# Patient Record
Sex: Male | Born: 1955 | Race: Black or African American | Hispanic: No | Marital: Single | Smoking: Former smoker
Health system: Southern US, Community
[De-identification: ages and names within clinical notes are randomized; demographics above are authoritative.]

## PROBLEM LIST (undated history)

## (undated) DIAGNOSIS — Z720 Tobacco use: Secondary | ICD-10-CM

## (undated) DIAGNOSIS — E785 Hyperlipidemia, unspecified: Secondary | ICD-10-CM

## (undated) DIAGNOSIS — M109 Gout, unspecified: Secondary | ICD-10-CM

## (undated) DIAGNOSIS — R040 Epistaxis: Secondary | ICD-10-CM

## (undated) DIAGNOSIS — I5042 Chronic combined systolic (congestive) and diastolic (congestive) heart failure: Secondary | ICD-10-CM

## (undated) DIAGNOSIS — F1010 Alcohol abuse, uncomplicated: Secondary | ICD-10-CM

## (undated) DIAGNOSIS — I4891 Unspecified atrial fibrillation: Secondary | ICD-10-CM

## (undated) DIAGNOSIS — I2510 Atherosclerotic heart disease of native coronary artery without angina pectoris: Secondary | ICD-10-CM

## (undated) DIAGNOSIS — I10 Essential (primary) hypertension: Secondary | ICD-10-CM

## (undated) DIAGNOSIS — I255 Ischemic cardiomyopathy: Secondary | ICD-10-CM

## (undated) DIAGNOSIS — I219 Acute myocardial infarction, unspecified: Secondary | ICD-10-CM

## (undated) HISTORY — DX: Atherosclerotic heart disease of native coronary artery without angina pectoris: I25.10

## (undated) HISTORY — DX: Alcohol abuse, uncomplicated: F10.10

## (undated) HISTORY — DX: Tobacco use: Z72.0

## (undated) HISTORY — DX: Gout, unspecified: M10.9

## (undated) HISTORY — PX: TONSILLECTOMY: SUR1361

## (undated) HISTORY — PX: ADENOIDECTOMY: SUR15

## (undated) HISTORY — PX: OTHER SURGICAL HISTORY: SHX169

## (undated) HISTORY — DX: Unspecified atrial fibrillation: I48.91

---

## 1998-05-12 ENCOUNTER — Emergency Department (HOSPITAL_COMMUNITY): Admission: EM | Admit: 1998-05-12 | Discharge: 1998-05-12 | Payer: Self-pay | Admitting: Emergency Medicine

## 1998-05-12 ENCOUNTER — Encounter: Payer: Self-pay | Admitting: Emergency Medicine

## 1998-06-02 ENCOUNTER — Emergency Department (HOSPITAL_COMMUNITY): Admission: EM | Admit: 1998-06-02 | Discharge: 1998-06-02 | Payer: Self-pay | Admitting: Emergency Medicine

## 1998-06-02 ENCOUNTER — Encounter: Payer: Self-pay | Admitting: Emergency Medicine

## 2006-02-20 ENCOUNTER — Emergency Department (HOSPITAL_COMMUNITY): Admission: EM | Admit: 2006-02-20 | Discharge: 2006-02-20 | Payer: Self-pay | Admitting: Emergency Medicine

## 2007-06-16 ENCOUNTER — Emergency Department (HOSPITAL_COMMUNITY): Admission: EM | Admit: 2007-06-16 | Discharge: 2007-06-16 | Payer: Self-pay | Admitting: Emergency Medicine

## 2008-11-15 ENCOUNTER — Emergency Department (HOSPITAL_COMMUNITY): Admission: EM | Admit: 2008-11-15 | Discharge: 2008-11-15 | Payer: Self-pay | Admitting: Emergency Medicine

## 2008-12-31 ENCOUNTER — Ambulatory Visit: Payer: Self-pay | Admitting: Family Medicine

## 2008-12-31 ENCOUNTER — Emergency Department (HOSPITAL_COMMUNITY): Admission: EM | Admit: 2008-12-31 | Discharge: 2008-12-31 | Payer: Self-pay | Admitting: Emergency Medicine

## 2009-05-03 ENCOUNTER — Emergency Department (HOSPITAL_COMMUNITY): Admission: EM | Admit: 2009-05-03 | Discharge: 2009-05-03 | Payer: Self-pay | Admitting: Emergency Medicine

## 2009-05-08 ENCOUNTER — Emergency Department (HOSPITAL_COMMUNITY): Admission: EM | Admit: 2009-05-08 | Discharge: 2009-05-08 | Payer: Self-pay | Admitting: Emergency Medicine

## 2009-05-09 ENCOUNTER — Emergency Department (HOSPITAL_COMMUNITY): Admission: EM | Admit: 2009-05-09 | Discharge: 2009-05-09 | Payer: Self-pay | Admitting: Emergency Medicine

## 2009-07-18 ENCOUNTER — Emergency Department (HOSPITAL_COMMUNITY): Admission: EM | Admit: 2009-07-18 | Discharge: 2009-07-19 | Payer: Self-pay | Admitting: Emergency Medicine

## 2009-10-28 ENCOUNTER — Emergency Department (HOSPITAL_COMMUNITY): Admission: EM | Admit: 2009-10-28 | Discharge: 2009-10-28 | Payer: Self-pay | Admitting: Emergency Medicine

## 2009-12-07 ENCOUNTER — Ambulatory Visit (HOSPITAL_COMMUNITY): Admission: RE | Admit: 2009-12-07 | Discharge: 2009-12-07 | Payer: Self-pay | Admitting: Family Medicine

## 2010-03-03 ENCOUNTER — Encounter: Payer: Self-pay | Admitting: Neurology

## 2010-04-29 LAB — DIFFERENTIAL
Basophils Absolute: 0.1 10*3/uL (ref 0.0–0.1)
Eosinophils Absolute: 0 10*3/uL (ref 0.0–0.7)
Eosinophils Relative: 0 % (ref 0–5)
Lymphs Abs: 1.8 10*3/uL (ref 0.7–4.0)
Neutrophils Relative %: 75 % (ref 43–77)

## 2010-04-29 LAB — CBC
MCV: 91.1 fL (ref 78.0–100.0)
Platelets: 219 10*3/uL (ref 150–400)
RDW: 16.4 % — ABNORMAL HIGH (ref 11.5–15.5)
WBC: 12.1 10*3/uL — ABNORMAL HIGH (ref 4.0–10.5)

## 2010-04-29 LAB — POCT I-STAT, CHEM 8
BUN: 12 mg/dL (ref 6–23)
Calcium, Ion: 1.11 mmol/L — ABNORMAL LOW (ref 1.12–1.32)
HCT: 46 % (ref 39.0–52.0)
Hemoglobin: 15.6 g/dL (ref 13.0–17.0)
Sodium: 134 mEq/L — ABNORMAL LOW (ref 135–145)
TCO2: 32 mmol/L (ref 0–100)

## 2010-04-29 LAB — URINE MICROSCOPIC-ADD ON

## 2010-04-29 LAB — URINALYSIS, ROUTINE W REFLEX MICROSCOPIC
Glucose, UA: 100 mg/dL — AB
Ketones, ur: 15 mg/dL — AB
Leukocytes, UA: NEGATIVE
Nitrite: NEGATIVE
Protein, ur: 100 mg/dL — AB
Urobilinogen, UA: 4 mg/dL — ABNORMAL HIGH (ref 0.0–1.0)

## 2010-04-29 LAB — URIC ACID: Uric Acid, Serum: 8.9 mg/dL — ABNORMAL HIGH (ref 4.0–7.8)

## 2010-05-06 LAB — BASIC METABOLIC PANEL
CO2: 29 mEq/L (ref 19–32)
Calcium: 9.7 mg/dL (ref 8.4–10.5)
Creatinine, Ser: 0.82 mg/dL (ref 0.4–1.5)
GFR calc Af Amer: >60 mL/min (ref >60)
GFR calc non Af Amer: >60 mL/min (ref >60)
Glucose, Bld: 144 mg/dL — ABNORMAL HIGH (ref 70–99)

## 2010-05-06 LAB — POCT I-STAT, CHEM 8
BUN: 26 mg/dL — ABNORMAL HIGH (ref 6–23)
Calcium, Ion: 1.09 mmol/L — ABNORMAL LOW (ref 1.12–1.32)
Creatinine, Ser: 0.8 mg/dL (ref 0.4–1.5)
Hemoglobin: 14.6 g/dL (ref 13.0–17.0)
Sodium: 136 mEq/L (ref 135–145)
TCO2: 30 mmol/L (ref 0–100)

## 2010-05-06 LAB — DIFFERENTIAL
Basophils Absolute: 0 10*3/uL (ref 0.0–0.1)
Basophils Absolute: 0.1 10*3/uL (ref 0.0–0.1)
Basophils Relative: 0 % (ref 0–1)
Eosinophils Absolute: 0.1 10*3/uL (ref 0.0–0.7)
Eosinophils Relative: 1 % (ref 0–5)
Lymphocytes Relative: 26 % (ref 12–46)
Lymphocytes Relative: 28 % (ref 12–46)
Monocytes Absolute: 1.4 10*3/uL — ABNORMAL HIGH (ref 0.1–1.0)
Neutro Abs: 5.3 10*3/uL (ref 1.7–7.7)
Neutrophils Relative %: 62 % (ref 43–77)

## 2010-05-06 LAB — CBC
HCT: 42.9 % (ref 39.0–52.0)
Hemoglobin: 14 g/dL (ref 13.0–17.0)
MCHC: 32.6 g/dL (ref 30.0–36.0)
MCHC: 33.3 g/dL (ref 30.0–36.0)
MCV: 91.4 fL (ref 78.0–100.0)
Platelets: 214 10*3/uL (ref 150–400)
RDW: 14.4 % (ref 11.5–15.5)
RDW: 14.5 % (ref 11.5–15.5)

## 2010-05-15 LAB — COMPREHENSIVE METABOLIC PANEL
ALT: 17 U/L (ref 0–53)
Alkaline Phosphatase: 72 U/L (ref 39–117)
CO2: 26 mEq/L (ref 19–32)
Chloride: 101 mEq/L (ref 96–112)
GFR calc non Af Amer: >60 mL/min (ref >60)
Glucose, Bld: 138 mg/dL — ABNORMAL HIGH (ref 70–99)
Potassium: 3.9 mEq/L (ref 3.5–5.1)
Sodium: 136 mEq/L (ref 135–145)
Total Bilirubin: 0.4 mg/dL (ref 0.3–1.2)
Total Protein: 7.5 g/dL (ref 6.0–8.3)

## 2010-05-15 LAB — DIFFERENTIAL
Basophils Absolute: 0 10*3/uL (ref 0.0–0.1)
Basophils Relative: 0 % (ref 0–1)
Eosinophils Absolute: 0.1 10*3/uL (ref 0.0–0.7)
Monocytes Relative: 9 % (ref 3–12)
Neutrophils Relative %: 73 % (ref 43–77)

## 2010-05-15 LAB — CBC
Hemoglobin: 13.4 g/dL (ref 13.0–17.0)
RBC: 4.49 MIL/uL (ref 4.22–5.81)
WBC: 8.3 10*3/uL (ref 4.0–10.5)

## 2010-09-11 ENCOUNTER — Emergency Department (HOSPITAL_COMMUNITY): Payer: Managed Care, Other (non HMO)

## 2010-09-11 ENCOUNTER — Observation Stay (HOSPITAL_COMMUNITY)
Admission: EM | Admit: 2010-09-11 | Discharge: 2010-09-12 | Disposition: A | Discharge status: Home / Self Care | Payer: Managed Care, Other (non HMO) | Attending: Internal Medicine | Admitting: Internal Medicine

## 2010-09-11 DIAGNOSIS — E119 Type 2 diabetes mellitus without complications: Secondary | ICD-10-CM | POA: Insufficient documentation

## 2010-09-11 DIAGNOSIS — R55 Syncope and collapse: Principal | ICD-10-CM | POA: Insufficient documentation

## 2010-09-11 DIAGNOSIS — I10 Essential (primary) hypertension: Secondary | ICD-10-CM | POA: Insufficient documentation

## 2010-09-11 DIAGNOSIS — M6282 Rhabdomyolysis: Secondary | ICD-10-CM | POA: Insufficient documentation

## 2010-09-11 DIAGNOSIS — Z9181 History of falling: Secondary | ICD-10-CM | POA: Insufficient documentation

## 2010-09-11 DIAGNOSIS — I517 Cardiomegaly: Secondary | ICD-10-CM | POA: Insufficient documentation

## 2010-09-11 DIAGNOSIS — I4949 Other premature depolarization: Secondary | ICD-10-CM | POA: Insufficient documentation

## 2010-09-11 DIAGNOSIS — F17200 Nicotine dependence, unspecified, uncomplicated: Secondary | ICD-10-CM | POA: Insufficient documentation

## 2010-09-11 DIAGNOSIS — F1020 Alcohol dependence, uncomplicated: Secondary | ICD-10-CM | POA: Insufficient documentation

## 2010-09-11 LAB — POCT I-STAT, CHEM 8
Glucose, Bld: 135 mg/dL — ABNORMAL HIGH (ref 70–99)
HCT: 48 % (ref 39.0–52.0)
Hemoglobin: 16.3 g/dL (ref 13.0–17.0)
Potassium: 4.1 mEq/L (ref 3.5–5.1)

## 2010-09-11 LAB — CBC
Platelets: 207 10*3/uL (ref 150–400)
RDW: 13.9 % (ref 11.5–15.5)
WBC: 7.2 10*3/uL (ref 4.0–10.5)

## 2010-09-11 LAB — GLUCOSE, CAPILLARY: Comment 1: 2525

## 2010-09-11 LAB — DIFFERENTIAL
Basophils Absolute: 0 10*3/uL (ref 0.0–0.1)
Eosinophils Absolute: 0.1 10*3/uL (ref 0.0–0.7)
Eosinophils Relative: 1 % (ref 0–5)
Lymphocytes Relative: 30 % (ref 12–46)

## 2010-09-11 LAB — ETHANOL: Alcohol, Ethyl (B): <11 mg/dL (ref 0–11)

## 2010-09-12 ENCOUNTER — Inpatient Hospital Stay (HOSPITAL_COMMUNITY): Payer: Managed Care, Other (non HMO)

## 2010-09-12 LAB — GLUCOSE, CAPILLARY
Glucose-Capillary: 90 mg/dL (ref 70–99)
Glucose-Capillary: 156 mg/dL — ABNORMAL HIGH (ref 70–99)
Glucose-Capillary: 89 mg/dL (ref 70–99)

## 2010-09-12 LAB — URINALYSIS, ROUTINE W REFLEX MICROSCOPIC
Leukocytes, UA: NEGATIVE
Nitrite: NEGATIVE
Specific Gravity, Urine: 1.009 (ref 1.005–1.030)
Urobilinogen, UA: 1 mg/dL (ref 0.0–1.0)

## 2010-09-12 LAB — BASIC METABOLIC PANEL
BUN: 14 mg/dL (ref 6–23)
Chloride: 104 mEq/L (ref 96–112)
Glucose, Bld: 121 mg/dL — ABNORMAL HIGH (ref 70–99)
Potassium: 3.7 mEq/L (ref 3.5–5.1)

## 2010-09-12 LAB — CBC
HCT: 39.8 % (ref 39.0–52.0)
Hemoglobin: 13.2 g/dL (ref 13.0–17.0)
MCH: 30.9 pg (ref 26.0–34.0)
MCHC: 33.4 g/dL (ref 30.0–36.0)

## 2010-09-12 LAB — TSH: TSH: 2.029 u[IU]/mL (ref 0.350–4.500)

## 2010-09-12 LAB — CARDIAC PANEL(CRET KIN+CKTOT+MB+TROPI): CK, MB: 6.5 ng/mL (ref 0.3–4.0)

## 2010-09-15 NOTE — Discharge Summary (Signed)
  Timothy Olsen, HICKLIN NO.:  [id number]  MEDICAL RECORD NO.:  [id number]  LOCATION:  4735                         FACILITY:  [facility]  PHYSICIAN:  Conley Canal, MD      DATE OF BIRTH:  10/01/1955  DATE OF ADMISSION:  09/11/2010 DATE OF DISCHARGE:  09/12/2010                        DISCHARGE SUMMARY - REFERRING   PRIMARY CARE PHYSICIAN:  Clyda Greener, MD  CONSULTING PHYSICIAN:  Dr. Kristeen Miss, Cardiology.  DISCHARGE DIAGNOSES: 1. Syncopal episode, likely vasovagal, possibility of orthostatic     hypotension and possible arrhythmia. 2. Rhabdomyolysis, related to fall, related to #1. 3. Premature ventricular complexes, chronic, in the setting of     alcoholism and hypomagnesemia. 4. Diabetes mellitus type 2. 5. Accelerated hypertension, poorly controlled.  DISCHARGE MEDICATIONS: 1. Aspirin 81 mg daily. 2. Lopressor 25 mg twice daily. 3. Clonidine 0.2 mg twice daily. 4. Glipizide 5 mg twice daily. 5. Lisinopril 10 mg daily. 6. Metformin 1000 mg twice daily.  PROCEDURES PERFORMED:  Chest x-ray showing cardiomegaly without congestive heart failure and some minimal residual right base subsegmental atelectasis.  HOSPITAL COURSE:  [name] was briefly admitted on August 1 with complaints of syncopal episode preceded by some dizziness.  The thought was that he probably had a presyncopal episode related to orthostatic hypotension as he was volume depleted with some rhabdomyolysis at admission.  The patient was seen by Cardiology who felt that he did not require further cardiac workup.  His CPK was 472, troponin I negative. He was then started on IV fluids.  He had also been on clonidine 0.2 mg twice daily, and this was restarted.  Magnesium level was also 1.4, contributing to PVCs.  He received magnesium sulfate.  His TSH was normal.  At this point, the thinking is that he probably had orthostatic hypotension or a combination of things including PVCs,  dehydration, possibly resulting in orthostatic hypotension.  He was not interested in further workup, but in this admission, his blood pressure was noted to be generally high, possibly a rebound effect of missing some doses of clonidine.  If his baseline blood pressure is this high, he will need to work with his primary care provider to optimize control.  Today, he is being discharged on clonidine 0.2 mg twice daily, Lopressor 25 mg twice daily as well as lisinopril 10 mg daily.  He should follow with Dr. Bruna Potter in the next week.  The time spent for this discharge preparation is less than 30 minutes.     Conley Canal, MD     SR/MEDQ  D:  09/12/2010  T:  09/12/2010  Job:  [id number]  cc:   Clyda Greener, MD  Electronically Signed by Conley Canal  on 09/15/2010 05:46:40 PM

## 2010-09-25 NOTE — Consult Note (Signed)
NAMEALTON, BOUKNIGHT NO.:  [id number]  MEDICAL RECORD NO.:  [id number]  LOCATION:  4735                         FACILITY:  [facility]  PHYSICIAN:  Vesta Mixer, M.D. DATE OF BIRTH:  [birth year]  DATE OF CONSULTATION:  09/11/2010 DATE OF DISCHARGE:                                CONSULTATION   PRIMARY CARE PHYSICIAN:  Dr. Clide Deutscher and the patient is also been seen at [facility].  PRIMARY CARDIOLOGIST:  None.  CHIEF COMPLAINT:  Syncope.  HISTORY OF PRESENT ILLNESS:  Timothy Olsen is a 55 year old male with no previous history of coronary artery disease.  He was at work today as usual but had indulged in tobacco and alcohol this weekend.  He was working and stated that that involved bending down repeatedly.  He developed dizziness with this and then when he went to do something else the dizziness continued.  He then had a brief syncopal episode.  There was no seizure activity witnessed.  No postictal state is described.  He had no palpitations and no chest pain.  His symptoms did not recur.  He was sent to Urgent Care and his EKG appeared abnormal, so they transferred him to [facility].  In the emergency room, Mr. Nix EKG has frequent PACs but no life-threatening arrhythmias.  He is aware of a long history of an irregular heart rate and says he has been evaluated for this before and was told there was nothing to it.  Currently, he is resting comfortably.  This has never happened before.  PAST MEDICAL HISTORY: 1. Diabetes. 2. Hypertension. 3. History of an irregular heart rate. 4. History of left foot cellulitis in November 2010. 5. Family history of coronary artery disease.  SURGICAL HISTORY:  Status post tonsillectomy and left thumb surgery.  ALLERGIES:  No known drug allergies.  CURRENT MEDICATIONS: 1. Clonidine 0.2 mg b.i.d. 2. Glucophage 1000 mg b.i.d. 3. Lisinopril 10 mg a day. 4. Glucotrol 5 mg b.i.d. 5. Lasix 20 mg p.r.n. 6.  Pravachol prescribed but the patient currently not taking.  SOCIAL HISTORY:  He lives in [place] alone.  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  He drinks alcohol and smokes occasionally on the weekends but not every day.  He denies drug abuse.  FAMILY HISTORY:  His mother died of 67 and the patient is not aware of any heart disease.  His father has a history of heart disease and is alive at 55.  He also has at least one sibling with cardiac issues.  REVIEW OF SYSTEMS:  Mr. Atchley has not had any recent illnesses, fevers, or chills.  He does not have any history of chest pain or dyspnea on exertion.  He has chronic left lower extremity edema ever since the cellulitis in 2010.  He does not have numbness or tingling. He has not been weak.  He has occasional arthralgias and joint pains. He rarely has indigestion or heartburn and has no history of melena. Full 14-point review of systems is otherwise negative except as stated in the HPI.  PHYSICAL EXAMINATION:  VITAL SIGNS:  Temperature is 97.9, blood pressure 166/88, heart rate 87, respiratory rate 20, O2 saturation  97% on room air. GENERAL:  He is a well-developed, well-nourished African American male in no acute distress. HEENT:  Normal with the exception of poor dentition. NECK:  There is no lymphadenopathy, thyromegaly, bruit or JVD noted. CV:  His heart is regular in rate and rhythm with an S1-S2 with no significant murmur, rub or gallop is noted.  Distal pulses are intact in all four extremities. LUNGS:  Essentially clear to auscultation bilaterally but he had some basilar rales that cleared with deep inspiration. SKIN:  He has some chronic stasis changes in his lower extremities but no rashes or lesions are noted. ABDOMEN:  Soft and nontender with active bowel sounds. EXTREMITIES:  There is no cyanosis or clubbing and only trace edema on the left. MUSCULOSKELETAL:  There is no joint deformity or effusions and no  spine or CVA tenderness. NEURO:  He is alert and oriented with cranial nerves II-XII grossly intact.  Chest x-ray shows decreased lung volumes with bibasilar airspace disease.  EKG is sinus rhythm rate 82 with multiple PACs.  LABORATORY VALUES:  Hemoglobin 15.2, hematocrit 44.6, WBC 7.2, platelets 207.  Sodium 141, potassium 4.1, chloride 104, BUN 10, creatinine 0.8, glucose 135, CK-MB 472/11.1 with an index of 2.4 and troponin I less than 0.3 with a blood alcohol less than 11.  IMPRESSION:  Mr. Mineer was seen today by Dr. Elease Hashimoto, the patient evaluated and the data reviewed.  He has a history of hypertension who had presyncope and then syncope today.  His symptoms are consistent with orthostatic hypotension.  His enzymes are negative because although the total CK is up, the index is normal, and consistent with volume dehydration.  The troponin is within normal limits.  Therefore, the elevated MB is not felt to be primarily cardiac in origin.  His EKG has left ventricular hypertrophy but no acute ischemic changes.  Mr. Maull may be discharged from a cardiac standpoint.  He is encouraged to follow up with primary MD, and the patient has decided to stop drinking and smoking altogether which he was encouraged to do.  No cardiac followup is indicated at this time but we concur that it would be appropriate for Mr. Grieshop to rest tomorrow and return to work as usual on Friday with no restrictions.     Theodore Demark, PA-C   ______________________________ Vesta Mixer, M.D.    RB/MEDQ  D:  09/11/2010  T:  09/12/2010  Job:  [id number]  Electronically Signed by Theodore Demark PA-C on 09/19/2010 02:11:33 PM Electronically Signed by Kristeen Miss M.D. on 09/25/2010 05:36:28 PM

## 2010-11-07 NOTE — H&P (Signed)
NAMEAMARI, ZAGAL NO.:  [id number]  MEDICAL RECORD NO.:  [id number]  LOCATION:  4735                         FACILITY:  [facility]  PHYSICIAN:  Lonia Blood, M.D.DATE OF BIRTH:  [birth year]  DATE OF ADMISSION:  09/11/2010 DATE OF DISCHARGE:                             HISTORY & PHYSICAL   PRIMARY CARE PHYSICIAN:  Clyda Greener, MD  CHIEF COMPLAINT:  Syncope with preceding dizziness.  HISTORY OF PRESENT ILLNESS:  Timothy Olsen is a 55 year old gentleman with a prior history of diabetes, hypertension, and known "irregular heartbeat."  He has been taking all of his usual prescribed medications and has had no major acute issues of late.  Today, he was working at [employer].  He was bending over and standing backup promptly multiple times in succession.  At one point, after having done so, he immediately developed severe dizziness after standing.  He became quite lightheaded.  Immediately thereafter, he slumped over a counter at work and became unconscious.  Other staff noted his predicament and lowered him to the floor.  He quickly awoke. There was some concern of some convulsions while the patient was unresponsive.  The patient did rapidly awake.  He was alert and oriented after doing so.  He denies any chest pains, fevers, chills, nausea, or vomiting.  The patient originally was taken to a local urgent care. There, they became quite concerned because of his "irregular rhythm," and they send him to the emergency room.  In the emergency room, the ER doctor was concerned about his irregular cardiac rhythm and his syncopal spells and consulted the cardiologist.  The cardiologist evaluated the patient, made note of his EKG, examined the patient, took a complete history, and then suggested that it was safe for the patient to be discharged home.  At this point, the ER then contacted the hospitalist on-call to request direct admission to  the floor.  Due to a significant high volume of admissions, the Hospitalist was not able to evaluate the patient in the emergency room in a timely manner, and he was therefore transferred out to the floor.  At the time of my evaluation, the patient is resting comfortably in his hospital bed on 4735.  He continues to report that he is having no chest pain, shortness of breath, fevers, chills, nausea, or vomiting.  He is not having any dizzy spells at the present time.  He admits to compliance with his medications and does in fact admit that he has been "drinking a little too much beer lately."  He has informed me of his plan to discontinue using alcohol.  REVIEW OF SYSTEMS:  Comprehensive review of systems is accomplished and is unrevealing with the exception to the positive elements noted in the history of present illness above.  PAST MEDICAL HISTORY: 1. Diabetes mellitus type 2. 2. Hypertension. 3. "Irregular heartbeat" - frequent PACs and occasional PVCs noted on     old EKGs and in the ER. 4. Status post tonsillectomy and adenoidectomy.  OUTPATIENT MEDICATIONS: 1. Clonidine 0.2 mg b.i.d. 2. Glucophage 1000 mg b.i.d. 3. Lisinopril 10 mg b.i.d. 4. Glucotrol 5 mg b.i.d.  ALLERGIES:  No known drug allergies.  FAMILY HISTORY:  The patient's mother has passed away at the age of 32, she had no history of coronary artery disease.  The patient's father is alive and well at age 9, but does have a personal history of coronary artery disease.  SOCIAL HISTORY:  The patient works at [employer].  He occasionally partakes of alcohol, but cannot specify to me exactly how much.  He lives alone in the [place] area.  He occasionally smokes but not on a daily basis per his report.  DATA REVIEW:  Sodium, potassium, chloride, bicarb, BUN, and creatinine are normal.  Serum glucose is elevated at 135.  White count, platelet count, and hemoglobin are normal with a normal MCV.  Alcohol  level was less 11.  CK is elevated at 472, CK-MB is elevated at 11.1, troponin is however totally normal.  Chest x-ray reveals bibasilar airspace disease which is most consistent with poor inspiratory effort and atelectasis. A 12-lead EKG reveals sinus rhythm with PVCs.  PHYSICAL EXAMINATION:  VITAL SIGNS:  Temperature 97.9, blood pressure 166/88, heart rate 87, respiratory rate 20, O2 sats 97% on room air. GENERAL:  Well-developed, well-nourished gentleman, in no acute respiratory distress. HEENT:  Normocephalic, atraumatic.  Pupils are equal, round, and reactive to light and accommodation.  Extraocular muscles intact. Bilaterally, OC/OP clear. NECK:  No JVD. LUNGS:  Clear to auscultation bilaterally without wheeze or rhonchi. CARDIOVASCULAR:  Regular rate and rhythm with frequent ectopic beats without gallop or rub with no appreciable murmur. ABDOMEN:  Nontender, nondistended, soft bowel sounds present.  No organomegaly.  No rebound.  No ascites. EXTREMITIES:  There is no significant cyanosis, clubbing, edema in the bilateral lower extremities. NEUROLOGIC:  Nonfocal neurologic exam.  IMPRESSION AND PLAN: 1. Syncopal spell - again, I agree wholeheartedly with Cardiology that     these symptoms are almost consistent with an orthostatic     hypotension, specifically in the setting of frequent postural     changes in a short period of time.  Additionally, the patient is on     clonidine at 0.2 mg b.i.d.  Clonidine is known to exacerbate or     even purely cause orthostatic hypotension.  Additionally, the     patient does appear to be somewhat clinically dehydrated.  We will     hydrate the patient.  We would discontinue his clonidine.  We will     follow him on telemetry overnight and fully anticipate that he will     be prepared for discharge in the morning. 2. Mild myositis - the patient's CK total is elevated at 472.  His MB     is not significantly elevated in the setting of his  total CK     elevation.  His troponin is unrevealing.  He has no symptoms to     suggest unstable angina pectoris.  This is likely simple myositis     related to the patient's syncopal spell.  We will recheck a CK in     the morning to assure that this is trending down and not indicative     of rhabdomyolysis. 3. Premature atrial contractions and premature ventricular     contractions - the patient has a longstanding history per his     report of "irregular heartbeat."  I suspect his chronic arrhythmia     is that of PACs and PVCs.  We will monitor him on telemetry     overnight since he is being observed in the hospital.  Perhaps, the     patient could benefit from an echocardiogram in the outpatient     setting as per his primary care physician. 4. Diabetes mellitus - we will continue the patient's home     medications.  We will administer a diabetic diet.  We will follow     CBCs closely and supplement with sliding scale insulin as     indicated. 5. Hypertension - as discussed above, we will discontinue clonidine     due to its propensity to exacerbate or even cause orthostatic     hypotension.  We will continue lisinopril in this diabetic.  We     will add Norvasc as I do wish to avoid diuretic.     Lonia Blood, M.D.     JTM/MEDQ  D:  09/11/2010  T:  09/11/2010  Job:  [id number]  cc:   Clyda Greener, MD  Electronically Signed by Jetty Duhamel M.D. on 11/07/2010 09:46:18 AM

## 2011-01-06 ENCOUNTER — Encounter: Payer: Self-pay | Admitting: *Deleted

## 2011-01-06 ENCOUNTER — Emergency Department (HOSPITAL_COMMUNITY)
Admission: EM | Admit: 2011-01-06 | Discharge: 2011-01-07 | Disposition: A | Discharge status: Home / Self Care | Payer: Managed Care, Other (non HMO) | Attending: Emergency Medicine | Admitting: Emergency Medicine

## 2011-01-06 DIAGNOSIS — M109 Gout, unspecified: Secondary | ICD-10-CM | POA: Insufficient documentation

## 2011-01-06 DIAGNOSIS — E119 Type 2 diabetes mellitus without complications: Secondary | ICD-10-CM | POA: Insufficient documentation

## 2011-01-06 DIAGNOSIS — E162 Hypoglycemia, unspecified: Secondary | ICD-10-CM

## 2011-01-06 DIAGNOSIS — I10 Essential (primary) hypertension: Secondary | ICD-10-CM | POA: Insufficient documentation

## 2011-01-06 HISTORY — DX: Essential (primary) hypertension: I10

## 2011-01-06 LAB — GLUCOSE, CAPILLARY: Glucose-Capillary: 159 mg/dL — ABNORMAL HIGH (ref 70–99)

## 2011-01-06 NOTE — ED Notes (Signed)
Received pt. From triage with c/o  Bilat. Knee pain and swelling, Bilat. Foot pain, low blood sugar in triage improved with diet

## 2011-01-06 NOTE — ED Notes (Signed)
He has had feet pain and heaviness and he has a pain in his lt knee and his rt elbow for 3-4 weeks.  No known injury and he has been given  Med for gout from his doctor.

## 2011-01-07 MED ORDER — INDOMETHACIN 25 MG PO CAPS: 25.0000 mg | ORAL_CAPSULE | Freq: Three times a day (TID) | ORAL | Status: DC

## 2011-01-09 NOTE — ED Provider Notes (Signed)
Medical screening examination/treatment/procedure(s) were performed by non-physician practitioner and as supervising physician I was immediately available for consultation/collaboration.  Raeford Razor, MD 01/09/11 [phone]

## 2011-01-21 ENCOUNTER — Emergency Department (HOSPITAL_COMMUNITY): Payer: Managed Care, Other (non HMO)

## 2011-01-21 ENCOUNTER — Observation Stay (HOSPITAL_COMMUNITY)
Admission: EM | Admit: 2011-01-21 | Discharge: 2011-01-24 | DRG: 313 | Disposition: A | Discharge status: Home / Self Care | Payer: Managed Care, Other (non HMO) | Attending: Internal Medicine | Admitting: Internal Medicine

## 2011-01-21 ENCOUNTER — Other Ambulatory Visit: Payer: Self-pay

## 2011-01-21 ENCOUNTER — Encounter (HOSPITAL_COMMUNITY): Payer: Self-pay | Admitting: *Deleted

## 2011-01-21 DIAGNOSIS — M2540 Effusion, unspecified joint: Secondary | ICD-10-CM | POA: Diagnosis not present

## 2011-01-21 DIAGNOSIS — I10 Essential (primary) hypertension: Secondary | ICD-10-CM

## 2011-01-21 DIAGNOSIS — E119 Type 2 diabetes mellitus without complications: Secondary | ICD-10-CM

## 2011-01-21 DIAGNOSIS — Z7982 Long term (current) use of aspirin: Secondary | ICD-10-CM

## 2011-01-21 DIAGNOSIS — Z79899 Other long term (current) drug therapy: Secondary | ICD-10-CM | POA: Insufficient documentation

## 2011-01-21 DIAGNOSIS — I2510 Atherosclerotic heart disease of native coronary artery without angina pectoris: Secondary | ICD-10-CM | POA: Diagnosis present

## 2011-01-21 DIAGNOSIS — R079 Chest pain, unspecified: Secondary | ICD-10-CM

## 2011-01-21 DIAGNOSIS — I517 Cardiomegaly: Secondary | ICD-10-CM | POA: Insufficient documentation

## 2011-01-21 DIAGNOSIS — Z87891 Personal history of nicotine dependence: Secondary | ICD-10-CM

## 2011-01-21 DIAGNOSIS — M25469 Effusion, unspecified knee: Secondary | ICD-10-CM | POA: Diagnosis present

## 2011-01-21 DIAGNOSIS — E785 Hyperlipidemia, unspecified: Secondary | ICD-10-CM

## 2011-01-21 DIAGNOSIS — M109 Gout, unspecified: Secondary | ICD-10-CM | POA: Diagnosis present

## 2011-01-21 LAB — POCT I-STAT, CHEM 8
BUN: 10 mg/dL (ref 6–23)
Calcium, Ion: 1.22 mmol/L (ref 1.12–1.32)
Chloride: 101 mEq/L (ref 96–112)
Creatinine, Ser: 0.9 mg/dL (ref 0.50–1.35)
Glucose, Bld: 138 mg/dL — ABNORMAL HIGH (ref 70–99)

## 2011-01-21 LAB — PROTIME-INR
INR: 1.03 (ref 0.00–1.49)
Prothrombin Time: 13.7 seconds (ref 11.6–15.2)

## 2011-01-21 LAB — DIFFERENTIAL
Basophils Absolute: 0 10*3/uL (ref 0.0–0.1)
Basophils Relative: 0 % (ref 0–1)
Lymphocytes Relative: 12 % (ref 12–46)
Neutro Abs: 7.9 10*3/uL — ABNORMAL HIGH (ref 1.7–7.7)
Neutrophils Relative %: 79 % — ABNORMAL HIGH (ref 43–77)

## 2011-01-21 LAB — CBC
Hemoglobin: 14.3 g/dL (ref 13.0–17.0)
MCHC: 33.2 g/dL (ref 30.0–36.0)
RDW: 14.4 % (ref 11.5–15.5)
WBC: 10.1 10*3/uL (ref 4.0–10.5)

## 2011-01-21 LAB — CARDIAC PANEL(CRET KIN+CKTOT+MB+TROPI)
CK, MB: 4.2 ng/mL — ABNORMAL HIGH (ref 0.3–4.0)
Relative Index: 3 — ABNORMAL HIGH (ref 0.0–2.5)
Relative Index: 3.1 — ABNORMAL HIGH (ref 0.0–2.5)
Total CK: 175 U/L (ref 7–232)
Total CK: 134 U/L (ref 7–232)
Troponin I: <0.3 ng/mL (ref <0.30)

## 2011-01-21 LAB — GLUCOSE, CAPILLARY: Glucose-Capillary: 88 mg/dL (ref 70–99)

## 2011-01-21 LAB — APTT: aPTT: 31 seconds (ref 24–37)

## 2011-01-21 LAB — RAPID URINE DRUG SCREEN, HOSP PERFORMED
Amphetamines: NOT DETECTED
Benzodiazepines: NOT DETECTED
Cocaine: NOT DETECTED

## 2011-01-21 LAB — PHOSPHORUS: Phosphorus: 3.1 mg/dL (ref 2.3–4.6)

## 2011-01-21 LAB — CREATININE, SERUM
GFR calc Af Amer: >90 mL/min (ref >90)
GFR calc non Af Amer: >90 mL/min (ref >90)

## 2011-01-21 LAB — MAGNESIUM: Magnesium: 1.7 mg/dL (ref 1.5–2.5)

## 2011-01-21 MED ORDER — ONDANSETRON HCL 4 MG PO TABS: 4.0000 mg | ORAL_TABLET | Freq: Four times a day (QID) | ORAL | Status: DC | PRN

## 2011-01-21 MED ORDER — ALPRAZOLAM 0.25 MG PO TABS: 0.2500 mg | ORAL_TABLET | Freq: Three times a day (TID) | ORAL | Status: DC | PRN

## 2011-01-21 MED ORDER — FENTANYL CITRATE 0.05 MG/ML IJ SOLN
100.0000 ug | Freq: Once | INTRAMUSCULAR | Status: AC
  Administered 2011-01-21: 100 ug via INTRAVENOUS
  Filled 2011-01-21: qty 2

## 2011-01-21 MED ORDER — ENOXAPARIN SODIUM 30 MG/0.3ML ~~LOC~~ SOLN: 30.0000 mg | SUBCUTANEOUS | Status: DC

## 2011-01-21 MED ORDER — METOPROLOL TARTRATE 50 MG PO TABS
50.0000 mg | ORAL_TABLET | Freq: Two times a day (BID) | ORAL | Status: DC
  Administered 2011-01-21 – 2011-01-23 (×3): 50 mg via ORAL
  Filled 2011-01-21 (×5): qty 1

## 2011-01-21 MED ORDER — SODIUM CHLORIDE 0.9 % IJ SOLN
3.0000 mL | Freq: Two times a day (BID) | INTRAMUSCULAR | Status: DC
  Administered 2011-01-21: 3 mL via INTRAVENOUS

## 2011-01-21 MED ORDER — MORPHINE SULFATE 4 MG/ML IJ SOLN: 4.0000 mg | Freq: Once | INTRAMUSCULAR | Status: DC

## 2011-01-21 MED ORDER — INSULIN ASPART 100 UNIT/ML ~~LOC~~ SOLN
0.0000 [IU] | Freq: Three times a day (TID) | SUBCUTANEOUS | Status: DC
  Administered 2011-01-23: 3 [IU] via SUBCUTANEOUS
  Filled 2011-01-21: qty 3

## 2011-01-21 MED ORDER — SIMVASTATIN 20 MG PO TABS
20.0000 mg | ORAL_TABLET | Freq: Every day | ORAL | Status: DC
  Administered 2011-01-23: 20 mg via ORAL
  Filled 2011-01-21 (×3): qty 1

## 2011-01-21 MED ORDER — SODIUM CHLORIDE 0.9 % IV SOLN: 250.0000 mL | INTRAVENOUS | Status: DC | PRN

## 2011-01-21 MED ORDER — ACETAMINOPHEN 325 MG PO TABS: 650.0000 mg | ORAL_TABLET | Freq: Four times a day (QID) | ORAL | Status: DC | PRN

## 2011-01-21 MED ORDER — ASPIRIN 81 MG PO CHEW
81.0000 mg | CHEWABLE_TABLET | Freq: Every day | ORAL | Status: DC
  Administered 2011-01-21: 81 mg via ORAL
  Filled 2011-01-21 (×2): qty 1

## 2011-01-21 MED ORDER — SODIUM CHLORIDE 0.9 % IJ SOLN: 3.0000 mL | INTRAMUSCULAR | Status: DC | PRN

## 2011-01-21 MED ORDER — ONDANSETRON HCL 4 MG/2ML IJ SOLN: 4.0000 mg | Freq: Four times a day (QID) | INTRAMUSCULAR | Status: DC | PRN

## 2011-01-21 MED ORDER — ACETAMINOPHEN 650 MG RE SUPP: 650.0000 mg | Freq: Four times a day (QID) | RECTAL | Status: DC | PRN

## 2011-01-21 MED ORDER — HEPARIN BOLUS VIA INFUSION
4000.0000 [IU] | Freq: Once | INTRAVENOUS | Status: AC
  Administered 2011-01-21: 4000 [IU] via INTRAVENOUS
  Filled 2011-01-21: qty 4000

## 2011-01-21 MED ORDER — GLIPIZIDE 5 MG PO TABS
5.0000 mg | ORAL_TABLET | Freq: Two times a day (BID) | ORAL | Status: DC
  Administered 2011-01-21 – 2011-01-23 (×3): 5 mg via ORAL
  Filled 2011-01-21 (×5): qty 1

## 2011-01-21 MED ORDER — ONDANSETRON HCL 4 MG/2ML IJ SOLN
4.0000 mg | Freq: Once | INTRAMUSCULAR | Status: AC
  Administered 2011-01-21: 4 mg via INTRAVENOUS
  Filled 2011-01-21: qty 2

## 2011-01-21 MED ORDER — HEPARIN SOD (PORCINE) IN D5W 100 UNIT/ML IV SOLN
1400.0000 [IU]/h | INTRAVENOUS | Status: DC
  Administered 2011-01-21: 1350 [IU]/h via INTRAVENOUS
  Administered 2011-01-22 – 2011-01-23 (×2): 1400 [IU]/h via INTRAVENOUS
  Filled 2011-01-21 (×4): qty 250

## 2011-01-21 MED ORDER — ENOXAPARIN SODIUM 60 MG/0.6ML ~~LOC~~ SOLN
60.0000 mg | Freq: Every day | SUBCUTANEOUS | Status: DC
  Filled 2011-01-21 (×2): qty 0.6

## 2011-01-21 MED ORDER — SODIUM CHLORIDE 0.9 % IJ SOLN: 3.0000 mL | Freq: Two times a day (BID) | INTRAMUSCULAR | Status: DC

## 2011-01-21 MED ORDER — DOCUSATE SODIUM 100 MG PO CAPS
100.0000 mg | ORAL_CAPSULE | Freq: Two times a day (BID) | ORAL | Status: DC
  Administered 2011-01-21 – 2011-01-23 (×3): 100 mg via ORAL
  Filled 2011-01-21 (×5): qty 1

## 2011-01-21 MED ORDER — LISINOPRIL 20 MG PO TABS
20.0000 mg | ORAL_TABLET | Freq: Every day | ORAL | Status: DC
  Administered 2011-01-21 – 2011-01-22 (×2): 20 mg via ORAL
  Administered 2011-01-23: 14:00:00
  Filled 2011-01-21 (×2): qty 1

## 2011-01-21 MED ORDER — NITROGLYCERIN 0.4 MG SL SUBL
0.4000 mg | SUBLINGUAL_TABLET | SUBLINGUAL | Status: DC | PRN
  Administered 2011-01-21 (×2): 0.4 mg via SUBLINGUAL
  Filled 2011-01-21: qty 25

## 2011-01-21 MED ORDER — ASPIRIN EC 325 MG PO TBEC
325.0000 mg | DELAYED_RELEASE_TABLET | Freq: Every day | ORAL | Status: DC
  Administered 2011-01-22 – 2011-01-23 (×2): 325 mg via ORAL
  Filled 2011-01-21 (×3): qty 1

## 2011-01-21 MED ORDER — MORPHINE SULFATE 10 MG/ML IJ SOLN
INTRAMUSCULAR | Status: AC
  Administered 2011-01-21: 4 mg
  Filled 2011-01-21: qty 1

## 2011-01-21 MED ORDER — SODIUM CHLORIDE 0.9 % IV SOLN
INTRAVENOUS | Status: DC
  Administered 2011-01-21 – 2011-01-22 (×3): via INTRAVENOUS

## 2011-01-21 MED ORDER — HYDROMORPHONE HCL PF 1 MG/ML IJ SOLN
1.0000 mg | INTRAMUSCULAR | Status: DC | PRN
  Administered 2011-01-21 – 2011-01-22 (×3): 1 mg via INTRAVENOUS
  Filled 2011-01-21 (×5): qty 1

## 2011-01-21 NOTE — ED Notes (Signed)
Pain worse with movement.

## 2011-01-21 NOTE — ED Provider Notes (Signed)
History     CSN: [id number] Arrival date & time: 01/21/2011 10:57 AM   First MD Initiated Contact with Patient 01/21/11 1134      Chief Complaint  Patient presents with  . Chest Pain    (Consider location/radiation/quality/duration/timing/severity/associated sxs/prior treatment) Patient is a 55 y.o. male presenting with chest pain. The history is provided by the patient.  Chest Pain The chest pain began 5 - 7 days ago. Pertinent negatives for primary symptoms include no shortness of breath, no abdominal pain, no nausea and no vomiting.  Pertinent negatives for associated symptoms include no numbness and no weakness.    patient states he developed sharp chest pain and it comes and goes from his left lower chest after getting flu vaccine on Friday. No nausea vomiting or shortness of breath. No diaphoresis. Is worse with movement. No dyspnea. He has not had pains like this before. No fevers. Nothing makes the pain better. Movement makes the pain worse. Abdominal pain.  Past Medical History  Diagnosis Date  . Hypertension   . Diabetes mellitus   . Coronary artery disease     History reviewed. No pertinent past surgical history.  No family history on file.  History  Substance Use Topics  . Smoking status: Former Games developer  . Smokeless tobacco: Not on file  . Alcohol Use: Yes     rarely      Review of Systems  Constitutional: Negative for activity change and appetite change.  HENT: Negative for neck stiffness.   Eyes: Negative for pain.  Respiratory: Negative for chest tightness and shortness of breath.   Cardiovascular: Positive for chest pain. Negative for leg swelling.  Gastrointestinal: Negative for nausea, vomiting, abdominal pain and diarrhea.  Genitourinary: Negative for flank pain.  Musculoskeletal: Negative for back pain.  Skin: Negative for rash.  Neurological: Negative for weakness, numbness and headaches.  Psychiatric/Behavioral: Negative for behavioral  problems.    Allergies  Review of patient's allergies indicates no known allergies.  Home Medications   Current Outpatient Rx  Name Route Sig Dispense Refill  . ASPIRIN EC 81 MG PO TBEC Oral Take 81 mg by mouth daily.      Marland Kitchen GLIPIZIDE 5 MG PO TABS Oral Take 5 mg by mouth 2 (two) times daily before a meal.      . INDOMETHACIN 25 MG PO CAPS Oral Take 25 mg by mouth 3 (three) times daily as needed. For pain     . LISINOPRIL 20 MG PO TABS Oral Take 20 mg by mouth daily.      Marland Kitchen METFORMIN HCL 500 MG PO TABS Oral Take 1,000 mg by mouth 2 (two) times daily with a meal.      . METOPROLOL TARTRATE 50 MG PO TABS Oral Take 50 mg by mouth 2 (two) times daily.      Marland Kitchen PRAVASTATIN SODIUM 40 MG PO TABS Oral Take 40 mg by mouth once.        BP 151/71  Pulse 87  Temp(Src) 98.5 F (36.9 C) (Oral)  Resp 18  SpO2 98%  Physical Exam  Nursing note and vitals reviewed. Constitutional: He is oriented to person, place, and time. He appears well-developed and well-nourished.  HENT:  Head: Normocephalic and atraumatic.  Eyes: EOM are normal. Pupils are equal, round, and reactive to light.  Neck: Normal range of motion. Neck supple.  Cardiovascular: Normal rate, regular rhythm and normal heart sounds.   No murmur heard. Pulmonary/Chest: Effort normal and breath sounds normal.  Abdominal: Soft. Bowel sounds are normal. He exhibits no distension and no mass. There is no tenderness. There is no rebound and no guarding.  Musculoskeletal: Normal range of motion. He exhibits no edema.       Pain with movement of left chest. No tenderness. No rash. No crepitance  Neurological: He is alert and oriented to person, place, and time. No cranial nerve deficit.  Skin: Skin is warm and dry.  Psychiatric: He has a normal mood and affect.    ED Course  Procedures (including critical care time)  Labs Reviewed  DIFFERENTIAL - Abnormal; Notable for the following:    Neutrophils Relative 79 (*)    Neutro Abs 7.9 (*)     All other components within normal limits  POCT I-STAT, CHEM 8 - Abnormal; Notable for the following:    Glucose, Bld 138 (*)    All other components within normal limits  CBC  URINE RAPID DRUG SCREEN (HOSP PERFORMED)  TROPONIN I  I-STAT, CHEM 8   Dg Ribs Unilateral W/chest Left  01/21/2011  *RADIOLOGY REPORT*  Clinical Data: Left-sided chest pain for 4 days.  No known injury.  LEFT RIBS AND CHEST - 3+ VIEW  Comparison: 09/12/2010 chest film.  Findings: Frontal view of the chest demonstrates a midline trachea. Mild cardiomegaly. Mediastinal contours otherwise within normal limits.  No pleural effusion or pneumothorax.  Clear lungs.  4 views of left sided ribs.  Lower thoracic spondylosis is advanced.  No displaced rib fracture.  IMPRESSION: No displaced rib fracture or pneumothorax.  Mild cardiomegaly, without congestive failure.  Original Report Authenticated By: Consuello Bossier, M.D.     1. Chest pain      Date: 01/21/2011  Rate:77  Rhythm: normal sinus rhythm, premature atrial contractions (PAC) and premature ventricular contractions (PVC)  QRS Axis: normal  Intervals: normal  ST/T Wave abnormalities: nonspecific ST/T changes  Conduction Disutrbances:none  Narrative Interpretation: new ST and T wave changes since last EKG  Old EKG Reviewed: changes noted    MDM  Patient presents with sharp left sided chest pain worse with movement. He states started after getting flu shot. He is diabetic. His EKG shows ST and T wave changes laterally and appear that were not there previously. Overall his story is not consistent with ischemic pain, with the EKG changes she'll is a likely admission. I discussed it with triad.        Juliet Rude. Rubin Payor, MD 01/21/11 1339

## 2011-01-21 NOTE — Consult Note (Signed)
Cardiology Consult Note   Patient ID: Timothy Olsen MRN: [id number], DOB/AGE: September 25, 1955   Admit date: 01/21/2011 Date of Consult: 01/21/2011  Primary Physician: Lillia Carmel, MD, MD Primary Cardiologist: Kristeen Miss, MD  Pt. Profile: Timothy Olsen is a 55 yo AA male without previous cardiac hx or workup with PMHx significant for type 2 DM, HTN and HL who presented to [facility] ED with chest pain.   Problem List: Past Medical History  Diagnosis Date  . Hypertension   . Diabetes mellitus   . Coronary artery disease   . Irregular heartbeat     Past Surgical History  Procedure Date  . Tonsillectomy   . Left thumb surgery   . Adenoidectomy      Allergies: No Known Allergies  HPI:   He reports a 5 day history of left-sided chest pain described as sharp, intermittent and radiating to his left side rated at a 10/10 at worst and 6/10 currently. Aggravated by positional changes, denies aggravation upon laying flat or inspiration. No alleviating factors. He endorses diaphoresis. Denies SOB, DOE, lightheadedness, orthopnea, edema, PND, fevers, chills, new rash, hx of reflux, sick contacts or extended travel.   In the ED, EKG reveals NSR with PVCs, 77bpm, TWI V4-V6, II, III and aVF. TnI neg x 2; CK-MB elevated at 5.3. CBC, BMET WNL. UDS neg. Left ribs and CXR reveal mild cardiomegaly, no evidence of edema, displaced rib fracture or pneumothorax. Echo is pending.   Of note, pt reports a "known irregular heartbeat" but does not recall a formal diagnosis. He was admitted and discharged in 08/12 for vasovagal syncope.    Inpatient Medications:     . fentaNYL  100 mcg Intravenous Once  . insulin aspart  0-9 Units Subcutaneous TID WC  . morphine      . ondansetron  4 mg Intravenous Once  . DISCONTD:  morphine injection  4 mg Intravenous Once   Outpatient Medications:   ASA 81mg  daily Glipizide 5mg  BID Indomethacin 25mg  daily PRN Lisinopril 20mg  daily Metformin 1000mg  BID  ac Lopressor 50 mg BID Pravastatin 20mg  daily  Family History  Problem Relation Age of Onset  . Diabetes type II Mother   . Hypertension Father      History   Social History  . Marital Status: Single    Spouse Name: N/A    Number of Children: N/A  . Years of Education: N/A   Occupational History  . Not on file.   Social History Main Topics  . Smoking status: Former Smoker -- 0.5 packs/day for 2 years    Types: Cigarettes    Quit date:   . Smokeless tobacco: Never Used  . Alcohol Use: 3.0 oz/week    6 drink(s) per week     rarely  . Drug Use: No  . Sexually Active: No   Other Topics Concern  . Not on file   Social History Narrative  . No narrative on file     Review of Systems: General: positive night sweats, negative for chills, fever Cardiovascular: positive for chest pain, negative for dyspnea on exertion, edema, orthopnea, paroxysmal nocturnal dyspnea or shortness of breath Dermatological: negative for rash Respiratory: negative for cough or wheezing Urologic: negative for hematuria Abdominal: negative for nausea, vomiting, diarrhea, bright red blood per rectum, melena, or hematemesis Neurologic: negative for visual changes, syncope, or dizziness All other systems reviewed and are otherwise negative except as noted above.  Physical Exam: Blood pressure 151/71, pulse 87, temperature 98.5 F (  36.9 C), temperature source Oral, resp. rate 18, SpO2 98.00%.   General: Well developed, well nourished, NAD Head: Normocephalic, atraumatic, sclera non-icteric Neck: Negative for carotid bruits. JVD not elevated. Supple. Lungs: Clear bilaterally to auscultation without wheezes, rales, or rhonchi. Breathing is unlabored. Heart: irregularly irregular, with clear S1 S2. No murmurs, rubs, or gallops appreciated. Abdomen: Soft, non-tender, non-distended with normoactive bowel sounds. No hepatomegaly. No rebound/guarding. No obvious abdominal masses. Msk:  Strength and tone  appears normal for age. No tenderness to palpation of anterior or lateral thorax. Extremities: No clubbing, cyanosis or edema.  Distal pedal pulses are 2+ and equal bilaterally. Neuro: Alert and oriented X 3. Moves all extremities spontaneously. Psych:  Responds to questions appropriately with a normal affect.  Labs: Recent Labs  Basename 01/21/11 1217 01/21/11 1200   WBC -- 10.1   HGB 15.6 14.3   HCT 46.0 43.1   MCV -- 84.5   PLT -- 288    Lab 01/21/11 1217  NA 141  K 3.9  CL 101  CO2 --  BUN 10  CREATININE 0.90  CALCIUM --  PROT --  BILITOT --  ALKPHOS --  ALT --  AST --  AMYLASE --  LIPASE --  GLUCOSE 138*   No results found for this basename: HGBA1C in the last 72 hours Recent Labs  Basename 01/21/11 1445 01/21/11 1200   CKTOTAL 175 --   CKMB 5.3* --   CKMBINDEX -- --   TROPONINI <0.30 <0.30    Radiology/Studies: Dg Ribs Unilateral W/chest Left  01/21/2011  *RADIOLOGY REPORT*  Clinical Data: Left-sided chest pain for 4 days.  No known injury.  LEFT RIBS AND CHEST - 3+ VIEW  Comparison: 09/12/2010 chest film.  Findings: Frontal view of the chest demonstrates a midline trachea. Mild cardiomegaly. Mediastinal contours otherwise within normal limits.  No pleural effusion or pneumothorax.  Clear lungs.  4 views of left sided ribs.  Lower thoracic spondylosis is advanced.  No displaced rib fracture.  IMPRESSION: No displaced rib fracture or pneumothorax.  Mild cardiomegaly, without congestive failure.  Original Report Authenticated By: Consuello Bossier, M.D.    EKG: NSR, 77 bpm, PVC noted, TWI V4-V6, II, III and aVF Telemetry: Atrial arrythmia vs frequent PACs and occasional PVCs, 70-80 bpm, TWIs noted in same leads   ASSESSMENT AND PLAN:   1. NSTEMI- pt with TWIs inferolaterally on EKG and elevated CK-MB; he has no prior cardiac work-up; however, does have significant risk factors. Echo is pending.   - Admit to step-down  - Cycle cardiac enzymes  - Heparin gtt  -  Continue BB, ASA, ACEI, statin  - NTG SL, Morphine IV for pain  - Plan for cath tomorrow  - Will review pending echo results  2. HTN  - Continuing BB, ACEI; will monitor   3. Type 2 DM  - Continue home Glipizide, SSI  - A1C pending  Signed, R. Hurman Horn, PA-C 01/21/2011, 4:10 PM  This gentleman has no known prior history of chest pain.  He got a flu shot in the past few days. He saw Dr. Rennis Golden in August but did not go back, and stopped his clonidine.  He has not gone back.  Now in the ER with very sharp, sudden pain which he say is a "nerve".  He is visibly upset at the ER.  He denies any central chest pain at all, and the symptoms are not characteristic as heaviness or with diaphoresis at present.  I observed  it, and he was uncomfortable, the pain was sharp, and located in the left rib cage.  It became some tender at that time.  He has not had this in the past.  It appears MS in origin.  However, his ECG is changed, with more inferolateral T wave inversion, and there is one pos CKMB.  He is not short of breath.    Exam  Normocephalic. No JVD. Lungs are clear. No obvious rub.   No evid of DVT  ECG changed from August, with T wave abnormalities.    I think he will need cardiac cath in light of these changes, and I would treat him as a possible ACS.  However, I do not think this addresses the true chief complaint, as his source of pain clear seems more MS in etiology, and will require separate treatment. I would make sure he is comfortable from that standpoint before we entertain cath in the absence of progressive ECG changes.  Shawnie Pons 01/21/2011 4:50 PM

## 2011-01-21 NOTE — ED Notes (Signed)
Pt refused to take 3rd nitroglycerin. States "That ain't no pain medicine. I need something for pain."

## 2011-01-21 NOTE — ED Notes (Signed)
Pt reports receiving flu vaccine Friday then began having L sided chest pain afterwards. Denies n/v, shob, flu-like symptoms.

## 2011-01-21 NOTE — Progress Notes (Signed)
ANTICOAGULATION CONSULT NOTE - Initial Consult  Pharmacy Consult for Heparin Indication: ACS/NSTEMI  No Known Allergies  Patient Measurements: Height: 6\' 4"  (193 cm) Weight: 230 lb (104.327 kg) IBW/kg (Calculated) : 86.8  Heparin dosing weight = 104.3 kg  Vital Signs: Temp: 98.5 F (36.9 C) (12/11 1132) Temp src: Oral (12/11 1132) BP: 151/71 mmHg (12/11 1132) Pulse Rate: 87  (12/11 1132)  Labs:  Basename 01/21/11 1445 01/21/11 1434 01/21/11 1217 01/21/11 1200  HGB -- -- 15.6 14.3  HCT -- -- 46.0 43.1  PLT -- -- -- 288  APTT -- -- -- --  LABPROT -- -- -- --  INR -- -- -- --  HEPARINUNFRC -- -- -- --  CREATININE -- 0.75 0.90 --  CKTOTAL 175 -- -- --  CKMB 5.3* -- -- --  TROPONINI <0.30 -- -- <0.30   Estimated Creatinine Clearance: 140 ml/min (by C-G formula based on Cr of 0.75).  Medical History: Past Medical History  Diagnosis Date  . Hypertension   . Diabetes mellitus   . Coronary artery disease   . Irregular heartbeat     Medications:  Scheduled:    . aspirin  81 mg Oral Daily  . fentaNYL  100 mcg Intravenous Once  . glipiZIDE  5 mg Oral BID AC  . insulin aspart  0-9 Units Subcutaneous TID WC  . lisinopril  20 mg Oral Daily  . metoprolol  50 mg Oral BID  . morphine      . ondansetron  4 mg Intravenous Once  . DISCONTD:  morphine injection  4 mg Intravenous Once   Infusions:    . sodium chloride 125 mL/hr at 01/21/11 1305    Assessment: 55 year old male with chief complaint of chest pain.  IV heparin to begin for ACS/NSTEMI.  Plan for cath lab in A.M.  Goal of Therapy:  Heparin level 0.3-0.7 units/ml   Plan:  1.  Check baseline PTT, PT/INR 2.  Heparin 4000 unit IV bolus x 1 then begin IV heparin drip @ 1350 unit/hr 3.  Check heparin level 6 hrs after heparin started.  Charlisha Market, Joselyn Glassman 01/21/2011,5:25 PM

## 2011-01-21 NOTE — H&P (Signed)
Hospital Admission Note Date: 01/21/2011  PCP: Lillia Carmel, MD, MD  Chief Complaint: Chest pain.  History of Present Illness: 55 year old with past medical history significant for, diabetes, hypertension, former smoker who presents to the emergency department complaining of chest pain. Patient relate that pain started 5 days prior to admission, sharp in quality, intermittent, worse with movement and exertion. No pleuritic in quality. Worse with certain movement. Denies shortness of breath on exertion.  Denies fever, chills, nausea, vomiting. Pain is very severe.    Allergies: Review of patient's allergies indicates no known allergies. Past Medical History  Diagnosis Date  . Hypertension   . Diabetes mellitus   . Coronary artery disease    Prior to Admission medications   Medication Sig Start Date End Date Taking? Authorizing Provider  aspirin EC 81 MG tablet Take 81 mg by mouth daily.     Yes Historical Provider, MD  glipiZIDE (GLUCOTROL) 5 MG tablet Take 5 mg by mouth 2 (two) times daily before a meal.     Yes Historical Provider, MD  indomethacin (INDOCIN) 25 MG capsule Take 25 mg by mouth 3 (three) times daily as needed. For pain  01/07/11 04/07/11 Yes Roma Kayser Schorr, NP  lisinopril (PRINIVIL,ZESTRIL) 20 MG tablet Take 20 mg by mouth daily.     Yes Historical Provider, MD  metFORMIN (GLUCOPHAGE) 500 MG tablet Take 1,000 mg by mouth 2 (two) times daily with a meal.     Yes Historical Provider, MD  metoprolol (LOPRESSOR) 50 MG tablet Take 50 mg by mouth 2 (two) times daily.     Yes Historical Provider, MD  pravastatin (PRAVACHOL) 40 MG tablet Take 40 mg by mouth once.     Yes Historical Provider, MD   Past Surgical History  Procedure Date  . Tonsillectomy   . Left thumb surgery    History reviewed. No pertinent family history. History   Social History  . Marital Status: Single    Spouse Name: N/A    Number of Children: N/A  . Years of Education: N/A   Occupational  History  . Not on file.   Social History Main Topics  . Smoking status: Former Smoker    Quit date: 01/20/2009  . Smokeless tobacco: Never Used  . Alcohol Use: Yes     rarely  . Drug Use: No  . Sexually Active: No    Review of Systems: Pertinent items are noted in HPI. Physical Exam: Filed Vitals:   01/21/11 1114 01/21/11 1132 01/21/11 1147  BP: 184/91 151/71   Pulse: 88 87   Temp: 97.1 F (36.2 C) 98.5 F (36.9 C)   TempSrc: Oral Oral   Resp: 16 18   SpO2: 100% 98% 98%   No intake or output data in the 24 hours ending 01/21/11 1448 BP 151/71  Pulse 87  Temp(Src) 98.5 F (36.9 C) (Oral)  Resp 18  SpO2 98%  General Appearance:    Alert, cooperative, no distress, appears stated age  Head:    Normocephalic, without obvious abnormality, atraumatic  Eyes:    PERRL, conjunctiva/corneas clear, EOM's intact.        Ears:    Normal TM's and external ear canals, both ears  Nose:   Nares normal, septum midline, mucosa normal, no drainage    or sinus tenderness  Throat:   Lips, mucosa, and tongue normal; teeth and gums normal  Neck:   Supple, symmetrical, trachea midline, no adenopathy;       thyroid:  No enlargement/tenderness/nodules; no carotid   bruit or JVD     Lungs:     Clear to auscultation bilaterally, respirations unlabored     Heart:    Regular rate and rhythm, S1 and S2 normal, no murmur, rub   or gallop  Abdomen:     Soft, non-tender, bowel sounds active all four quadrants,    no masses, no organomegaly        Extremities:   Extremities normal, atraumatic, no cyanosis or edema  Pulses:   2+ and symmetric all extremities  Skin:   Skin color, texture, turgor normal, no rashes or lesions     Neurologic:   CNII-XII intact. Normal strength, sensation and reflexes      throughout   Lab results:  [facility] 01/21/11 1217  NA 141  K 3.9  CL 101  CO2 --  GLUCOSE 138*  BUN 10  CREATININE 0.90  CALCIUM --  MG --  PHOS --    Basename 01/21/11 1217  01/21/11 1200  WBC -- 10.1  NEUTROABS -- 7.9*  HGB 15.6 14.3  HCT 46.0 43.1  MCV -- 84.5  PLT -- 288    Basename 01/21/11 1200  CKTOTAL --  CKMB --  CKMBINDEX --  TROPONINI <0.30   Imaging results:  Dg Ribs Unilateral W/chest Left  01/21/2011  *RADIOLOGY REPORT*  Clinical Data: Left-sided chest pain for 4 days.  No known injury.  LEFT RIBS AND CHEST - 3+ VIEW  Comparison: 09/12/2010 chest film.  Findings: Frontal view of the chest demonstrates a midline trachea. Mild cardiomegaly. Mediastinal contours otherwise within normal limits.  No pleural effusion or pneumothorax.  Clear lungs.  4 views of left sided ribs.  Lower thoracic spondylosis is advanced.  No displaced rib fracture.  IMPRESSION: No displaced rib fracture or pneumothorax.  Mild cardiomegaly, without congestive failure.  Original Report Authenticated By: Consuello Bossier, M.D.   Other results: EKG: Diffuse T wave inversion 2,3, AVF,. V 4, 5, 6.    Patient Active Hospital Problem List:  Chest pain (01/21/2011)  Atypical chest pain, but  in a 55 year old with multiple risk factor : DM, Hypertension, former smoker. I will consult Cardiology, I think patient might required inpatient evaluation. Cycle cardiac enzymes, EKG am. ECHO. I will check UDS, HBA1c. Nitroglycerin PRN, aspirin, continue with metoprolol. Check Mg, phosphate due to EKG changes. I will defer heparin to cardiologist.   Diabetes: Hold metformin while in the hospital. SSI. Continue with Glipizide.  Hypertension: Continue with metoprolol, lisinopril.   Ubaldo Daywalt M.D. [facility] [phone] 01/21/2011, 2:48 PM

## 2011-01-21 NOTE — ED Notes (Signed)
CBG 88 

## 2011-01-21 NOTE — Progress Notes (Signed)
  Echocardiogram 2D Echocardiogram has been performed.  Jorje Guild [facility] 01/21/2011, 3:15 PM

## 2011-01-21 NOTE — Progress Notes (Signed)
ED CM reviewed EPIC notes/H&P/labs/imaging.  ED CM spoke with Dr B Regalado [phone] about clarification of admission status

## 2011-01-21 NOTE — ED Notes (Signed)
Pt transported to floor via tech and RN with monitor, pt denies pain, heparin gtt 13.5, pt alert and oriented, no complaints

## 2011-01-22 ENCOUNTER — Encounter (HOSPITAL_COMMUNITY)
Admission: EM | Disposition: A | Discharge status: Home / Self Care | Payer: Self-pay | Source: Home / Self Care | Attending: Emergency Medicine

## 2011-01-22 ENCOUNTER — Inpatient Hospital Stay (HOSPITAL_COMMUNITY): Payer: Managed Care, Other (non HMO)

## 2011-01-22 ENCOUNTER — Other Ambulatory Visit: Payer: Self-pay

## 2011-01-22 DIAGNOSIS — R079 Chest pain, unspecified: Principal | ICD-10-CM

## 2011-01-22 LAB — CARDIAC PANEL(CRET KIN+CKTOT+MB+TROPI)
CK, MB: 4 ng/mL (ref 0.3–4.0)
CK, MB: 3.5 ng/mL (ref 0.3–4.0)
Relative Index: INVALID (ref 0.0–2.5)
Total CK: 111 U/L (ref 7–232)
Troponin I: <0.3 ng/mL (ref <0.30)

## 2011-01-22 LAB — LIPID PANEL
Cholesterol: 177 mg/dL (ref 0–200)
VLDL: 13 mg/dL (ref 0–40)

## 2011-01-22 LAB — BASIC METABOLIC PANEL
BUN: 8 mg/dL (ref 6–23)
Chloride: 104 mEq/L (ref 96–112)
Creatinine, Ser: 0.83 mg/dL (ref 0.50–1.35)
GFR calc non Af Amer: >90 mL/min (ref >90)
Glucose, Bld: 120 mg/dL — ABNORMAL HIGH (ref 70–99)
Potassium: 4.3 mEq/L (ref 3.5–5.1)

## 2011-01-22 LAB — CBC
HCT: 42.3 % (ref 39.0–52.0)
Hemoglobin: 13.4 g/dL (ref 13.0–17.0)
MCH: 27.3 pg (ref 26.0–34.0)
MCHC: 31.7 g/dL (ref 30.0–36.0)
RDW: 14.8 % (ref 11.5–15.5)

## 2011-01-22 LAB — GLUCOSE, CAPILLARY: Glucose-Capillary: 123 mg/dL — ABNORMAL HIGH (ref 70–99)

## 2011-01-22 SURGERY — LEFT HEART CATHETERIZATION WITH CORONARY ANGIOGRAM
Anesthesia: LOCAL

## 2011-01-22 SURGERY — LEFT HEART CATHETERIZATION WITH CORONARY ANGIOGRAM
Anesthesia: Moderate Sedation | Laterality: Bilateral

## 2011-01-22 MED ORDER — HYDROMORPHONE HCL PF 1 MG/ML IJ SOLN
2.0000 mg | INTRAMUSCULAR | Status: DC | PRN
  Administered 2011-01-22 – 2011-01-23 (×5): 2 mg via INTRAVENOUS
  Filled 2011-01-22 (×4): qty 2

## 2011-01-22 MED ORDER — INDOMETHACIN 25 MG PO CAPS
25.0000 mg | ORAL_CAPSULE | Freq: Two times a day (BID) | ORAL | Status: DC
  Administered 2011-01-22 – 2011-01-23 (×2): 25 mg via ORAL
  Filled 2011-01-22 (×4): qty 1

## 2011-01-22 MED ORDER — DIAZEPAM 5 MG PO TABS: 5.0000 mg | ORAL_TABLET | ORAL | Status: AC

## 2011-01-22 MED ORDER — PREDNISONE 20 MG PO TABS
40.0000 mg | ORAL_TABLET | Freq: Every day | ORAL | Status: DC
  Administered 2011-01-22 – 2011-01-24 (×3): 40 mg via ORAL
  Filled 2011-01-22 (×4): qty 2

## 2011-01-22 NOTE — Progress Notes (Signed)
ANTICOAGULATION CONSULT NOTE - Initial Consult  Pharmacy Consult for Heparin Indication: ASC/NSTEMI  No Known Allergies  Patient Measurements: Height: 6\' 4"  (193 cm) Weight: 230 lb (104.327 kg) IBW/kg (Calculated) : 86.8    Vital Signs: Temp: 97.9 F (36.6 C) (12/11 2200) Temp src: Oral (12/11 2200) BP: 179/60 mmHg (12/11 2200) Pulse Rate: 53  (12/11 2200)  Labs:  Basename 01/22/11 0056 01/21/11 2225 01/21/11 1701 01/21/11 1445 01/21/11 1434 01/21/11 1217 01/21/11 1200  HGB -- -- -- -- -- 15.6 14.3  HCT -- -- -- -- -- 46.0 43.1  PLT -- -- -- -- -- -- 288  APTT -- -- 31 -- -- -- --  LABPROT -- -- 13.7 -- -- -- --  INR -- -- 1.03 -- -- -- --  HEPARINUNFRC 0.39 -- -- -- -- -- --  CREATININE -- -- -- -- 0.75 0.90 --  CKTOTAL -- 134 -- 175 -- -- --  CKMB -- 4.2* -- 5.3* -- -- --  TROPONINI -- <0.30 -- <0.30 -- -- <0.30   Estimated Creatinine Clearance: 140 ml/min (by C-G formula based on Cr of 0.75).  Medical History: Past Medical History  Diagnosis Date  . Hypertension   . Diabetes mellitus   . Coronary artery disease   . Irregular heartbeat     Medications:  Scheduled:    . aspirin EC  325 mg Oral Daily  . diazepam  5 mg Oral On Call  . docusate sodium  100 mg Oral BID  . fentaNYL  100 mcg Intravenous Once  . glipiZIDE  5 mg Oral BID AC  . heparin  4,000 Units Intravenous Once  . insulin aspart  0-9 Units Subcutaneous TID WC  . lisinopril  20 mg Oral Daily  . metoprolol  50 mg Oral BID  . morphine      . ondansetron  4 mg Intravenous Once  . simvastatin  20 mg Oral q1800  . sodium chloride  3 mL Intravenous Q12H  . DISCONTD: aspirin  81 mg Oral Daily  . DISCONTD: enoxaparin  30 mg Subcutaneous Q24H  . DISCONTD: enoxaparin  30 mg Subcutaneous Q24H  . DISCONTD: enoxaparin (LOVENOX) injection  60 mg Subcutaneous QHS  . DISCONTD:  morphine injection  4 mg Intravenous Once  . DISCONTD: sodium chloride  3 mL Intravenous Q12H   Infusions:    . sodium  chloride 125 mL/hr at 01/21/11 1828  . heparin 1,350 Units/hr (01/21/11 1854)    Assessment: HL = 0.39 after 4000 bolus and drip @ 1350 units/hr x 6 hrs. No bleeding/IV problems per RN. Goal of Therapy:  HL= 0.3-0.7 units/ml  Plan:  Increase heparin drip to 1400  Units/hr (14 ml/hr) Recheck HL @ 0830 this am. Pt is going to cath lab sometime today.  Susanne Greenhouse R 01/22/2011,2:27 AM

## 2011-01-22 NOTE — Progress Notes (Signed)
ANTICOAGULATION CONSULT NOTE - Follow Up Consult  Pharmacy Consult for Heparin Indication: ACS/NSTEMI  No Known Allergies  Patient Measurements: Height: 6\' 4"  (193 cm) Weight: 230 lb (104.327 kg) IBW/kg (Calculated) : 86.8  Heparin dosing weight: 104.3kg  Vital Signs: Temp: 98.1 F (36.7 C) (12/12 0611) Temp src: Oral (12/12 0611) BP: 188/84 mmHg (12/12 0611) Pulse Rate: 57  (12/12 0611)  Labs:  Basename 01/22/11 0629 01/22/11 0056 01/21/11 2225 01/21/11 1701 01/21/11 1445 01/21/11 1434 01/21/11 1217 01/21/11 1200  HGB 13.4 -- -- -- -- -- 15.6 --  HCT 42.3 -- -- -- -- -- 46.0 43.1  PLT 241 -- -- -- -- -- -- 288  APTT -- -- -- 31 -- -- -- --  LABPROT -- -- -- 13.7 -- -- -- --  INR -- -- -- 1.03 -- -- -- --  HEPARINUNFRC 0.41 0.39 -- -- -- -- -- --  CREATININE 0.83 -- -- -- -- 0.75 0.90 --  CKTOTAL 111 -- 134 -- 175 -- -- --  CKMB 3.8 -- 4.2* -- 5.3* -- -- --  TROPONINI <0.30 -- <0.30 -- <0.30 -- -- --   Estimated Creatinine Clearance: 135 ml/min (by C-G formula based on Cr of 0.83).   Medications:  Scheduled:    . aspirin EC  325 mg Oral Daily  . diazepam  5 mg Oral On Call  . docusate sodium  100 mg Oral BID  . fentaNYL  100 mcg Intravenous Once  . glipiZIDE  5 mg Oral BID AC  . heparin  4,000 Units Intravenous Once  . insulin aspart  0-9 Units Subcutaneous TID WC  . lisinopril  20 mg Oral Daily  . metoprolol  50 mg Oral BID  . morphine      . ondansetron  4 mg Intravenous Once  . simvastatin  20 mg Oral q1800  . sodium chloride  3 mL Intravenous Q12H  . DISCONTD: aspirin  81 mg Oral Daily  . DISCONTD: enoxaparin  30 mg Subcutaneous Q24H  . DISCONTD: enoxaparin  30 mg Subcutaneous Q24H  . DISCONTD: enoxaparin (LOVENOX) injection  60 mg Subcutaneous QHS  . DISCONTD:  morphine injection  4 mg Intravenous Once  . DISCONTD: sodium chloride  3 mL Intravenous Q12H    Assessment: 55 y.o. Male on IV heparin for r/o MI.  Heparin level is therapeutic on current  heparin rate.  Patient is now refusing cath.  Goal of Therapy:  Heparin level 0.3-0.7 units/ml   Plan:   Continue heparin 1400 units/hr  Follow up heparin level in am if pt still here  Rollene Fare 01/22/2011,8:49 AM Pager: [phone]

## 2011-01-22 NOTE — Progress Notes (Signed)
Patient ID: Timothy Olsen, male   DOB: February 06, 1956, 54 y.o.   MRN: [id number] @ Subjective:  Muscular pain in side Patient refuses cardiac cath  Cannot afford ambulance ride to [facility] I tried to explain to him that there is no way to do cath here and his hospital Bill would be more than an ambulance ride but he became beligerant  Objective:  Filed Vitals:   01/21/11 1859 01/21/11 2029 01/21/11 2200 01/22/11 0611  BP: 144/59 164/91 179/60 188/84  Pulse:  107 53 57  Temp:  98.4 F (36.9 C) 97.9 F (36.6 C) 98.1 F (36.7 C)  TempSrc:  Oral Oral Oral  Resp: 20 20 18 18   Height:      Weight:      SpO2: 95% 97% 100% 100%    Intake/Output from previous day:  Intake/Output Summary (Last 24 hours) at 01/22/11 0748 Last data filed at 01/22/11 0600  Gross per 24 hour  Intake 2237.58 ml  Output      0 ml  Net 2237.58 ml    Physical Exam: General appearance: alert and uncooperative S/S2 no murmur  Lungs clear BS positive no HSM or HJR Plus 3 pulses no edema Neuro nonfocal  Lab Results: Basic Metabolic Panel:  Basename 01/22/11 0629 01/21/11 1434 01/21/11 1217  NA 139 -- 141  K 4.3 -- 3.9  CL 104 -- 101  CO2 29 -- --  GLUCOSE 120* -- 138*  BUN 8 -- 10  CREATININE 0.83 0.75 --  CALCIUM 9.7 -- --  MG -- 1.7 --  PHOS -- 3.1 --   Liver Function Tests: No results found for this basename: AST:2,ALT:2,ALKPHOS:2,BILITOT:2,PROT:2,ALBUMIN:2 in the last 72 hours No results found for this basename: LIPASE:2,AMYLASE:2 in the last 72 hours CBC:  Basename 01/22/11 0629 01/21/11 1217 01/21/11 1200  WBC 7.7 -- 10.1  NEUTROABS -- -- 7.9*  HGB 13.4 15.6 --  HCT 42.3 46.0 --  MCV 86.3 -- 84.5  PLT 241 -- 288   Cardiac Enzymes:  Basename 01/22/11 0629 01/21/11 2225 01/21/11 1445  CKTOTAL 111 134 175  CKMB 3.8 4.2* 5.3*  CKMBINDEX -- -- --  TROPONINI <0.30 <0.30 <0.30   BNP: No components found with this basename: POCBNP:3 D-Dimer:  Basename 01/21/11 1701  DDIMER 0.77*     Hemoglobin A1C:  Basename 01/21/11 1200  HGBA1C 5.8*   Fasting Lipid Panel: No results found for this basename: CHOL,HDL,LDLCALC,TRIG,CHOLHDL,LDLDIRECT in the last 72 hours Thyroid Function Tests:  Basename 01/21/11 1445  TSH 0.489  T4TOTAL --  T3FREE --  THYROIDAB --   Anemia Panel: No results found for this basename: VITAMINB12,FOLATE,FERRITIN,TIBC,IRON,RETICCTPCT in the last 72 hours  Imaging: Dg Ribs Unilateral W/chest Left  01/21/2011  *RADIOLOGY REPORT*  Clinical Data: Left-sided chest pain for 4 days.  No known injury.  LEFT RIBS AND CHEST - 3+ VIEW  Comparison: 09/12/2010 chest film.  Findings: Frontal view of the chest demonstrates a midline trachea. Mild cardiomegaly. Mediastinal contours otherwise within normal limits.  No pleural effusion or pneumothorax.  Clear lungs.  4 views of left sided ribs.  Lower thoracic spondylosis is advanced.  No displaced rib fracture.  IMPRESSION: No displaced rib fracture or pneumothorax.  Mild cardiomegaly, without congestive failure.  Original Report Authenticated By: Consuello Bossier, M.D.    Cardiac Studies:    Telemetry:  nsr with no arrythmia   Medications:     . aspirin EC  325 mg Oral Daily  . diazepam  5 mg Oral On  Call  . docusate sodium  100 mg Oral BID  . fentaNYL  100 mcg Intravenous Once  . glipiZIDE  5 mg Oral BID AC  . heparin  4,000 Units Intravenous Once  . insulin aspart  0-9 Units Subcutaneous TID WC  . lisinopril  20 mg Oral Daily  . metoprolol  50 mg Oral BID  . morphine      . ondansetron  4 mg Intravenous Once  . simvastatin  20 mg Oral q1800  . sodium chloride  3 mL Intravenous Q12H  . DISCONTD: aspirin  81 mg Oral Daily  . DISCONTD: enoxaparin  30 mg Subcutaneous Q24H  . DISCONTD: enoxaparin  30 mg Subcutaneous Q24H  . DISCONTD: enoxaparin (LOVENOX) injection  60 mg Subcutaneous QHS  . DISCONTD:  morphine injection  4 mg Intravenous Once  . DISCONTD: sodium chloride  3 mL Intravenous Q12H        . sodium chloride 125 mL/hr at 01/22/11 0600  . heparin 1,400 Units/hr (01/22/11 0424)    Assessment/Plan:  Chest Pain:  R/O nonspecfic ST/T wave changes.  Clinical presentation is non cardiac but has abnormal echo EF 45% and  Previous admission in August for "presyncope"/  Since patient is noncooperative would Rx pain with analgesic and D/C home On ASA and ACE inhibitor  F/U primary  Will not F/U with [facility] cardiology  Will sign off   Charlton Haws 01/22/2011, 7:48 AM

## 2011-01-22 NOTE — Progress Notes (Signed)
Patient ID: Timothy Olsen, male   DOB: 08/13/1955, 55 y.o.   MRN: [id number]  Subjective: No events overnight. Patient reports intermitent chest pain, denies shortness of breath or abdominal pain. Had bowel movement and reports ambulating with difficulty due to right knee pain and swelling.   Objective:  Vital signs in last 24 hours:  Filed Vitals:   01/21/11 1859 01/21/11 2029 01/21/11 2200 01/22/11 0611  BP: 144/59 164/91 179/60 188/84  Pulse:  107 53 57  Temp:  98.4 F (36.9 C) 97.9 F (36.6 C) 98.1 F (36.7 C)  TempSrc:  Oral Oral Oral  Resp: 20 20 18 18   Height:      Weight:      SpO2: 95% 97% 100% 100%    Intake/Output from previous day:   Intake/Output Summary (Last 24 hours) at 01/22/11 1214 Last data filed at 01/22/11 1100  Gross per 24 hour  Intake 2237.58 ml  Output    450 ml  Net 1787.58 ml    Physical Exam: General: Alert, awake, oriented x3, in no acute distress. HEENT: No bruits, no goiter. Moist mucous membranes, no scleral icterus, no conjunctival pallor. Heart: Regular rate and rhythm at the time, without murmurs, rubs, gallops. Lungs: Clear to auscultation bilaterally. No wheezing, no rhonchi, no rales.  Abdomen: Soft, nontender, nondistended, positive bowel sounds. Extremities: No clubbing cyanosis,  positive pedal pulses. Right knee swelling with no erythema and warmth to touch, no tenderness to palpation. Neuro: Grossly intact, nonfocal.  Lab Results:  Basic Metabolic Panel:    Component Value Date/Time   NA 139 01/22/2011 0629   K 4.3 01/22/2011 0629   CL 104 01/22/2011 0629   CO2 29 01/22/2011 0629   BUN 8 01/22/2011 0629   CREATININE 0.83 01/22/2011 0629   GLUCOSE 120* 01/22/2011 0629   CALCIUM 9.7 01/22/2011 0629   CBC:    Component Value Date/Time   WBC 7.7 01/22/2011 0629   HGB 13.4 01/22/2011 0629   HCT 42.3 01/22/2011 0629   PLT 241 01/22/2011 0629   MCV 86.3 01/22/2011 0629   NEUTROABS 7.9* 01/21/2011 1200   LYMPHSABS 1.2 01/21/2011 1200   MONOABS 0.8 01/21/2011 1200   EOSABS 0.1 01/21/2011 1200   BASOSABS 0.0 01/21/2011 1200      Lab 01/22/11 0629 01/21/11 1217 01/21/11 1200  WBC 7.7 -- 10.1  HGB 13.4 15.6 14.3  HCT 42.3 46.0 43.1  PLT 241 -- 288  MCV 86.3 -- 84.5  MCH 27.3 -- 28.0  MCHC 31.7 -- 33.2  RDW 14.8 -- 14.4  LYMPHSABS -- -- 1.2  MONOABS -- -- 0.8  EOSABS -- -- 0.1  BASOSABS -- -- 0.0  BANDABS -- -- --    Lab 01/22/11 0629 01/21/11 1434 01/21/11 1217  NA 139 -- 141  K 4.3 -- 3.9  CL 104 -- 101  CO2 29 -- --  GLUCOSE 120* -- 138*  BUN 8 -- 10  CREATININE 0.83 0.75 0.90  CALCIUM 9.7 -- --  MG -- 1.7 --    Lab 01/21/11 1701  INR 1.03  PROTIME --   Cardiac markers:  Lab 01/22/11 0629 01/21/11 2225 01/21/11 1445  CKMB 3.8 4.2* 5.3*  TROPONINI <0.30 <0.30 <0.30  MYOGLOBIN -- -- --   No components found with this basename: POCBNP:3 No results found for this or any previous visit (from the past 240 hour(s)).  Studies/Results: Dg Ribs Unilateral W/chest Left 01/21/2011   IMPRESSION: No displaced rib fracture or pneumothorax.  Mild cardiomegaly,  without congestive failure.     Medications: Scheduled Meds:   . aspirin EC  325 mg Oral Daily  . diazepam  5 mg Oral On Call  . docusate sodium  100 mg Oral BID  . fentaNYL  100 mcg Intravenous Once  . glipiZIDE  5 mg Oral BID AC  . heparin  4,000 Units Intravenous Once  . insulin aspart  0-9 Units Subcutaneous TID WC  . lisinopril  20 mg Oral Daily  . metoprolol  50 mg Oral BID  . morphine      . ondansetron  4 mg Intravenous Once  . simvastatin  20 mg Oral q1800  . sodium chloride  3 mL Intravenous Q12H  . DISCONTD: aspirin  81 mg Oral Daily  . DISCONTD: enoxaparin  30 mg Subcutaneous Q24H  . DISCONTD: enoxaparin  30 mg Subcutaneous Q24H  . DISCONTD: enoxaparin (LOVENOX) injection  60 mg Subcutaneous QHS  . DISCONTD:  morphine injection  4 mg Intravenous Once  . DISCONTD: sodium chloride  3 mL  Intravenous Q12H   Continuous Infusions:   . sodium chloride 125 mL/hr at 01/22/11 0600  . heparin 1,400 Units/hr (01/22/11 0424)   PRN Meds:.acetaminophen, acetaminophen, ALPRAZolam, HYDROmorphone, nitroGLYCERIN, ondansetron (ZOFRAN) IV, ondansetron, DISCONTD: sodium chloride, DISCONTD: sodium chloride  Assessment/Plan:  Principal Problem:  *Chest pain - still intermittent episodes and certainly worrisome for ACS with multiple risk factors that pt has - cards recommends CATH which is to be done at [facility] but pt can not afford transfer by ambulance, he says his copay is too high and otherwise he is in agreement to have CATH done there - for now continue Heparin and supportive care with dilaudid and nitro - d/c IV fluids - cycle CE's and obtain EKG in AM - plan on d/c in AM and readmit to [facility] for Cath  Active Problems:  Diabetes mellitus - follow up on A1C - continue Insulin   Hypertension - well controlled at this time - continue to monitor on telemetry   Hyperlipidemia - continue statin   Disposition - plan of care and diagnosis, diagnostic studies and test results were discussed with pt and family at bedside - pt and  family verbalized understanding - will discuss with SW how to arrange transport or will have to d/c first and than have pt drive to [facility] to be readmitted    LOS: 1 day   MAGICK-Timothy Olsen 01/22/2011, 12:14 PM

## 2011-01-23 DIAGNOSIS — M2540 Effusion, unspecified joint: Secondary | ICD-10-CM | POA: Diagnosis not present

## 2011-01-23 LAB — BASIC METABOLIC PANEL
BUN: 15 mg/dL (ref 6–23)
Calcium: 10.1 mg/dL (ref 8.4–10.5)
GFR calc non Af Amer: >90 mL/min (ref >90)
Glucose, Bld: 125 mg/dL — ABNORMAL HIGH (ref 70–99)

## 2011-01-23 LAB — GLUCOSE, CAPILLARY
Glucose-Capillary: 98 mg/dL (ref 70–99)
Glucose-Capillary: 189 mg/dL — ABNORMAL HIGH (ref 70–99)
Glucose-Capillary: 245 mg/dL — ABNORMAL HIGH (ref 70–99)

## 2011-01-23 LAB — CBC
HCT: 43.9 % (ref 39.0–52.0)
Hemoglobin: 14.9 g/dL (ref 13.0–17.0)
MCH: 28.5 pg (ref 26.0–34.0)
MCHC: 33.9 g/dL (ref 30.0–36.0)
MCV: 83.9 fL (ref 78.0–100.0)

## 2011-01-23 LAB — CARDIAC PANEL(CRET KIN+CKTOT+MB+TROPI)
CK, MB: 3.4 ng/mL (ref 0.3–4.0)
Total CK: 82 U/L (ref 7–232)

## 2011-01-23 LAB — HEPARIN LEVEL (UNFRACTIONATED): Heparin Unfractionated: 0.21 IU/mL — ABNORMAL LOW (ref 0.30–0.70)

## 2011-01-23 MED ORDER — HEPARIN SOD (PORCINE) IN D5W 100 UNIT/ML IV SOLN
1800.0000 [IU]/h | INTRAVENOUS | Status: DC
  Administered 2011-01-23: 1650 [IU]/h via INTRAVENOUS
  Administered 2011-01-23: 1800 [IU]/h via INTRAVENOUS
  Filled 2011-01-23 (×5): qty 250

## 2011-01-23 MED ORDER — INDOMETHACIN 25 MG PO CAPS
25.0000 mg | ORAL_CAPSULE | Freq: Three times a day (TID) | ORAL | Status: DC
  Administered 2011-01-23 – 2011-01-24 (×2): 25 mg via ORAL
  Filled 2011-01-23 (×4): qty 1

## 2011-01-23 MED ORDER — LISINOPRIL 40 MG PO TABS
40.0000 mg | ORAL_TABLET | Freq: Every day | ORAL | Status: DC
  Filled 2011-01-23: qty 1

## 2011-01-23 MED ORDER — LISINOPRIL 20 MG PO TABS
20.0000 mg | ORAL_TABLET | Freq: Once | ORAL | Status: DC
  Filled 2011-01-23: qty 1

## 2011-01-23 NOTE — Progress Notes (Signed)
PT Note  Order received and chart reviewed.  Spoke with pt at bedside and he reports he has no PT needs at this time.  Inquired about mobility with R knee pain and pt reports he has been moving around fine in room with RW.  Pt reports knee pain has been better since meds given this am.  Pt declines RW for home use.  Pt reports he does not require PT and has no DME needs upon D/C.  PT to sign off.  Zenovia Jarred, PT, DPT Pager: [phone]

## 2011-01-23 NOTE — Progress Notes (Signed)
Patient ID: Timothy Olsen, male   DOB: June 09, 1955, 55 y.o.   MRN: [id number] Patient ID: Timothy Olsen, male   DOB: 05-22-1955, 55 y.o.   MRN: [id number] @ Subjective:  Patient denies chest pain or shortness of breath today. Objective:  Filed Vitals:   01/22/11 1441 01/22/11 1900 01/22/11 2115 01/23/11 0540  BP: 200/50 153/85 164/82 158/58  Pulse: 86  53 76  Temp: 97.8 F (36.6 C)  97.7 F (36.5 C) 98.6 F (37 C)  TempSrc: Oral  Oral Oral  Resp: 20  19 19   Height:      Weight:      SpO2: 96%  99% 100%    Intake/Output from previous day:  Intake/Output Summary (Last 24 hours) at 01/23/11 1402 Last data filed at 01/23/11 0600  Gross per 24 hour  Intake    417 ml  Output      0 ml  Net    417 ml    Physical Exam: General appearance: alert and uncooperative S/S2 no murmur  Lungs clear Abdomen BS positive no HSM  Ext no edema Neuro nonfocal  Lab Results: Basic Metabolic Panel:  Basename 01/23/11 0518 01/22/11 0629 01/21/11 1434  NA 137 139 --  K 4.1 4.3 --  CL 101 104 --  CO2 29 29 --  GLUCOSE 125* 120* --  BUN 15 8 --  CREATININE 0.81 0.83 --  CALCIUM 10.1 9.7 --  MG -- -- 1.7  PHOS -- -- 3.1  CBC:  Basename 01/23/11 0518 01/22/11 0629 01/21/11 1200  WBC 13.6* 7.7 --  NEUTROABS -- -- 7.9*  HGB 14.9 13.4 --  HCT 43.9 42.3 --  MCV 83.9 86.3 --  PLT 305 241 --   Cardiac Enzymes:  Basename 01/23/11 0518 01/22/11 2046 01/22/11 1451  CKTOTAL 82 95 118  CKMB 3.4 3.5 4.0  CKMBINDEX -- -- --  TROPONINI <0.30 <0.30 <0.30    Basename 01/21/11 1701  DDIMER 0.77*   Hemoglobin A1C:  Basename 01/21/11 1200  HGBA1C 5.8*   Fasting Lipid Panel:  Basename 01/22/11 0629  CHOL 177  HDL 62  LDLCALC 102*  TRIG 65  CHOLHDL 2.9  LDLDIRECT --   Thyroid Function Tests:  Basename 01/21/11 1445  TSH 0.489  T4TOTAL --  T3FREE --  THYROIDAB --   Imaging: Dg Knee Complete 4 Views Right  01/22/2011  *RADIOLOGY REPORT*  Clinical Data: Diffuse right  knee pain and swelling.  History of gout.  No trauma.  RIGHT KNEE - COMPLETE 4+ VIEW  Comparison: None.  Findings: There is tricompartmental marginal spur formation without joint space narrowing.  There is prominent soft tissue swelling superior to the patella which may represent a prominent joint effusion.  Alternatively, this could represent an abnormality in the distal quadriceps muscles.  IMPRESSION:  1.  Marked soft tissue swelling superior to the patella which probably represents a prominent joint effusion or less likely swelling in the distal quadriceps muscles. 2.  Mild tricompartmental arthritis with marginal spurs.  Original Report Authenticated By: Gwynn Burly, M.D.     Medications:      . aspirin EC  325 mg Oral Daily  . diazepam  5 mg Oral On Call  . docusate sodium  100 mg Oral BID  . glipiZIDE  5 mg Oral BID AC  . indomethacin  25 mg Oral TID WC  . insulin aspart  0-9 Units Subcutaneous TID WC  . lisinopril  40 mg Oral Daily  . metoprolol  50 mg Oral BID  . predniSONE  40 mg Oral Q breakfast  . simvastatin  20 mg Oral q1800  . sodium chloride  3 mL Intravenous Q12H  . DISCONTD: indomethacin  25 mg Oral BID WC  . DISCONTD: lisinopril  20 mg Oral Daily        . heparin 1,550 Units/hr (01/23/11 0800)  . DISCONTD: heparin 1,400 Units/hr (01/23/11 0026)    Assessment/Plan:  Chest Pain:  Patient's chest pain is felt to be musculoskeletal. Enzymes are negative. However he did have electrocardiographic changes and previous echo showed reduced LV function. Cardiac catheterization was recommended by Dr. Riley Kill and Dr. Eden Emms. I reviewed this in detail with the patient today. I explained the risks of cardiac catheterization including death, myocardial infarction and stroke. The patient stated that if he had a complication "somebody would be sued". He then stated he did not need a cardiac catheterization. I offered a stress test followed by cardiac catheterization if stress  test abnormal and he declined. I explained the risk of undiagnosed coronary artery disease including myocardial infarction and death. He again refuses cardiac catheterization. Would treat medically and have patient followup with his primary care physician. Hypertension-would continue ACE inhibitor and beta blocker. He will need close followup with his primary care physician for medication titration and followup of renal function. Diabetes mellitus-he needs aggressive therapy including statin. He will need followup lipids and liver with his primary care physician. He is not interested in pursuing further cardiac evaluation. He is not interested in cardiology followup and his primary issues can be managed by his medical physician.  Will sign off   Olga Millers 01/23/2011, 2:02 PM

## 2011-01-23 NOTE — Progress Notes (Signed)
Addenum to previous Pharmacy Note  Heparin level returns as 0.21 (subtherapeutic) Heparin running at 1550 units/hr No complications/bleeding  Plan:  Increase heparin to 1650 units/hr and recheck heparin level at 2200 tonight.

## 2011-01-23 NOTE — Progress Notes (Signed)
Hep level .23 at 0518.  MD/pharm aware

## 2011-01-23 NOTE — Progress Notes (Signed)
Patient ID: Timothy Olsen, male   DOB: 05/02/1955, 55 y.o.   MRN: [id number]  Subjective: No events overnight. Patient denies shortness of breath, abdominal pain. He still reports intermittent episodes of chest pain but now improved.  Objective:  Vital signs in last 24 hours:  Filed Vitals:   01/22/11 1441 01/22/11 1900 01/22/11 2115 01/23/11 0540  BP: 200/50 153/85 164/82 158/58  Pulse: 86  53 76  Temp: 97.8 F (36.6 C)  97.7 F (36.5 C) 98.6 F (37 C)  TempSrc: Oral  Oral Oral  Resp: 20  19 19   Height:      Weight:      SpO2: 96%  99% 100%    Intake/Output from previous day:   Intake/Output Summary (Last 24 hours) at 01/23/11 1158 Last data filed at 01/23/11 0600  Gross per 24 hour  Intake    417 ml  Output      0 ml  Net    417 ml    Physical Exam: General: Alert, awake, oriented x3, in no acute distress. HEENT: No bruits, no goiter. Moist mucous membranes, no scleral icterus, no conjunctival pallor. Heart: Regular rate and rhythm, without murmurs, rubs, gallops. Lungs: Clear to auscultation bilaterally. No wheezing, no rhonchi, no rales.  Abdomen: Soft, nontender, nondistended, positive bowel sounds. Extremities: No clubbing cyanosis,  positive pedal pulses. Right knee joint swelling improved since yesterday, not warm to touch and not tender to palpation Neuro: Grossly intact, nonfocal.  Lab Results:  Basic Metabolic Panel:    Component Value Date/Time   NA 137 01/23/2011 0518   K 4.1 01/23/2011 0518   CL 101 01/23/2011 0518   CO2 29 01/23/2011 0518   BUN 15 01/23/2011 0518   CREATININE 0.81 01/23/2011 0518   GLUCOSE 125* 01/23/2011 0518   CALCIUM 10.1 01/23/2011 0518   CBC:    Component Value Date/Time   WBC 13.6* 01/23/2011 0518   HGB 14.9 01/23/2011 0518   HCT 43.9 01/23/2011 0518   PLT 305 01/23/2011 0518   MCV 83.9 01/23/2011 0518   NEUTROABS 7.9* 01/21/2011 1200   LYMPHSABS 1.2 01/21/2011 1200   MONOABS 0.8 01/21/2011 1200   EOSABS 0.1  01/21/2011 1200   BASOSABS 0.0 01/21/2011 1200      Lab 01/23/11 0518 01/22/11 0629 01/21/11 1217 01/21/11 1200  WBC 13.6* 7.7 -- 10.1  HGB 14.9 13.4 15.6 14.3  HCT 43.9 42.3 46.0 43.1  PLT 305 241 -- 288  MCV 83.9 86.3 -- 84.5  MCH 28.5 27.3 -- 28.0  MCHC 33.9 31.7 -- 33.2  RDW 14.1 14.8 -- 14.4  LYMPHSABS -- -- -- 1.2  MONOABS -- -- -- 0.8  EOSABS -- -- -- 0.1  BASOSABS -- -- -- 0.0  BANDABS -- -- -- --    Lab 01/23/11 0518 01/22/11 0629 01/21/11 1434 01/21/11 1217  NA 137 139 -- 141  K 4.1 4.3 -- 3.9  CL 101 104 -- 101  CO2 29 29 -- --  GLUCOSE 125* 120* -- 138*  BUN 15 8 -- 10  CREATININE 0.81 0.83 0.75 0.90  CALCIUM 10.1 9.7 -- --  MG -- -- 1.7 --    Lab 01/21/11 1701  INR 1.03  PROTIME --   Cardiac markers:  Lab 01/23/11 0518 01/22/11 2046 01/22/11 1451  CKMB 3.4 3.5 4.0  TROPONINI <0.30 <0.30 <0.30  MYOGLOBIN -- -- --    Studies/Results:  Dg Ribs Unilateral W/chest Left 01/21/2011  IMPRESSION: No displaced rib fracture or pneumothorax.  Mild cardiomegaly, without congestive failure.    Dg Knee Complete 4 Views Right 01/22/2011  IMPRESSION:  1.  Marked soft tissue swelling superior to the patella which probably represents a prominent joint effusion or less likely swelling in the distal quadriceps muscles. 2.  Mild tricompartmental arthritis with marginal spurs.   Medications: Scheduled Meds:   . aspirin EC  325 mg Oral Daily  . diazepam  5 mg Oral On Call  . docusate sodium  100 mg Oral BID  . glipiZIDE  5 mg Oral BID AC  . indomethacin  25 mg Oral BID WC  . insulin aspart  0-9 Units Subcutaneous TID WC  . lisinopril  20 mg Oral Daily  . metoprolol  50 mg Oral BID  . predniSONE  40 mg Oral Q breakfast  . simvastatin  20 mg Oral q1800  . sodium chloride  3 mL Intravenous Q12H   Continuous Infusions:   . heparin 1,550 Units/hr (01/23/11 0800)  . DISCONTD: sodium chloride 125 mL/hr at 01/22/11 0600  . DISCONTD: heparin 1,400 Units/hr  (01/23/11 0026)   PRN Meds:.acetaminophen, acetaminophen, ALPRAZolam, HYDROmorphone, nitroGLYCERIN, ondansetron (ZOFRAN) IV, ondansetron, DISCONTD: HYDROmorphone  Assessment/Plan:  Principal Problem:  *Chest pain - pt's pain is improved but he still experience intermittent episodes, please note he also has risk factors: HTN, HLD, DM - pt is currently on heparin, nitro prn, and analgesia for pain control - 3 lasts sets of CE's are now within normal limits and review of tele is unremarkable with no St/T segment changes - no significant change on 12 lead EKG this AM - electrolytes are stable and within normal limits - pt now in agreement to have Cath done if cardiology still agrees, please note pt is still on heparin - if Cath planned will arrange transport to [facility]  Active Problems:  Diabetes mellitus - A1C 5.8 - well controlled   Hypertension - will increase Lisinopril to 40 mg tab QD   Hyperlipidemia - continue statin   Right knee joint swelling - continue prednisone and indomethacin since improvement noted - if effussion worsening may need to have it tapped but pt refusing at this time   Disposition - plan of care and diagnosis, diagnostic studies and test results were discussed with pt  - pt  verbalized understanding   LOS: 2 days   MAGICK-Ammy Lienhard 01/23/2011, 11:58 AM

## 2011-01-23 NOTE — Progress Notes (Signed)
Heparin level 0.28 while running at 1650 units/hr.  No issues per RN.  Patient with low level again. Will increase to 1800 units/hr and recheck level at 0800 12/14 [facility] Darlina Guys PharmD, BCPS  01/23/2011, 11:06 PM

## 2011-01-23 NOTE — Progress Notes (Signed)
ANTICOAGULATION CONSULT NOTE - Follow Up Consult  Pharmacy Consult for Heparin Indication: ACS/NSTEMI  No Known Allergies  Patient Measurements: Height: 6\' 4"  (193 cm) Weight: 230 lb (104.327 kg) IBW/kg (Calculated) : 86.8  Heparin dosing weight: 104.3kg  Vital Signs: Temp: 98.6 F (37 C) (12/13 0540) Temp src: Oral (12/13 0540) BP: 158/58 mmHg (12/13 0540) Pulse Rate: 76  (12/13 0540)  Labs:  Basename 01/23/11 0518 01/22/11 2046 01/22/11 1451 01/22/11 0629 01/22/11 0056 01/21/11 1701 01/21/11 1434 01/21/11 1217 01/21/11 1200  HGB 14.9 -- -- 13.4 -- -- -- -- --  HCT 43.9 -- -- 42.3 -- -- -- 46.0 --  PLT 305 -- -- 241 -- -- -- -- 288  APTT -- -- -- -- -- 31 -- -- --  LABPROT -- -- -- -- -- 13.7 -- -- --  INR -- -- -- -- -- 1.03 -- -- --  HEPARINUNFRC 0.23* -- -- 0.41 0.39 -- -- -- --  CREATININE 0.81 -- -- 0.83 -- -- 0.75 -- --  CKTOTAL 82 95 118 -- -- -- -- -- --  CKMB 3.4 3.5 4.0 -- -- -- -- -- --  TROPONINI <0.30 <0.30 <0.30 -- -- -- -- -- --   Estimated Creatinine Clearance: 138.3 ml/min (by C-G formula based on Cr of 0.81).   Medications:  Scheduled:     . aspirin EC  325 mg Oral Daily  . diazepam  5 mg Oral On Call  . docusate sodium  100 mg Oral BID  . glipiZIDE  5 mg Oral BID AC  . indomethacin  25 mg Oral BID WC  . insulin aspart  0-9 Units Subcutaneous TID WC  . lisinopril  20 mg Oral Daily  . metoprolol  50 mg Oral BID  . predniSONE  40 mg Oral Q breakfast  . simvastatin  20 mg Oral q1800  . sodium chloride  3 mL Intravenous Q12H    Assessment:  55 YOM started heparin 12/11 for r/o ACS/NSTEMI  Patient refused cardiac cath 12/12 and heparin restarted at at 1400 units/hr.  HL was 0.23 this AM, subtherapeutic.   Plan D/C today and readmit to [facility] for cath or arrange transport with SW  No bleeding reported, CBC wnl  Patient previously therapeutic on lower doses of heparin  Goal of Therapy:  Heparin level 0.3-0.7 units/ml   Plan:   No  bolus needed  Increase heparin gtt to 1550 units/hr  F/u 6 hours heparin level.    Geoffry Paradise Thi 01/23/2011,7:38 AM

## 2011-01-24 MED ORDER — NITROGLYCERIN 0.4 MG SL SUBL: 0.4000 mg | SUBLINGUAL_TABLET | SUBLINGUAL | Status: DC | PRN

## 2011-01-24 MED ORDER — ALPRAZOLAM 0.25 MG PO TABS: 0.2500 mg | ORAL_TABLET | Freq: Three times a day (TID) | ORAL | Status: AC | PRN

## 2011-01-24 MED ORDER — PREDNISONE 20 MG PO TABS: 40.0000 mg | ORAL_TABLET | Freq: Every day | ORAL | Status: AC

## 2011-01-24 MED ORDER — INDOMETHACIN 25 MG PO CAPS: 25.0000 mg | ORAL_CAPSULE | Freq: Three times a day (TID) | ORAL | Status: AC

## 2011-01-24 MED ORDER — BISACODYL 10 MG RE SUPP
10.0000 mg | RECTAL | Status: DC
Start: 2011-01-24 — End: 2011-01-24

## 2011-01-24 MED ORDER — HYDROMORPHONE HCL 2 MG PO TABS: 2.0000 mg | ORAL_TABLET | ORAL | Status: AC | PRN

## 2011-01-24 MED ORDER — ASPIRIN 325 MG PO TBEC: 325.0000 mg | DELAYED_RELEASE_TABLET | Freq: Every day | ORAL | Status: AC

## 2011-01-24 MED ORDER — HYDRALAZINE HCL 20 MG/ML IJ SOLN
10.0000 mg | Freq: Once | INTRAMUSCULAR | Status: AC
  Administered 2011-01-24: 10 mg via INTRAVENOUS
  Filled 2011-01-24: qty 1

## 2011-01-24 NOTE — Progress Notes (Signed)
Discharge instructions given to pt, verbalized understanding. Left the unit in stable condition. 

## 2011-01-24 NOTE — Discharge Summary (Signed)
Patient ID: Timothy Olsen MRN: [id number] DOB/AGE: [birth year] 55 y.o.  Admit date: 01/21/2011 Discharge date: 01/24/2011  Primary Care Physician:  Lillia Carmel, MD, MD  Discharge Diagnoses:    Present on Admission:  **None**  Principal Problem:  *Chest pain Active Problems:  Joint swelling  Diabetes mellitus  Hypertension  Hyperlipidemia   Current Discharge Medication List    START taking these medications   Details  ALPRAZolam (XANAX) 0.25 MG tablet Take 1 tablet (0.25 mg total) by mouth 3 (three) times daily as needed for anxiety. Qty: 30 tablet, Refills: 0    HYDROmorphone (DILAUDID) 2 MG tablet Take 1 tablet (2 mg total) by mouth every 4 (four) hours as needed for pain. Qty: 60 tablet, Refills: 0    nitroGLYCERIN (NITROSTAT) 0.4 MG SL tablet Place 1 tablet (0.4 mg total) under the tongue every 5 (five) minutes as needed for chest pain. Qty: 31 tablet, Refills: 1    predniSONE (DELTASONE) 20 MG tablet Take 2 tablets (40 mg total) by mouth daily with breakfast. Taper down by 10 mg daily until completed Qty: 20 tablet, Refills: 0      CONTINUE these medications which have CHANGED   Details  aspirin EC 325 MG EC tablet Take 1 tablet (325 mg total) by mouth daily. Qty: 30 tablet, Refills: 3    !! indomethacin (INDOCIN) 25 MG capsule Take 1 capsule (25 mg total) by mouth 3 (three) times daily with meals. Qty: 90 capsule, Refills: 1     !! - Potential duplicate medications found. Please discuss with provider.    CONTINUE these medications which have NOT CHANGED   Details  glipiZIDE (GLUCOTROL) 5 MG tablet Take 5 mg by mouth 2 (two) times daily before a meal.      !! indomethacin (INDOCIN) 25 MG capsule Take 25 mg by mouth 3 (three) times daily as needed. For pain     lisinopril (PRINIVIL,ZESTRIL) 20 MG tablet Take 20 mg by mouth daily.      metFORMIN (GLUCOPHAGE) 500 MG tablet Take 1,000 mg by mouth 2 (two) times daily with a meal.      metoprolol  (LOPRESSOR) 50 MG tablet Take 50 mg by mouth 2 (two) times daily.      pravastatin (PRAVACHOL) 40 MG tablet Take 40 mg by mouth once.       !! - Potential duplicate medications found. Please discuss with provider.      Disposition and Follow-up: Pt was discharged from the hospital in stable condition and was advised to follow up with PCP in 203 weeks or sooner if he experiences chest pain. Please note that during the hospitalization he was seen by [facility] and was recommended to have cardiac catheterization done for further evaluation of his chest pain given multiple risk factors that pt has. However, he has refused the procedure on 2 occasions and for that reason he was advised to follow up with cardiology in an outpatient setting. Please also note that during the hospitalization he was treated with heparin drip, nitroglycerin and has reported that his pain was resolved by discharge date.  Consults:  cardiology  Significant Diagnostic Studies:  Dg Ribs Unilateral W/chest Left  01/21/2011.  IMPRESSION: No displaced rib fracture or pneumothorax.  Mild cardiomegaly, without congestive failure.   Brief H and P: 55 year old with past medical history significant for, diabetes, hypertension, former smoker who presents to the emergency department complaining of chest pain. Patient relate that pain started 5 days prior to  admission, sharp in quality, intermittent, worse with movement and exertion. Not pleuritic in quality. Worse with certain movement. Denies shortness of breath on exertion. Denies fever, chills, nausea, vomiting. Pain is very severe.   Physical Exam on Discharge:  Filed Vitals:   01/23/11 1438 01/23/11 2135 01/23/11 2233 01/24/11 0537  BP: 179/70 183/89 156/79 190/85  Pulse: 76 74  100  Temp: 98.1 F (36.7 C) 97.9 F (36.6 C)  97.8 F (36.6 C)  TempSrc: Oral Oral  Oral  Resp: 18 16  18   Height:      Weight:      SpO2: 98% 99%  98%     Intake/Output Summary  (Last 24 hours) at 01/24/11 0834 Last data filed at 01/24/11 0600  Gross per 24 hour  Intake 391.49 ml  Output      0 ml  Net 391.49 ml    General: Alert, awake, oriented x3, in no acute distress. HEENT: No bruits, no goiter. Heart: Regular rate and rhythm, without murmurs, rubs, gallops. Lungs: Clear to auscultation bilaterally. Abdomen: Soft, nontender, nondistended, positive bowel sounds. Extremities: No clubbing cyanosis or edema with positive pedal pulses. Swollen right knee but improved with no erythema or tenderness to palpation. Neuro: Grossly intact, nonfocal.  CBC:    Component Value Date/Time   WBC 13.6* 01/23/2011 0518   HGB 14.9 01/23/2011 0518   HCT 43.9 01/23/2011 0518   PLT 305 01/23/2011 0518   MCV 83.9 01/23/2011 0518   NEUTROABS 7.9* 01/21/2011 1200   LYMPHSABS 1.2 01/21/2011 1200   MONOABS 0.8 01/21/2011 1200   EOSABS 0.1 01/21/2011 1200   BASOSABS 0.0 01/21/2011 1200    Basic Metabolic Panel:    Component Value Date/Time   NA 137 01/23/2011 0518   K 4.1 01/23/2011 0518   CL 101 01/23/2011 0518   CO2 29 01/23/2011 0518   BUN 15 01/23/2011 0518   CREATININE 0.81 01/23/2011 0518   GLUCOSE 125* 01/23/2011 0518   CALCIUM 10.1 01/23/2011 0518    Hospital Course:   Principal Problem:  *Chest pain - pt's pain has resolved by the date of the discharge and heparin drip was d/c - cardiology has advised pt to undergo cardiac cath for further evaluation of his chest pain given his multiple risk factors but he has refused - cardiac enzymes x 3 were negative but initial EKG has shown some acute St/T wave changes that per cardiology was enough to initiate the heparin drip as well as full cardiac work up - pt has refused the cardiac cath and was discharged home with medications as recommended per cardiology - pt was advised to follow up with cardiology in several weeks or sooner if his chest pain recurs  Active Problems:  Joint swelling - this was most  likely secondary to acute gout flare - tapping the joint was advised for further evaluation but pt has refused - pt was advised to follow up with PCP to ensure that swelling is improving   Diabetes mellitus - well controlled during the hospitalization   Hypertension  - well controlled during the hospitalization   Hyperlipidemia - stable   Disposition - plan of care and diagnosis, diagnostic studies and test results were discussed with pt  - pt verbalized understanding  Time spent on Discharge: Over 30 minutes  Signed: MAGICK-Shaheen Star 01/24/2011, 8:34 AM

## 2011-08-03 ENCOUNTER — Ambulatory Visit (INDEPENDENT_AMBULATORY_CARE_PROVIDER_SITE_OTHER): Payer: Managed Care, Other (non HMO) | Admitting: Emergency Medicine

## 2011-08-03 VITALS — BP 157/83 | HR 48 | Temp 98.2°F | Resp 16 | Ht 72.5 in | Wt 223.0 lb

## 2011-08-03 DIAGNOSIS — M109 Gout, unspecified: Secondary | ICD-10-CM

## 2011-08-03 MED ORDER — METHYLPREDNISOLONE ACETATE 80 MG/ML IJ SUSP: 80.0000 mg | Freq: Once | INTRAMUSCULAR | Status: DC

## 2011-08-03 MED ORDER — COLCHICINE 0.6 MG PO TABS: 0.6000 mg | ORAL_TABLET | Freq: Every day | ORAL | Status: DC

## 2011-08-03 NOTE — Progress Notes (Deleted)
  Subjective:    Patient ID: Timothy Olsen, male    DOB: Nov 29, 1955, 56 y.o.   MRN: [id number]  HPI    Review of Systems     Objective:   Physical Exam        Assessment & Plan:

## 2011-08-03 NOTE — Progress Notes (Signed)
    Patient Name: Timothy Olsen Date of Birth: Dec 15, 1955 Medical Record Number: [id number] Gender: male Date of Encounter: 08/03/2011  History of Present Illness:  Timothy Olsen is a 56 y.o. very pleasant male patient who presents with the following:  Hand swelled over past four days.  Nohistory oof injury or overuse.  Has been treated here repeatedly for gout.  Thinks he was bitten by spider and needs antibiotics.   Very delusional   Patient Active Problem List  Diagnosis  . Chest pain  . Diabetes mellitus  . Hypertension  . Hyperlipidemia  . Joint swelling   Past Medical History  Diagnosis Date  . Hypertension   . Diabetes mellitus   . Coronary artery disease   . Irregular heartbeat    Past Surgical History  Procedure Date  . Tonsillectomy   . Left thumb surgery   . Adenoidectomy    History  Substance Use Topics  . Smoking status: Former Smoker -- 0.5 packs/day for 2 years    Types: Cigarettes  . Smokeless tobacco: Never Used  . Alcohol Use: 3.0 oz/week    6 drink(s) per week     rarely   Family History  Problem Relation Age of Onset  . Diabetes type II Mother   . Hypertension Father    No Known Allergies  Medication list has been reviewed and updated.  Prior to Admission medications   Medication Sig Start Date End Date Taking? Authorizing Provider  glipiZIDE (GLUCOTROL) 5 MG tablet Take 5 mg by mouth 2 (two) times daily before a meal.     Yes Historical Provider, MD  lisinopril (PRINIVIL,ZESTRIL) 20 MG tablet Take 20 mg by mouth daily.     Yes Historical Provider, MD  metFORMIN (GLUCOPHAGE) 500 MG tablet Take 1,000 mg by mouth 2 (two) times daily with a meal.     Yes Historical Provider, MD  metoprolol (LOPRESSOR) 50 MG tablet Take 50 mg by mouth 2 (two) times daily.     Yes Historical Provider, MD  nitroGLYCERIN (NITROSTAT) 0.4 MG SL tablet Place 1 tablet (0.4 mg total) under the tongue every 5 (five) minutes as needed for chest pain. 01/24/11  01/24/12 Yes Iskra Aurther Loft, MD  pravastatin (PRAVACHOL) 40 MG tablet Take 40 mg by mouth once.      Historical Provider, MD    Review of Systems:  As per HPI, otherwise negative.    Physical Examination: Filed Vitals:   08/03/11 1335  BP: 157/83  Pulse: 48  Temp: 98.2 F (36.8 C)  Resp: 16   Filed Vitals:   08/03/11 1335  Height: 6' 0.5" (1.842 m)  Weight: 223 lb (101.152 kg)   Body mass index is 29.83 kg/(m^2). Ideal Body Weight: Weight in (lb) to have BMI = 25: 186.5    GEN: WDWN, NAD, Non-toxic, Alert & Oriented x 3 HEENT: Atraumatic, Normocephalic.  Ears and Nose: No external deformity. EXTR: No clubbing/cyanosis.  Swelling in right hand and base of thumb.  No apparent injury capillary refill normal NEURO: Normal gait.  PSYCH: Normally interactive. Conversant. Not depressed or anxious appearing.  Calm demeanor.    Assessment and Plan: Gout Follow up as needed  Refused colcrys and injection insisted on prednisone.  Prescribed taper of 20 mg over 1 week  Carmelina Dane, MD

## 2011-08-11 DIAGNOSIS — R040 Epistaxis: Secondary | ICD-10-CM

## 2011-08-11 HISTORY — DX: Epistaxis: R04.0

## 2011-09-08 ENCOUNTER — Encounter (HOSPITAL_COMMUNITY): Payer: Self-pay | Admitting: Emergency Medicine

## 2011-09-08 ENCOUNTER — Inpatient Hospital Stay (HOSPITAL_COMMUNITY)
Admission: EM | Admit: 2011-09-08 | Discharge: 2011-09-16 | DRG: 280 | Disposition: A | Discharge status: Home / Self Care | Payer: Managed Care, Other (non HMO) | Attending: Internal Medicine | Admitting: Internal Medicine

## 2011-09-08 ENCOUNTER — Emergency Department (HOSPITAL_COMMUNITY): Payer: Managed Care, Other (non HMO)

## 2011-09-08 DIAGNOSIS — I2589 Other forms of chronic ischemic heart disease: Secondary | ICD-10-CM | POA: Diagnosis present

## 2011-09-08 DIAGNOSIS — I214 Non-ST elevation (NSTEMI) myocardial infarction: Secondary | ICD-10-CM | POA: Diagnosis present

## 2011-09-08 DIAGNOSIS — Z79899 Other long term (current) drug therapy: Secondary | ICD-10-CM

## 2011-09-08 DIAGNOSIS — I2510 Atherosclerotic heart disease of native coronary artery without angina pectoris: Secondary | ICD-10-CM | POA: Diagnosis present

## 2011-09-08 DIAGNOSIS — I4891 Unspecified atrial fibrillation: Secondary | ICD-10-CM | POA: Diagnosis present

## 2011-09-08 DIAGNOSIS — R040 Epistaxis: Secondary | ICD-10-CM

## 2011-09-08 DIAGNOSIS — I509 Heart failure, unspecified: Secondary | ICD-10-CM | POA: Diagnosis present

## 2011-09-08 DIAGNOSIS — I10 Essential (primary) hypertension: Secondary | ICD-10-CM | POA: Diagnosis present

## 2011-09-08 DIAGNOSIS — E785 Hyperlipidemia, unspecified: Secondary | ICD-10-CM | POA: Diagnosis present

## 2011-09-08 DIAGNOSIS — Z91199 Patient's noncompliance with other medical treatment and regimen due to unspecified reason: Secondary | ICD-10-CM

## 2011-09-08 DIAGNOSIS — I5043 Acute on chronic combined systolic (congestive) and diastolic (congestive) heart failure: Secondary | ICD-10-CM

## 2011-09-08 DIAGNOSIS — E119 Type 2 diabetes mellitus without complications: Secondary | ICD-10-CM | POA: Diagnosis present

## 2011-09-08 DIAGNOSIS — F1010 Alcohol abuse, uncomplicated: Secondary | ICD-10-CM | POA: Diagnosis present

## 2011-09-08 DIAGNOSIS — R7989 Other specified abnormal findings of blood chemistry: Secondary | ICD-10-CM | POA: Diagnosis present

## 2011-09-08 DIAGNOSIS — Z9119 Patient's noncompliance with other medical treatment and regimen: Secondary | ICD-10-CM

## 2011-09-08 DIAGNOSIS — M109 Gout, unspecified: Secondary | ICD-10-CM

## 2011-09-08 DIAGNOSIS — I517 Cardiomegaly: Secondary | ICD-10-CM | POA: Diagnosis present

## 2011-09-08 DIAGNOSIS — I255 Ischemic cardiomyopathy: Secondary | ICD-10-CM

## 2011-09-08 HISTORY — DX: Hyperlipidemia, unspecified: E78.5

## 2011-09-08 HISTORY — DX: Ischemic cardiomyopathy: I25.5

## 2011-09-08 HISTORY — DX: Chronic combined systolic (congestive) and diastolic (congestive) heart failure: I50.42

## 2011-09-08 HISTORY — DX: Epistaxis: R04.0

## 2011-09-08 LAB — COMPREHENSIVE METABOLIC PANEL
BUN: 14 mg/dL (ref 6–23)
CO2: 29 mEq/L (ref 19–32)
Calcium: 9 mg/dL (ref 8.4–10.5)
Chloride: 102 mEq/L (ref 96–112)
Creatinine, Ser: 0.96 mg/dL (ref 0.50–1.35)
GFR calc Af Amer: >90 mL/min (ref >90)
GFR calc non Af Amer: >90 mL/min (ref >90)
Glucose, Bld: 158 mg/dL — ABNORMAL HIGH (ref 70–99)
Total Bilirubin: 0.9 mg/dL (ref 0.3–1.2)

## 2011-09-08 LAB — PROTIME-INR
INR: 1.44 (ref 0.00–1.49)
Prothrombin Time: 17.8 seconds — ABNORMAL HIGH (ref 11.6–15.2)

## 2011-09-08 LAB — DIFFERENTIAL
Eosinophils Absolute: 0.2 10*3/uL (ref 0.0–0.7)
Lymphs Abs: 2.9 10*3/uL (ref 0.7–4.0)
Monocytes Relative: 15 % — ABNORMAL HIGH (ref 3–12)
Neutro Abs: 5.1 10*3/uL (ref 1.7–7.7)
Neutrophils Relative %: 53 % (ref 43–77)

## 2011-09-08 LAB — APTT: aPTT: 79 seconds — ABNORMAL HIGH (ref 24–37)

## 2011-09-08 LAB — CBC
Hemoglobin: 14 g/dL (ref 13.0–17.0)
MCH: 30.3 pg (ref 26.0–34.0)
Platelets: 186 10*3/uL (ref 150–400)
RBC: 4.62 MIL/uL (ref 4.22–5.81)
WBC: 9.7 10*3/uL (ref 4.0–10.5)

## 2011-09-08 LAB — TROPONIN I: Troponin I: 1.81 ng/mL (ref <0.30)

## 2011-09-08 MED ORDER — ASPIRIN EC 325 MG PO TBEC
325.0000 mg | DELAYED_RELEASE_TABLET | Freq: Once | ORAL | Status: AC
  Administered 2011-09-08: 325 mg via ORAL
  Filled 2011-09-08: qty 1

## 2011-09-08 MED ORDER — DILTIAZEM LOAD VIA INFUSION
20.0000 mg | Freq: Once | INTRAVENOUS | Status: AC
  Administered 2011-09-08: 20 mg via INTRAVENOUS
  Filled 2011-09-08: qty 20

## 2011-09-08 MED ORDER — DILTIAZEM HCL 100 MG IV SOLR
5.0000 mg/h | INTRAVENOUS | Status: DC
  Administered 2011-09-08: 5 mg/h via INTRAVENOUS
  Administered 2011-09-09 – 2011-09-11 (×8): 15 mg/h via INTRAVENOUS

## 2011-09-08 MED ORDER — HEPARIN (PORCINE) IN NACL 100-0.45 UNIT/ML-% IJ SOLN
1750.0000 [IU]/h | INTRAMUSCULAR | Status: DC
  Administered 2011-09-08: 1400 [IU]/h via INTRAVENOUS
  Administered 2011-09-09 (×2): 1750 [IU]/h via INTRAVENOUS
  Filled 2011-09-08 (×2): qty 250

## 2011-09-08 MED ORDER — HEPARIN BOLUS VIA INFUSION
4000.0000 [IU] | Freq: Once | INTRAVENOUS | Status: AC
  Administered 2011-09-08: 4000 [IU] via INTRAVENOUS

## 2011-09-08 NOTE — ED Notes (Signed)
Hurts to lay on that side took some old pain  med that he had  And it made him feel better

## 2011-09-08 NOTE — Progress Notes (Signed)
ANTICOAGULATION CONSULT NOTE - Initial Consult  Pharmacy Consult for heparin Indication: chest pain/ACS  No Known Allergies  Patient Measurements:   Heparin Dosing Weight: 99.1kg  Vital Signs: Temp: 98.4 F (36.9 C) (07/29 1935) Temp src: Oral (07/29 1935) BP: 145/105 mmHg (07/29 1935) Pulse Rate: 56  (07/29 1811)  Labs:  Alvira Philips 09/08/11 1839  HGB --  HCT --  PLT --  APTT --  LABPROT --  INR --  HEPARINUNFRC --  CREATININE 0.96  CKTOTAL --  CKMB --  TROPONINI 1.81*    The CrCl is unknown because both a height and weight (above a minimum accepted value) are required for this calculation.   Medical History: Past Medical History  Diagnosis Date  . Hypertension   . Diabetes mellitus   . Coronary artery disease   . Irregular heartbeat     Medications:   (Not in a hospital admission)  Assessment: 57 yom presented to the ED with CP to start heparin. Pt has been on heparin in the past. Baseline CBC is pending but previous labs while on heparin had been ok. Troponin is elevated at 1.81. Also noted that LFTs are elevated.   Goal of Therapy:  Heparin level 0.3-0.7 units/ml Monitor platelets by anticoagulation protocol: Yes   Plan:  1. Heparin bolus 4000 units IV x 1  2. Heparin gtt 1400 units/hr 3. Check a 6 hour heparin level 4. Daily heparin level and CBC   Tosh Glaze, Drake Leach 09/08/2011,8:19 PM

## 2011-09-08 NOTE — ED Notes (Signed)
Patient with chest pain on and off since Friday.  Patient states he has been taking his sl nitro for the pain.  Patient states he does have some shortness of breath with the chest pain.  Patient states he has a "nagging" pain, a little bit around his chest.

## 2011-09-08 NOTE — ED Notes (Signed)
Back pain and chest pain  Since Friday  , took some nitro and it helped has a vein in his leg that is bothering him and feet are swollen

## 2011-09-08 NOTE — ED Provider Notes (Addendum)
History     CSN: [id number]  Arrival date & time 09/08/11  1752   First MD Initiated Contact with Patient 09/08/11 1928      Chief Complaint  Patient presents with  . Chest Pain    (Consider location/radiation/quality/duration/timing/severity/associated sxs/prior treatment) HPI Comments: Patient presents with complaints of chest pain that began on Friday.  He denies any chest pain currently.  He states he has some mild left upper back pain. He does state that he took some nitroglycerin with some relief of his pain.  He also endorses some nausea and mild epigastric pain.  Patient is not currently nauseous at this time.  He states that he has had an MI before but does not follow with a cardiologist.  He denies any cardiac stents.  There's no specific inciting or relieving factors for the patient's pain.  No other radiation of the pain.  Patient is a 56 y.o. male presenting with chest pain. The history is provided by the patient. No language interpreter was used.  Chest Pain Primary symptoms include abdominal pain and nausea. Pertinent negatives for primary symptoms include no fever, no shortness of breath, no cough and no vomiting.     Past Medical History  Diagnosis Date  . Hypertension   . Diabetes mellitus   . Coronary artery disease   . Irregular heartbeat     Past Surgical History  Procedure Date  . Tonsillectomy   . Left thumb surgery   . Adenoidectomy     Family History  Problem Relation Age of Onset  . Diabetes type II Mother   . Hypertension Father     History  Substance Use Topics  . Smoking status: Former Smoker -- 0.5 packs/day for 2 years    Types: Cigarettes  . Smokeless tobacco: Never Used  . Alcohol Use: 3.0 oz/week    6 drink(s) per week     rarely      Review of Systems  Constitutional: Negative.  Negative for fever and chills.  HENT: Negative.   Eyes: Negative.  Negative for discharge.  Respiratory: Negative.  Negative for cough and  shortness of breath.   Cardiovascular: Positive for chest pain.  Gastrointestinal: Positive for nausea and abdominal pain. Negative for vomiting.  Genitourinary: Negative.  Negative for hematuria.  Musculoskeletal: Positive for back pain.  Skin: Negative.  Negative for color change and rash.  Neurological: Negative for syncope and headaches.  Hematological: Negative.  Negative for adenopathy.  Psychiatric/Behavioral: Negative.  Negative for confusion.  All other systems reviewed and are negative.    Allergies  Review of patient's allergies indicates no known allergies.  Home Medications   Current Outpatient Rx  Name Route Sig Dispense Refill  . COLCHICINE 0.6 MG PO TABS Oral Take 1 tablet (0.6 mg total) by mouth daily. Take 2 now and one in one hour.  Take one twice daily following 20 tablet 0  . GLIPIZIDE 5 MG PO TABS Oral Take 5 mg by mouth 2 (two) times daily before a meal.      . LISINOPRIL 20 MG PO TABS Oral Take 20 mg by mouth daily.      Marland Kitchen METFORMIN HCL 500 MG PO TABS Oral Take 1,000 mg by mouth 2 (two) times daily with a meal.      . METOPROLOL TARTRATE 50 MG PO TABS Oral Take 100 mg by mouth 2 (two) times daily.     Marland Kitchen NITROGLYCERIN 0.4 MG SL SUBL Sublingual Place 1 tablet (0.4 mg  total) under the tongue every 5 (five) minutes as needed for chest pain. 31 tablet 1  . PRAVASTATIN SODIUM 40 MG PO TABS Oral Take 40 mg by mouth once.        BP 145/105  Pulse 56  Temp 98.4 F (36.9 C) (Oral)  Resp 21  SpO2 98%  Physical Exam  Nursing note and vitals reviewed. Constitutional: He is oriented to person, place, and time. He appears well-developed and well-nourished.  Non-toxic appearance. He does not have a sickly appearance.  HENT:  Head: Normocephalic and atraumatic.  Eyes: Conjunctivae, EOM and lids are normal. Pupils are equal, round, and reactive to light.  Neck: Trachea normal, normal range of motion and full passive range of motion without pain. Neck supple.    Cardiovascular: Normal heart sounds.   Pulmonary/Chest: Effort normal and breath sounds normal. No respiratory distress. He has no wheezes. He has no rales.  Abdominal: Soft. Normal appearance. He exhibits no distension. There is tenderness. There is no rebound and no CVA tenderness.       Mild epigastric tenderness  Musculoskeletal: Normal range of motion.       Mild ankle edema bilaterally  Neurological: He is alert and oriented to person, place, and time. He has normal strength.  Skin: Skin is warm, dry and intact. No rash noted.  Psychiatric: He has a normal mood and affect. His behavior is normal. Judgment and thought content normal.    ED Course  Procedures (including critical care time)  Results for orders placed during the hospital encounter of 09/08/11  TROPONIN I      Component Value Range   Troponin I 1.81 (*) <0.30 ng/mL  COMPREHENSIVE METABOLIC PANEL      Component Value Range   Sodium 141  135 - 145 mEq/L   Potassium 3.9  3.5 - 5.1 mEq/L   Chloride 102  96 - 112 mEq/L   CO2 29  19 - 32 mEq/L   Glucose, Bld 158 (*) 70 - 99 mg/dL   BUN 14  6 - 23 mg/dL   Creatinine, Ser 1.19  0.50 - 1.35 mg/dL   Calcium 9.0  8.4 - 14.7 mg/dL   Total Protein 6.5  6.0 - 8.3 g/dL   Albumin 3.3 (*) 3.5 - 5.2 g/dL   AST 829 (*) 0 - 37 U/L   ALT 528 (*) 0 - 53 U/L   Alkaline Phosphatase 176 (*) 39 - 117 U/L   Total Bilirubin 0.9  0.3 - 1.2 mg/dL   GFR calc non Af Amer >90  >90 mL/min   GFR calc Af Amer >90  >90 mL/min   Dg Chest 2 View  09/08/2011  *RADIOLOGY REPORT*  Clinical Data: Chest pain on the left side.  History of hypertension, CAD, diabetes.  CHEST - 2 VIEW  Comparison: 01/21/2011  Findings: The heart is enlarged.  The lungs are free of focal consolidations and pleural effusions.  Mild perihilar bronchitic changes are present.  No edema.  Degenerative changes are seen in the spine.  IMPRESSION:  1.  Cardiomegaly without edema. 2.  Bronchitic changes.  Original Report  Authenticated By: Patterson Hammersmith, M.D.      Date: 09/08/2011  Rate: 144  Rhythm: atrial fibrillation  QRS Axis: right  Intervals: normal  ST/T Wave abnormalities: nonspecific T wave changes  Conduction Disutrbances:left posterior fascicular block  Narrative Interpretation:   Old EKG Reviewed: changes noted from 01-22-11 when pt had NSR with block premature atrial complexes  MDM  Patient with symptoms of chest pain since Friday as well as some nausea.  Patient does state that he's had a heart attack before but does not follow with a cardiologist.  Patient has an elevated troponin here indicative of an NSTEMI and is also in atrial fib with RVR.  Patient will be placed on a heparin drip as well as diltiazem to help control his heart rate.  I will contact the cardiologist on-call Dr. Ladona Ridgel for further evaluation of this patient.  Patient has no chest pain at this time.  He'll be given an aspirin as well.  Patient also has elevated LFTs without significant right upper quadrant pain on my exam.  He denies any prior liver problems.  I have added on a hepatitis panel and an abdominal ultrasound to further evaluate these processes as well.   CRITICAL CARE Performed by: Emeline General A   Total critical care time: 36 minutes  Critical care time was exclusive of separately billable procedures and treating other patients.  Critical care was necessary to treat or prevent imminent or life-threatening deterioration.  Critical care was time spent personally by me on the following activities: development of treatment plan with patient and/or surrogate as well as nursing, discussions with consultants, evaluation of patient's response to treatment, examination of patient, obtaining history from patient or surrogate, ordering and performing treatments and interventions, ordering and review of laboratory studies, ordering and review of radiographic studies, pulse oximetry and re-evaluation of  patient's condition.      Nat Christen, MD 09/08/11 [id number]  Nat Christen, MD 09/08/11 2110  9:20 PM Pt discussed with Dr. Mayford Knife for admission and he will see and evaluate the patient.    Nat Christen, MD 09/08/11 2120  Dr. Mayford Knife will admit the pt to his service.  Patient's ultrasound of the abdomen does not demonstrate acute abnormalities to it to suggest cholecystitis or hepatic lesions.  Hepatitis panel is still pending.  Patient has no focal right upper quadrant abdominal pain at this time.  He can obtain further workup for this as an inpatient.  His heart rate is showing improvement with the diltiazem drip as well.  Nat Christen, MD 09/09/11 [id number]

## 2011-09-09 ENCOUNTER — Encounter (HOSPITAL_COMMUNITY): Payer: Self-pay | Admitting: *Deleted

## 2011-09-09 ENCOUNTER — Encounter (HOSPITAL_COMMUNITY)
Admission: EM | Disposition: A | Discharge status: Home / Self Care | Payer: Self-pay | Source: Home / Self Care | Attending: Internal Medicine

## 2011-09-09 DIAGNOSIS — I214 Non-ST elevation (NSTEMI) myocardial infarction: Secondary | ICD-10-CM

## 2011-09-09 DIAGNOSIS — I059 Rheumatic mitral valve disease, unspecified: Secondary | ICD-10-CM

## 2011-09-09 DIAGNOSIS — I4891 Unspecified atrial fibrillation: Secondary | ICD-10-CM

## 2011-09-09 DIAGNOSIS — I2510 Atherosclerotic heart disease of native coronary artery without angina pectoris: Secondary | ICD-10-CM

## 2011-09-09 HISTORY — PX: CARDIAC CATHETERIZATION: SHX172

## 2011-09-09 HISTORY — PX: LEFT HEART CATHETERIZATION WITH CORONARY ANGIOGRAM: SHX5451

## 2011-09-09 LAB — LIPID PANEL: Total CHOL/HDL Ratio: 3.8 RATIO

## 2011-09-09 LAB — GLUCOSE, CAPILLARY: Glucose-Capillary: 165 mg/dL — ABNORMAL HIGH (ref 70–99)

## 2011-09-09 LAB — HEPATITIS PANEL, ACUTE
HCV Ab: NEGATIVE
Hep A IgM: NEGATIVE

## 2011-09-09 LAB — PRO B NATRIURETIC PEPTIDE: Pro B Natriuretic peptide (BNP): 1005 pg/mL — ABNORMAL HIGH (ref 0–125)

## 2011-09-09 LAB — COMPREHENSIVE METABOLIC PANEL
ALT: 424 U/L — ABNORMAL HIGH (ref 0–53)
Albumin: 3.2 g/dL — ABNORMAL LOW (ref 3.5–5.2)
Alkaline Phosphatase: 153 U/L — ABNORMAL HIGH (ref 39–117)
Chloride: 103 mEq/L (ref 96–112)
GFR calc Af Amer: >90 mL/min (ref >90)
Glucose, Bld: 171 mg/dL — ABNORMAL HIGH (ref 70–99)
Potassium: 3.7 mEq/L (ref 3.5–5.1)
Sodium: 139 mEq/L (ref 135–145)
Total Bilirubin: 1.4 mg/dL — ABNORMAL HIGH (ref 0.3–1.2)
Total Protein: 6.1 g/dL (ref 6.0–8.3)

## 2011-09-09 LAB — CARDIAC PANEL(CRET KIN+CKTOT+MB+TROPI)
CK, MB: 5.9 ng/mL — ABNORMAL HIGH (ref 0.3–4.0)
CK, MB: 4.9 ng/mL — ABNORMAL HIGH (ref 0.3–4.0)
CK, MB: 4.4 ng/mL — ABNORMAL HIGH (ref 0.3–4.0)
Total CK: 211 U/L (ref 7–232)
Total CK: 170 U/L (ref 7–232)
Troponin I: 1.47 ng/mL (ref <0.30)
Troponin I: 0.98 ng/mL (ref <0.30)

## 2011-09-09 LAB — CBC
HCT: 40.6 % (ref 39.0–52.0)
Hemoglobin: 13.4 g/dL (ref 13.0–17.0)
MCHC: 33 g/dL (ref 30.0–36.0)
WBC: 9.9 10*3/uL (ref 4.0–10.5)

## 2011-09-09 LAB — POCT ACTIVATED CLOTTING TIME: Activated Clotting Time: 140 seconds

## 2011-09-09 LAB — HEPARIN LEVEL (UNFRACTIONATED)
Heparin Unfractionated: <0.1 IU/mL — ABNORMAL LOW (ref 0.30–0.70)
Heparin Unfractionated: 0.12 IU/mL — ABNORMAL LOW (ref 0.30–0.70)

## 2011-09-09 LAB — MRSA PCR SCREENING: MRSA by PCR: NEGATIVE

## 2011-09-09 SURGERY — LEFT HEART CATHETERIZATION WITH CORONARY ANGIOGRAM
Anesthesia: LOCAL

## 2011-09-09 MED ORDER — CLOPIDOGREL BISULFATE 300 MG PO TABS
600.0000 mg | ORAL_TABLET | Freq: Once | ORAL | Status: AC
  Administered 2011-09-09: 600 mg via ORAL
  Filled 2011-09-09: qty 2

## 2011-09-09 MED ORDER — ACETAMINOPHEN 325 MG PO TABS: 650.0000 mg | ORAL_TABLET | ORAL | Status: DC | PRN

## 2011-09-09 MED ORDER — HEPARIN (PORCINE) IN NACL 2-0.9 UNIT/ML-% IJ SOLN
INTRAMUSCULAR | Status: AC
  Filled 2011-09-09: qty 2000

## 2011-09-09 MED ORDER — FENTANYL CITRATE 0.05 MG/ML IJ SOLN
INTRAMUSCULAR | Status: AC
  Filled 2011-09-09: qty 2

## 2011-09-09 MED ORDER — SODIUM CHLORIDE 0.9 % IV SOLN
INTRAVENOUS | Status: DC
  Administered 2011-09-09: 20 mL/h via INTRAVENOUS

## 2011-09-09 MED ORDER — ZOLPIDEM TARTRATE 5 MG PO TABS
5.0000 mg | ORAL_TABLET | Freq: Every evening | ORAL | Status: DC | PRN
  Administered 2011-09-09 – 2011-09-10 (×2): 5 mg via ORAL
  Filled 2011-09-09 (×2): qty 1

## 2011-09-09 MED ORDER — ASPIRIN 81 MG PO CHEW: 324.0000 mg | CHEWABLE_TABLET | ORAL | Status: DC

## 2011-09-09 MED ORDER — LISINOPRIL 20 MG PO TABS
20.0000 mg | ORAL_TABLET | Freq: Every day | ORAL | Status: DC
  Administered 2011-09-09 – 2011-09-16 (×8): 20 mg via ORAL
  Filled 2011-09-09 (×8): qty 1

## 2011-09-09 MED ORDER — SODIUM CHLORIDE 0.9 % IJ SOLN
3.0000 mL | INTRAMUSCULAR | Status: DC | PRN
  Administered 2011-09-09: 3 mL via INTRAVENOUS

## 2011-09-09 MED ORDER — LIDOCAINE HCL (PF) 1 % IJ SOLN
INTRAMUSCULAR | Status: AC
  Filled 2011-09-09: qty 30

## 2011-09-09 MED ORDER — NITROGLYCERIN 0.4 MG SL SUBL: 0.4000 mg | SUBLINGUAL_TABLET | SUBLINGUAL | Status: DC | PRN

## 2011-09-09 MED ORDER — ACETAMINOPHEN 325 MG PO TABS
650.0000 mg | ORAL_TABLET | ORAL | Status: DC | PRN
  Administered 2011-09-14 – 2011-09-15 (×3): 650 mg via ORAL
  Filled 2011-09-09 (×3): qty 2

## 2011-09-09 MED ORDER — SODIUM CHLORIDE 0.9 % IV SOLN: INTRAVENOUS | Status: DC

## 2011-09-09 MED ORDER — CLOPIDOGREL BISULFATE 300 MG PO TABS
600.0000 mg | ORAL_TABLET | Freq: Once | ORAL | Status: DC
  Filled 2011-09-09: qty 2

## 2011-09-09 MED ORDER — HEPARIN (PORCINE) IN NACL 100-0.45 UNIT/ML-% IJ SOLN
2050.0000 [IU]/h | INTRAMUSCULAR | Status: DC
  Administered 2011-09-09 – 2011-09-10 (×3): 2050 [IU]/h via INTRAVENOUS
  Filled 2011-09-09 (×3): qty 250

## 2011-09-09 MED ORDER — FUROSEMIDE 10 MG/ML IJ SOLN
40.0000 mg | Freq: Two times a day (BID) | INTRAMUSCULAR | Status: DC
  Filled 2011-09-09 (×2): qty 4

## 2011-09-09 MED ORDER — SODIUM CHLORIDE 0.9 % IV SOLN
INTRAVENOUS | Status: AC
  Administered 2011-09-09: 18:00:00 via INTRAVENOUS

## 2011-09-09 MED ORDER — ALPRAZOLAM 0.25 MG PO TABS
0.2500 mg | ORAL_TABLET | Freq: Three times a day (TID) | ORAL | Status: DC | PRN
  Administered 2011-09-10: 0.25 mg via ORAL
  Filled 2011-09-09: qty 1

## 2011-09-09 MED ORDER — MIDAZOLAM HCL 2 MG/2ML IJ SOLN
INTRAMUSCULAR | Status: AC
  Filled 2011-09-09: qty 2

## 2011-09-09 MED ORDER — SODIUM CHLORIDE 0.9 % IV SOLN: 250.0000 mL | INTRAVENOUS | Status: DC | PRN

## 2011-09-09 MED ORDER — SODIUM CHLORIDE 0.9 % IJ SOLN: 3.0000 mL | Freq: Two times a day (BID) | INTRAMUSCULAR | Status: DC

## 2011-09-09 MED ORDER — COLCHICINE 0.6 MG PO TABS
0.6000 mg | ORAL_TABLET | Freq: Every day | ORAL | Status: DC
  Administered 2011-09-09 – 2011-09-16 (×8): 0.6 mg via ORAL
  Filled 2011-09-09 (×8): qty 1

## 2011-09-09 MED ORDER — HEPARIN (PORCINE) IN NACL 100-0.45 UNIT/ML-% IJ SOLN
2050.0000 [IU]/h | INTRAMUSCULAR | Status: DC
  Administered 2011-09-09: 2050 [IU]/h via INTRAVENOUS
  Filled 2011-09-09 (×2): qty 250

## 2011-09-09 MED ORDER — ASPIRIN 300 MG RE SUPP: 300.0000 mg | RECTAL | Status: DC

## 2011-09-09 MED ORDER — ONDANSETRON HCL 4 MG/2ML IJ SOLN: 4.0000 mg | Freq: Four times a day (QID) | INTRAMUSCULAR | Status: DC | PRN

## 2011-09-09 MED ORDER — INSULIN ASPART 100 UNIT/ML ~~LOC~~ SOLN
0.0000 [IU] | Freq: Three times a day (TID) | SUBCUTANEOUS | Status: DC
  Administered 2011-09-09: 3 [IU] via SUBCUTANEOUS
  Administered 2011-09-10 (×2): 2 [IU] via SUBCUTANEOUS
  Administered 2011-09-10: 3 [IU] via SUBCUTANEOUS
  Administered 2011-09-11: 5 [IU] via SUBCUTANEOUS
  Administered 2011-09-11: 2 [IU] via SUBCUTANEOUS
  Administered 2011-09-12 – 2011-09-13 (×3): 3 [IU] via SUBCUTANEOUS
  Administered 2011-09-13: 8 [IU] via SUBCUTANEOUS
  Administered 2011-09-13: 3 [IU] via SUBCUTANEOUS
  Administered 2011-09-14: 5 [IU] via SUBCUTANEOUS
  Administered 2011-09-14: 3 [IU] via SUBCUTANEOUS
  Administered 2011-09-15: 2 [IU] via SUBCUTANEOUS
  Administered 2011-09-15: 3 [IU] via SUBCUTANEOUS
  Administered 2011-09-15: 5 [IU] via SUBCUTANEOUS
  Administered 2011-09-16 (×3): 3 [IU] via SUBCUTANEOUS

## 2011-09-09 MED ORDER — CLOPIDOGREL BISULFATE 75 MG PO TABS
75.0000 mg | ORAL_TABLET | Freq: Every day | ORAL | Status: DC
  Administered 2011-09-10: 75 mg via ORAL
  Filled 2011-09-09 (×2): qty 1

## 2011-09-09 MED ORDER — ASPIRIN EC 325 MG PO TBEC
325.0000 mg | DELAYED_RELEASE_TABLET | Freq: Every day | ORAL | Status: DC
  Filled 2011-09-09: qty 1

## 2011-09-09 MED ORDER — HEPARIN BOLUS VIA INFUSION
3000.0000 [IU] | Freq: Once | INTRAVENOUS | Status: AC
  Administered 2011-09-09: 3000 [IU] via INTRAVENOUS
  Filled 2011-09-09: qty 3000

## 2011-09-09 MED ORDER — INSULIN ASPART 100 UNIT/ML ~~LOC~~ SOLN
0.0000 [IU] | Freq: Every day | SUBCUTANEOUS | Status: DC
  Administered 2011-09-12: 2 [IU] via SUBCUTANEOUS

## 2011-09-09 MED ORDER — ASPIRIN 81 MG PO CHEW
324.0000 mg | CHEWABLE_TABLET | Freq: Once | ORAL | Status: AC
  Administered 2011-09-09: 324 mg via ORAL
  Filled 2011-09-09: qty 4

## 2011-09-09 MED ORDER — METOPROLOL TARTRATE 100 MG PO TABS
100.0000 mg | ORAL_TABLET | Freq: Two times a day (BID) | ORAL | Status: DC
  Administered 2011-09-09 – 2011-09-11 (×6): 100 mg via ORAL
  Filled 2011-09-09 (×8): qty 1

## 2011-09-09 MED ORDER — NITROGLYCERIN 0.2 MG/ML ON CALL CATH LAB
INTRAVENOUS | Status: AC
  Filled 2011-09-09: qty 1

## 2011-09-09 NOTE — Interval H&P Note (Signed)
History and Physical Interval Note:  09/09/2011 4:31 PM  Timothy Olsen  has presented today for surgery, with the diagnosis of chest pain  The various methods of treatment have been discussed with the patient and family. After consideration of risks, benefits and other options for treatment, the patient has consented to  Procedure(s) (LRB): LEFT HEART CATHETERIZATION WITH CORONARY ANGIOGRAM (N/A) as a surgical intervention .  The patient's history has been reviewed, patient examined, no change in status, stable for surgery.  I have reviewed the patient's chart and labs.  Questions were answered to the patient's satisfaction.     Tonny Bollman  09/09/2011 4:31 PM

## 2011-09-09 NOTE — Progress Notes (Signed)
Nursing: Patient found at foot of bed and all electrode removed from his chest. Pt reported that he was trying to get items off the floor. Pt assisted back into bed and placed on monitor. Discussed with the patient the need to use the call button if he needed any thing or assistance. Also discussed with him the importance of calling while on heparin. Teach back methods applied. Patient was able to verbalize in his own words not to get out of bed without assistance and the reason for such. Asked patient to use the hospital provided socks with slip resistant pads- pt refused.

## 2011-09-09 NOTE — Progress Notes (Signed)
Cardiology Progress Note Patient Name: Timothy Olsen Date of Encounter: 09/09/2011, 6:55 AM     Subjective  No overnight events. Patient denies chest pain, sob, palpitations. Reports feeling fatigued.   Objective   Telemetry: Atrial Fibrillation 90-120s  Medications: . aspirin EC  325 mg Oral Once  . aspirin EC  325 mg Oral Daily  . clopidogrel  600 mg Oral Once  . clopidogrel  75 mg Oral Q breakfast  . colchicine  0.6 mg Oral Daily  . diltiazem  20 mg Intravenous Once  . furosemide  40 mg Intravenous BID  . heparin  3,000 Units Intravenous Once  . heparin  4,000 Units Intravenous Once  . insulin aspart  0-15 Units Subcutaneous TID WC  . insulin aspart  0-5 Units Subcutaneous QHS  . lisinopril  20 mg Oral Daily  . metoprolol  100 mg Oral BID   . sodium chloride 20 mL/hr (09/09/11 0400)  . diltiazem (CARDIZEM) infusion 15 mg/hr (09/09/11 0357)  . heparin 1,750 Units/hr (09/09/11 0343)    Physical Exam: Temp:  [97.8 F (36.6 C)-99.9 F (37.7 C)] 97.8 F (36.6 C) (07/30 0344) Pulse Rate:  [32-110] 79  (07/30 0600) Resp:  [13-29] 16  (07/30 0600) BP: (113-163)/(39-113) 129/94 mmHg (07/30 0600) SpO2:  [94 %-100 %] 100 % (07/30 0600) FiO2 (%):  [2 %] 2 % (07/30 0415) Weight:  [222 lb 3.6 oz (100.8 kg)] 222 lb 3.6 oz (100.8 kg) (07/30 0344)  General: Middle aged black male, in no acute distress. Head: Normocephalic, atraumatic, sclera non-icteric, nares are without discharge.  Neck: Supple. Negative for carotid bruits or JVD Lungs: Diminished throughout without wheezes, rales, or rhonchi. Breathing is unlabored. Heart: Irregular S1 S2 without murmurs, rubs, or gallops.  Abdomen: Soft, non-tender, non-distended with normoactive bowel sounds. No rebound/guarding. No obvious abdominal masses. Msk:  Strength and tone appear normal for age. Extremities: 1+ BLE edema. No clubbing or cyanosis. Distal pedal pulses are intact and equal bilaterally. Neuro: Alert and  oriented X 3. Moves all extremities spontaneously. Psych:  Responds to questions appropriately with a flat affect.   Intake/Output Summary (Last 24 hours) at 09/09/11 0655 Last data filed at 09/09/11 0600  Gross per 24 hour  Intake    156 ml  Output      0 ml  Net    156 ml    Labs:  [facility]  09/09/11 0508 09/08/11 1839  NA 139 141  K 3.7 3.9  CL 103 102  CO2 26 29  GLUCOSE 171* 158*  BUN 14 14  CREATININE 0.86 0.96  CALCIUM 8.8 9.0   Basename 09/09/11 0508 09/08/11 1839  AST 134* 225*  ALT 424* 528*  ALKPHOS 153* 176*  BILITOT 1.4* 0.9  PROT 6.1 6.5  ALBUMIN 3.2* 3.3*   Basename 09/09/11 0330 09/08/11 2117  WBC 9.9 9.7  NEUTROABS -- 5.1  HGB 13.4 14.0  HCT 40.6 42.9  MCV 91.9 92.9  PLT 175 186   Basename 09/09/11 0513 09/08/11 1839  CKTOTAL 211 --  CKMB 5.9* --  TROPONINI 1.47* 1.81*     09/09/2011 05:13  Pro B Natriuretic peptide (BNP) 1005.0 (H)   Basename 09/09/11 0508  CHOL 155  HDL 41  LDLCALC 99  TRIG 76  CHOLHDL 3.8   Radiology/Studies:   7/29/201 - Chest 2 View Findings: The heart is enlarged.  The lungs are free of focal consolidations and pleural effusions.  Mild perihilar bronchitic changes are present.  No edema.  Degenerative changes are seen in the spine.  IMPRESSION:  1.  Cardiomegaly without edema. 2.  Bronchitic changes.    09/08/2011 - US Abdomen Complete Findings:  Gallbladder:  Mild gallbladder wall thickening at 4 mm.  No gallstones identified.  Negative sonographic Murphy's sign.  Common bile duct:  Measures within normal limits at 45 mm.  Liver:  No focal lesion identified.  Within normal limits in parenchymal echogenicity.  IVC:  Appears normal.  Pancreas:  No focal abnormality identified within the head/neck. The body and tail are obscured by overlying bowel gas artifact.  Spleen:  Measures 10.1 cm.  No focal abnormality.  Right Kidney:  Measures 11.9 cm.  No hydronephrosis or focal abnormality.  Left Kidney:  Measures 12.0 cm.  No  hydronephrosis or focal abnormality.  Abdominal aorta:  Measures 2.5 cm.  No aneurysmal dilatation identified.  Small right pleural effusion noted.  Small amount of intraperitoneal fluid noted inferior to the liver.  IMPRESSION:  Mild gallbladder wall thickening is nonspecific,can be seen in the setting of inflammation/infection, hypervolemia, or hepatitis.  No cholelithiasis or other sonographic evidence for cholecystitis.  Small amount of free intraperitoneal fluid.  Small right pleural effusion.     Assessment and Plan   1. NSTEMI: NSTEMI in 12/12, no cath at that time due to patient refusal. Has had a couple episodes of chest pain since that time, but denies chest pain now.  Troponin 1.81 --> 1.47 in the setting of a.fib w/ rvr. Agreeable to cardiac catheterization for ischemic evaluation. Cont to cycle enzymes and monitor on telemetry.  Cont IV heparin, ASA, Plavix, BB. No statin due to elevated LFTs.  2. Atrial Fibrillation w/ RVR: Reports history of "irregular heart beat", but no documented A.fib until this admission. Currently with rates 90-120s on IV cardizem 15mg /hr. CHADS2 score 3 (CHF, HTN, DM). Will need chronic anticoagulation, but question compliance. May benefit from newer agent if affordable. Need to consider liver function. Currently on IV heparin. MD please advise on initiation of coumadin/new agent given need for cardiac cath. Cont BB.  3. Acute on chronic heart failure: Likely diastolic and systolic. EF 45% by echo 12/12. BNP 1000. CXR without edema. Mild LE edema, but no overt volume excess on exam. Has not received IV Lasix this admission. Will hold now due to possible cardiac cath today. Cont ACEI, BB. Echo today.   4. Elevated LFTs: Likely related to ETOH abuse. Abd US showed mild nonspecific gallbladder wall thickening without evidence of cholecystitis. Hepatitis panel pending. LFTs improved from yesterday, but still abnormal. Hold statin and cont to monitor.  5. Diabetes  Mellitus: Hold oral hypoglycemics. Cont SSI.  6. Hypertension: Stable on current meds  7. Hyperlipidemia: LDL 99. Holding statin due to elevated LFTs.  8. Alcohol Abuse: Monitor for withdrawal. No signs currently.    Signed, HOPE, JESSICA PA-C  Patient seen and examined.  Long discussion regarding DM and ETOH.  No chest pain presently.  HR is only modestly controlled.  I think he would benefit from diagnostic cath to determine downstream management since he is not on anticoagulation.  I also have great concerns about his anticoagulation management in light of ETOH use, abnormal liver enzymes, and uncertain compliance.  I can speak with his primary, but anatomy prior to the at will be helpful in providing his care.  He is agreeable.

## 2011-09-09 NOTE — Progress Notes (Signed)
ANTICOAGULATION CONSULT NOTE - Follow Up  Pharmacy Consult for heparin Indication: chest pain/ACS  No Known Allergies  Patient Measurements:   Heparin Dosing Weight: 99.1kg  Vital Signs: Temp: 98.4 F (36.9 C) (07/29 1935) Temp src: Oral (07/29 1935) BP: 130/98 mmHg (07/30 0315) Pulse Rate: 85  (07/30 0315)  Labs:  Basename 09/09/11 0214 09/08/11 2117 09/08/11 2116 09/08/11 1839  HGB -- 14.0 -- --  HCT -- 42.9 -- --  PLT -- 186 -- --  APTT -- -- 79* --  LABPROT -- -- 17.8* --  INR -- -- 1.44 --  HEPARINUNFRC <0.10* -- -- --  CREATININE -- -- -- 0.96  CKTOTAL -- -- -- --  CKMB -- -- -- --  TROPONINI -- -- -- 1.81*    The CrCl is unknown because both a height and weight (above a minimum accepted value) are required for this calculation.   Medical History: Past Medical History  Diagnosis Date  . Hypertension   . Diabetes mellitus   . Coronary artery disease   . Irregular heartbeat     Medications:  Prescriptions prior to admission  Medication Sig Dispense Refill  . colchicine 0.6 MG tablet Take 1 tablet (0.6 mg total) by mouth daily. Take 2 now and one in one hour.  Take one twice daily following  20 tablet  0  . glipiZIDE (GLUCOTROL) 5 MG tablet Take 5 mg by mouth 2 (two) times daily before a meal.        . lisinopril (PRINIVIL,ZESTRIL) 20 MG tablet Take 20 mg by mouth daily.        . metFORMIN (GLUCOPHAGE) 500 MG tablet Take 1,000 mg by mouth 2 (two) times daily with a meal.        . metoprolol (LOPRESSOR) 50 MG tablet Take 100 mg by mouth 2 (two) times daily.       . nitroGLYCERIN (NITROSTAT) 0.4 MG SL tablet Place 1 tablet (0.4 mg total) under the tongue every 5 (five) minutes as needed for chest pain.  31 tablet  1  . pravastatin (PRAVACHOL) 40 MG tablet Take 40 mg by mouth once.          Assessment: 33 yom presented to the ED with CP to start heparin. Heparin level (<0.1) is below-goal on 1400 units/hr.   Goal of Therapy:  Heparin level 0.3-0.7  units/ml Monitor platelets by anticoagulation protocol: Yes   Plan:  1. Heparin IV bolus of 3000 units x 1, then increase IV infusion to 1750 units/hr.  2. Heparin level in 6 hours.   Emeline Gins 09/09/2011,3:36 AM

## 2011-09-09 NOTE — Care Management Note (Unsigned)
    Page 1 of 1   09/15/2011     2:33:39 PM   CARE MANAGEMENT NOTE 09/15/2011  Patient:  CHASON, MCIVER   Account Number:  [id number]  Date Initiated:  09/09/2011  Documentation initiated by:  Junius Creamer  Subjective/Objective Assessment:   adm w mi     Action/Plan:   lives alone, pcp dr Casimiro Needle hilts   Anticipated DC Date:     Anticipated DC Plan:    In-house referral  Clinical Social Worker      DC Planning Services  CM consult      Choice offered to / List presented to:             Status of service:  In process, will continue to follow Medicare Important Message given?   (If response is "NO", the following Medicare IM given date fields will be blank) Date Medicare IM given:   Date Additional Medicare IM given:    Discharge Disposition:    Per UR Regulation:  Reviewed for med. necessity/level of care/duration of stay  If discussed at Long Length of Stay Meetings, dates discussed:    Comments:  09/15/11 Jermar Colter,RN,BSN 1400 PT S/P TEE/CARDIOVERSION THIS AM.  PTA, PT INDEPENDENT, LIVES ALONE.  PT HAS RX COVERAGE...WILL PROVIDE XARELTO COPAY CARD, AS PT TO DC ON XARELTO.  7/31 10:23a debbie dowell rn,bsn sw ref for etoh use. will get cm sec to be sure his cign[employer] ins has medication benefits. await rec for hhc from [facility].pt does have medication coverage w cigna ins.  7/307/30 11:14a debbie dowell rn,bsn [phone]

## 2011-09-09 NOTE — H&P (Signed)
CC: NSTEMI, AF RVR  History of Present Illness: Timothy Olsen is a 56 y.o. male with a past medical history significant for diabetes, hypertension, hyperlipidemia, and depressed left ventricular function with an ejection fraction of 40-45% who presents for evaluation of a non-ST segment elevation myocardial infarction. Historically, the patient has been hospitalized several times over the past one year. He was seen by [facility] cardiology who recommended performing cardiac catheterization. For unclear reasons, the patient refused to have this done. He's never had a documented myocardial infarction, but did have depressed left ventricular function.  He reports that since Friday, he has felt "sick and". He describes a sensation of feeling bad with fatigue and mild left-sided chest pain which radiated to his back. There was some associated shortness of breath and palpitations. Because of these symptoms, he took 5 nitroglycerin Friday, which resolved his pain. He had intermittent symptoms on Saturday and Sunday. He went to work on Monday and felt okay and then had worsening symptoms of fatigue on Monday afternoon. He came to the emergency department where he was noted to be in atrial fibrillation with rapid ventricular response. His troponin was elevated at 1.8. He was started on aspirin, IV diltiazem, and IV heparin, and cardiology was consult.  Currently, he is chest pain free.  PMH: 1. HTN 2. HLD 3. DM 4. Depressed LV function with EF 40-45%. 5. Alcohol use.  SOCIAL HISTORY: The patient is a Copy at [employer].  He denies tobacc or drug use.  He drinks alcohol 3-4 times per week.   FAMILY HISTORY: There is no family history of MI.   REVIEW OF SYSTEMS: All systems reviewed and are negative except as mentioned above in the HPI.    MEDICATIONS: Current Facility-Administered Medications on File Prior to Encounter  Medication Dose Route Frequency Provider Last Rate Last Dose  . methylPREDNISolone  acetate (DEPO-MEDROL) injection 80 mg  80 mg Intramuscular Once Phillips Odor, MD       Current Outpatient Prescriptions on File Prior to Encounter  Medication Sig Dispense Refill  . colchicine 0.6 MG tablet Take 1 tablet (0.6 mg total) by mouth daily. Take 2 now and one in one hour.  Take one twice daily following  20 tablet  0  . glipiZIDE (GLUCOTROL) 5 MG tablet Take 5 mg by mouth 2 (two) times daily before a meal.        . lisinopril (PRINIVIL,ZESTRIL) 20 MG tablet Take 20 mg by mouth daily.        . metFORMIN (GLUCOPHAGE) 500 MG tablet Take 1,000 mg by mouth 2 (two) times daily with a meal.        . metoprolol (LOPRESSOR) 50 MG tablet Take 100 mg by mouth 2 (two) times daily.       . nitroGLYCERIN (NITROSTAT) 0.4 MG SL tablet Place 1 tablet (0.4 mg total) under the tongue every 5 (five) minutes as needed for chest pain.  31 tablet  1  . pravastatin (PRAVACHOL) 40 MG tablet Take 40 mg by mouth once.           ALLERGY: No Known Allergies   PHYSICAL EXAMINATION: Filed Vitals:   09/09/11 0100  BP: 132/111  Pulse: 43  Temp:   Resp: 28     GENERAL: well developed, well nourished, no acute distress EYES: PERRL, EOMI ENT: Oropharynx is clear.  Dentition is within normal limits NECK:  There is JVD to mandible.  No carotid bruits. No masses HEART: Irregularly irregular with no  murmurs. LUNGS: Crackles B.  ABDOMEN:  +BS, soft, NTND, HSM is present EXTREMITIES:  No clubbing, cyanosis, or edema. PULSES:  Femoral pulses are 2+ bilaterally without bruits.   NEUROLOGIC: AOx3, no focal deficits SKIN: No rashes PSYCH:  Normal judgment and insight, mood is appropriate.  EKG: AF RVR with possible limb lead reversal.  Lateral T-wave changes.  Current tele shows HR of 95 BPM.   Results for orders placed during the hospital encounter of 09/08/11 (from the past 24 hour(s))  TROPONIN I     Status: Abnormal   Collection Time   09/08/11  6:39 PM      Component Value Range   Troponin I 1.81  (*) <0.30 ng/mL  COMPREHENSIVE METABOLIC PANEL     Status: Abnormal   Collection Time   09/08/11  6:39 PM      Component Value Range   Sodium 141  135 - 145 mEq/L   Potassium 3.9  3.5 - 5.1 mEq/L   Chloride 102  96 - 112 mEq/L   CO2 29  19 - 32 mEq/L   Glucose, Bld 158 (*) 70 - 99 mg/dL   BUN 14  6 - 23 mg/dL   Creatinine, Ser 4.09  0.50 - 1.35 mg/dL   Calcium 9.0  8.4 - 81.1 mg/dL   Total Protein 6.5  6.0 - 8.3 g/dL   Albumin 3.3 (*) 3.5 - 5.2 g/dL   AST 914 (*) 0 - 37 U/L   ALT 528 (*) 0 - 53 U/L   Alkaline Phosphatase 176 (*) 39 - 117 U/L   Total Bilirubin 0.9  0.3 - 1.2 mg/dL   GFR calc non Af Amer >90  >90 mL/min   GFR calc Af Amer >90  >90 mL/min  APTT     Status: Abnormal   Collection Time   09/08/11  9:16 PM      Component Value Range   aPTT 79 (*) 24 - 37 seconds  PROTIME-INR     Status: Abnormal   Collection Time   09/08/11  9:16 PM      Component Value Range   Prothrombin Time 17.8 (*) 11.6 - 15.2 seconds   INR 1.44  0.00 - 1.49  CBC     Status: Abnormal   Collection Time   09/08/11  9:17 PM      Component Value Range   WBC 9.7  4.0 - 10.5 K/uL   RBC 4.62  4.22 - 5.81 MIL/uL   Hemoglobin 14.0  13.0 - 17.0 g/dL   HCT 78.2  95.6 - 21.3 %   MCV 92.9  78.0 - 100.0 fL   MCH 30.3  26.0 - 34.0 pg   MCHC 32.6  30.0 - 36.0 g/dL   RDW 08.6 (*) 57.8 - 46.9 %   Platelets 186  150 - 400 K/uL  DIFFERENTIAL     Status: Abnormal   Collection Time   09/08/11  9:17 PM      Component Value Range   Neutrophils Relative 53  43 - 77 %   Neutro Abs 5.1  1.7 - 7.7 K/uL   Lymphocytes Relative 30  12 - 46 %   Lymphs Abs 2.9  0.7 - 4.0 K/uL   Monocytes Relative 15 (*) 3 - 12 %   Monocytes Absolute 1.4 (*) 0.1 - 1.0 K/uL   Eosinophils Relative 2  0 - 5 %   Eosinophils Absolute 0.2  0.0 - 0.7 K/uL   Basophils Relative 0  0 - 1 %   Basophils Absolute 0.0  0.0 - 0.1 K/uL   Dg Chest 2 View  09/08/2011  *RADIOLOGY REPORT*  Clinical Data: Chest pain on the left side.  History of  hypertension, CAD, diabetes.  CHEST - 2 VIEW  Comparison: 01/21/2011  Findings: The heart is enlarged.  The lungs are free of focal consolidations and pleural effusions.  Mild perihilar bronchitic changes are present.  No edema.  Degenerative changes are seen in the spine.  IMPRESSION:  1.  Cardiomegaly without edema. 2.  Bronchitic changes.  Original Report Authenticated By: Patterson Hammersmith, M.D.   US Abdomen Complete  09/08/2011  *RADIOLOGY REPORT*  Clinical Data:  Elevated LFTs  COMPLETE ABDOMINAL ULTRASOUND  Comparison:  None.  Findings:  Gallbladder:  Mild gallbladder wall thickening at 4 mm.  No gallstones identified.  Negative sonographic Murphy's sign.  Common bile duct:  Measures within normal limits at 45 mm.  Liver:  No focal lesion identified.  Within normal limits in parenchymal echogenicity.  IVC:  Appears normal.  Pancreas:  No focal abnormality identified within the head/neck. The body and tail are obscured by overlying bowel gas artifact.  Spleen:  Measures 10.1 cm.  No focal abnormality.  Right Kidney:  Measures 11.9 cm.  No hydronephrosis or focal abnormality.  Left Kidney:  Measures 12.0 cm.  No hydronephrosis or focal abnormality.  Abdominal aorta:  Measures 2.5 cm.  No aneurysmal dilatation identified.  Small right pleural effusion noted.  Small amount of intraperitoneal fluid noted inferior to the liver.  IMPRESSION:  Mild gallbladder wall thickening is nonspecific,can be seen in the setting of inflammation/infection, hypervolemia, or hepatitis.  No cholelithiasis or other sonographic evidence for cholecystitis.  Small amount of free intraperitoneal fluid.  Small right pleural effusion.  Original Report Authenticated By: Waneta Martins, M.D.    ASSESSMENT AND PLAN: 56 year old, African male with a past medical history significant for diabetes, hypertension, hyperlipidemia, and depressed left ventricular function who presents with a non-ST segment elevation myocardial infarction  complicated by atrial fibrillation with rapid ventricular response and mild heart failure.  1: Admit to [facility] cardiology. ICU.  2: Non-ST segment elevation myocardial infarction. Currently, the patient is chest pain-free. I think that he would benefit from an invasive evaluation cardiac catheterization. In the interim time, we'll continue an aspirin 325 mg daily, as well as IV heparin. We will load him with Plavix. And continued on 75mg  daily. We'll continue his home dose of Lopressor 100 mg twice a day. We'll not use a statin as he has elevated LFTs.  3: Atrial fibrillation with rapid ventricular response. His atrial fibrillation appears to be a new diagnosis. He is currently on 100 mg twice a day of Lopressor at home. He is currently on IV diltiazem drip for acute rate control. Will continue on heparin drip as he has an elevated CHADS2 score. He will need long-term anticoagulation. However, this will have to be weighed against his elevated LFTs.   4: Acute heart failure. This is likely secondary to his myocardial infarction as well as atrial fibrillation. It is likely combined systolic and diastolic heart failure. We'll give him Lasix 40 mg IV x1. We'll check an echocardiogram. Continue ACEI and BB.   5: Diabetes mellitus. Hold oral medications. Place patient on scale insulin.   6: Elevated LFTs. I suspect this is likely related to alcohol abuse. However, we need to check a hepatitis panel. His ultrasound demonstrated nonspecific findings. Will check daily CMPs.  7: FEN: SLIVF, Electrolytes are stable, NPO after midnight.    8: DVT prophylaxis: N/A as on IV heparin.  Bernestine Amass. Mayford Knife, MD Level 3 H&P

## 2011-09-09 NOTE — Progress Notes (Signed)
Echocardiogram 2D Echocardiogram has been performed.  Kadijah Shamoon 09/09/2011, 10:58 AM

## 2011-09-09 NOTE — Progress Notes (Signed)
Received order on admit. Will not follow until pt has revascularization unless o/w specified. Thx. Ethelda Chick CES, ACSM

## 2011-09-09 NOTE — H&P (View-Only) (Signed)
     Cardiology Progress Note Patient Name: Timothy Olsen Date of Encounter: 09/09/2011, 6:55 AM     Subjective  No overnight events. Patient denies chest pain, sob, palpitations. Reports feeling fatigued.   Objective   Telemetry: Atrial Fibrillation 90-120s  Medications: . aspirin EC  325 mg Oral Once  . aspirin EC  325 mg Oral Daily  . clopidogrel  600 mg Oral Once  . clopidogrel  75 mg Oral Q breakfast  . colchicine  0.6 mg Oral Daily  . diltiazem  20 mg Intravenous Once  . furosemide  40 mg Intravenous BID  . heparin  3,000 Units Intravenous Once  . heparin  4,000 Units Intravenous Once  . insulin aspart  0-15 Units Subcutaneous TID WC  . insulin aspart  0-5 Units Subcutaneous QHS  . lisinopril  20 mg Oral Daily  . metoprolol  100 mg Oral BID   . sodium chloride 20 mL/hr (09/09/11 0400)  . diltiazem (CARDIZEM) infusion 15 mg/hr (09/09/11 0357)  . heparin 1,750 Units/hr (09/09/11 0343)    Physical Exam: Temp:  [97.8 F (36.6 C)-99.9 F (37.7 C)] 97.8 F (36.6 C) (07/30 0344) Pulse Rate:  [32-110] 79  (07/30 0600) Resp:  [13-29] 16  (07/30 0600) BP: (113-163)/(39-113) 129/94 mmHg (07/30 0600) SpO2:  [94 %-100 %] 100 % (07/30 0600) FiO2 (%):  [2 %] 2 % (07/30 0415) Weight:  [222 lb 3.6 oz (100.8 kg)] 222 lb 3.6 oz (100.8 kg) (07/30 0344)  General: Middle aged black male, in no acute distress. Head: Normocephalic, atraumatic, sclera non-icteric, nares are without discharge.  Neck: Supple. Negative for carotid bruits or JVD Lungs: Diminished throughout without wheezes, rales, or rhonchi. Breathing is unlabored. Heart: Irregular S1 S2 without murmurs, rubs, or gallops.  Abdomen: Soft, non-tender, non-distended with normoactive bowel sounds. No rebound/guarding. No obvious abdominal masses. Msk:  Strength and tone appear normal for age. Extremities: 1+ BLE edema. No clubbing or cyanosis. Distal pedal pulses are intact and equal bilaterally. Neuro: Alert and  oriented X 3. Moves all extremities spontaneously. Psych:  Responds to questions appropriately with a flat affect.   Intake/Output Summary (Last 24 hours) at 09/09/11 0655 Last data filed at 09/09/11 0600  Gross per 24 hour  Intake    156 ml  Output      0 ml  Net    156 ml    Labs:  Basename 09/09/11 0508 09/08/11 1839  NA 139 141  K 3.7 3.9  CL 103 102  CO2 26 29  GLUCOSE 171* 158*  BUN 14 14  CREATININE 0.86 0.96  CALCIUM 8.8 9.0   Basename 09/09/11 0508 09/08/11 1839  AST 134* 225*  ALT 424* 528*  ALKPHOS 153* 176*  BILITOT 1.4* 0.9  PROT 6.1 6.5  ALBUMIN 3.2* 3.3*   Basename 09/09/11 0330 09/08/11 2117  WBC 9.9 9.7  NEUTROABS -- 5.1  HGB 13.4 14.0  HCT 40.6 42.9  MCV 91.9 92.9  PLT 175 186   Basename 09/09/11 0513 09/08/11 1839  CKTOTAL 211 --  CKMB 5.9* --  TROPONINI 1.47* 1.81*     09/09/2011 05:13  Pro B Natriuretic peptide (BNP) 1005.0 (H)   Basename 09/09/11 0508  CHOL 155  HDL 41  LDLCALC 99  TRIG 76  CHOLHDL 3.8   Radiology/Studies:   7/29/201 - Chest 2 View Findings: The heart is enlarged.  The lungs are free of focal consolidations and pleural effusions.  Mild perihilar bronchitic changes are present.    No edema.  Degenerative changes are seen in the spine.  IMPRESSION:  1.  Cardiomegaly without edema. 2.  Bronchitic changes.    09/08/2011 - Us Abdomen Complete Findings:  Gallbladder:  Mild gallbladder wall thickening at 4 mm.  No gallstones identified.  Negative sonographic Murphy's sign.  Common bile duct:  Measures within normal limits at 45 mm.  Liver:  No focal lesion identified.  Within normal limits in parenchymal echogenicity.  IVC:  Appears normal.  Pancreas:  No focal abnormality identified within the head/neck. The body and tail are obscured by overlying bowel gas artifact.  Spleen:  Measures 10.1 cm.  No focal abnormality.  Right Kidney:  Measures 11.9 cm.  No hydronephrosis or focal abnormality.  Left Kidney:  Measures 12.0 cm.  No  hydronephrosis or focal abnormality.  Abdominal aorta:  Measures 2.5 cm.  No aneurysmal dilatation identified.  Small right pleural effusion noted.  Small amount of intraperitoneal fluid noted inferior to the liver.  IMPRESSION:  Mild gallbladder wall thickening is nonspecific,can be seen in the setting of inflammation/infection, hypervolemia, or hepatitis.  No cholelithiasis or other sonographic evidence for cholecystitis.  Small amount of free intraperitoneal fluid.  Small right pleural effusion.     Assessment and Plan   1. NSTEMI: NSTEMI in 12/12, no cath at that time due to patient refusal. Has had a couple episodes of chest pain since that time, but denies chest pain now.  Troponin 1.81 --> 1.47 in the setting of a.fib w/ rvr. Agreeable to cardiac catheterization for ischemic evaluation. Cont to cycle enzymes and monitor on telemetry.  Cont IV heparin, ASA, Plavix, BB. No statin due to elevated LFTs.  2. Atrial Fibrillation w/ RVR: Reports history of "irregular heart beat", but no documented A.fib until this admission. Currently with rates 90-120s on IV cardizem 15mg/hr. CHADS2 score 3 (CHF, HTN, DM). Will need chronic anticoagulation, but question compliance. May benefit from newer agent if affordable. Need to consider liver function. Currently on IV heparin. MD please advise on initiation of coumadin/new agent given need for cardiac cath. Cont BB.  3. Acute on chronic heart failure: Likely diastolic and systolic. EF 45% by echo 12/12. BNP 1000. CXR without edema. Mild LE edema, but no overt volume excess on exam. Has not received IV Lasix this admission. Will hold now due to possible cardiac cath today. Cont ACEI, BB. Echo today.   4. Elevated LFTs: Likely related to ETOH abuse. Abd US showed mild nonspecific gallbladder wall thickening without evidence of cholecystitis. Hepatitis panel pending. LFTs improved from yesterday, but still abnormal. Hold statin and cont to monitor.  5. Diabetes  Mellitus: Hold oral hypoglycemics. Cont SSI.  6. Hypertension: Stable on current meds  7. Hyperlipidemia: LDL 99. Holding statin due to elevated LFTs.  8. Alcohol Abuse: Monitor for withdrawal. No signs currently.    Signed, HOPE, JESSICA PA-C  Patient seen and examined.  Long discussion regarding DM and ETOH.  No chest pain presently.  HR is only modestly controlled.  I think he would benefit from diagnostic cath to determine downstream management since he is not on anticoagulation.  I also have great concerns about his anticoagulation management in light of ETOH use, abnormal liver enzymes, and uncertain compliance.  I can speak with his primary, but anatomy prior to the at will be helpful in providing his care.  He is agreeable.     

## 2011-09-09 NOTE — Progress Notes (Signed)
ANTICOAGULATION CONSULT NOTE - Follow Up  Pharmacy Consult for Heparin Indication: NSTEMI and Afib  No Known Allergies  Patient Measurements: Height: 6\' 4"  (193 cm) Weight: 222 lb 3.6 oz (100.8 kg) IBW/kg (Calculated) : 86.8  Heparin Dosing Weight: 99.1kg  Vital Signs: Temp: 97 F (36.1 C) (07/30 1745) Temp src: Oral (07/30 1745) BP: 150/89 mmHg (07/30 1800) Pulse Rate: 90  (07/30 1557)  Labs:  Alvira Philips 09/09/11 1614 09/09/11 0949 09/09/11 0513 09/09/11 0508 09/09/11 0330 09/09/11 0214 09/08/11 2117 09/08/11 2116 09/08/11 1839  HGB -- -- -- -- 13.4 -- 14.0 -- --  HCT -- -- -- -- 40.6 -- 42.9 -- --  PLT -- -- -- -- 175 -- 186 -- --  APTT -- -- -- -- -- -- -- 79* --  LABPROT -- -- -- -- -- -- -- 17.8* --  INR -- -- -- -- -- -- -- 1.44 --  HEPARINUNFRC -- 0.12* -- -- -- <0.10* -- -- --  CREATININE -- -- -- 0.86 -- -- -- -- 0.96  CKTOTAL 170 186 211 -- -- -- -- -- --  CKMB 4.4* 4.9* 5.9* -- -- -- -- -- --  TROPONINI 0.98* 1.22* 1.47* -- -- -- -- -- --    Assessment: 56 yo male who presented to the ED with CP to start heparin. Heparin rate was increased to 2050 units/hr with no subsequent heparin level due to cath earlier today.  Pharmacy consulted to resume heparin 6 hrs after sheath removal for Afib. Sheath was removed at 17:25. No bleeding reported. Spoke with RN about plan.  Goal of Therapy:  Heparin level 0.3-0.7 units/ml Monitor platelets by anticoagulation protocol: Yes   Plan:  -Resume heparin drip at 2050 units/hr with no bolus at 23:30 -Heparin level 6 hrs after resumed -Daily heparin level and CBC while on heparin -F/U long term anticoagulation plans   [facility], [place].D., BCPS Clinical Pharmacist Pager: [phone] 09/09/2011 6:43 PM

## 2011-09-09 NOTE — Progress Notes (Signed)
ANTICOAGULATION CONSULT NOTE - Follow Up  Pharmacy Consult for heparin Indication: chest pain/ACS  No Known Allergies  Patient Measurements: Height: 6\' 4"  (193 cm) Weight: 222 lb 3.6 oz (100.8 kg) IBW/kg (Calculated) : 86.8  Heparin Dosing Weight: 99.1kg  Vital Signs: Temp: 98.9 F (37.2 C) (07/30 0800) Temp src: Oral (07/30 0800) BP: 144/82 mmHg (07/30 1100) Pulse Rate: 52  (07/30 1000)  Labs:  Alvira Philips 09/09/11 0949 09/09/11 0513 09/09/11 0508 09/09/11 0330 09/09/11 0214 09/08/11 2117 09/08/11 2116 09/08/11 1839  HGB -- -- -- 13.4 -- 14.0 -- --  HCT -- -- -- 40.6 -- 42.9 -- --  PLT -- -- -- 175 -- 186 -- --  APTT -- -- -- -- -- -- 79* --  LABPROT -- -- -- -- -- -- 17.8* --  INR -- -- -- -- -- -- 1.44 --  HEPARINUNFRC 0.12* -- -- -- <0.10* -- -- --  CREATININE -- -- 0.86 -- -- -- -- 0.96  CKTOTAL 186 211 -- -- -- -- -- --  CKMB 4.9* 5.9* -- -- -- -- -- --  TROPONINI 1.22* 1.47* -- -- -- -- -- 1.81*    Assessment: 55 yom presented to the ED with CP to start heparin. Heparin level (0.12) is below-goal on 1750 units/hr. Patient noted for cath today.  Goal of Therapy:  Heparin level 0.3-0.7 units/ml Monitor platelets by anticoagulation protocol: Yes   Plan:  -No rebolus since cath later today -Increase heparin to 2050/hr -Will follow post cath  Harland German, Pharm D 09/09/2011 11:58 AM

## 2011-09-09 NOTE — CV Procedure (Signed)
   Cardiac Catheterization Procedure Note  Name: Timothy Olsen MRN: [id number] DOB: Jan 19, 1956  Procedure: Left Heart Cath, Selective Coronary Angiography, LV angiography  Indication: Non-ST elevation MI in the setting of atrial fibrillation with RVR  Procedural details: The right groin was prepped, draped, and anesthetized with 1% lidocaine. Using modified Seldinger technique, a 5 French sheath was introduced into the right femoral artery. Standard Judkins catheters were used for coronary angiography and left ventriculography. Catheter exchanges were performed over a guidewire. There were no immediate procedural complications. The patient was transferred to the post catheterization recovery area for further monitoring.  Procedural Findings: Hemodynamics:  AO 139/97 LV 139/29   Coronary angiography: Coronary dominance: right  Left mainstem: Widely patent with minor luminal irregularity.  Left anterior descending (LAD): Patent to the left ventricular apex. The first diagonal branch is patent. There is mild proximal LAD calcification and also mild luminal irregularity but there are no significant stenoses throughout the course of the LAD distribution.  Left circumflex (LCx): The left circumflex is a large-caliber vessel. The proximal and mid circumflex have mild diffuse irregularity. The first OM is patent but in the midportion of this vessel there is 50-75% stenosis. There is also a subbranch that has tight ostial stenosis but this is a very small vessel. The percent stenosis is estimated at 80-90. The continuation of the AV groove circumflex supplies a posterolateral branch which also has 80-90% stenosis. This is a very small vessel less than 1.5 mm in caliber.  Right coronary artery (RCA): The right coronary artery is dominant. There is mild luminal irregularity in the proximal vessel. The mid vessel has 40-50% stenosis. The distal vessel has luminal irregularity. The PDA and  posterolateral branches are patent  Left ventriculography: The left ventricle is poorly visualized with ventriculography, despite a 30 cc injection. However, there is clearly global left ventricular dysfunction and I would estimate the ejection fraction in the range of 30%.  Final Conclusions:   1. 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 Nonobstructive LAD and right coronary artery stenoses 3. Severe global left ventricular dysfunction  Recommendations: Medical therapy. Plan will have to be made regarding anticoagulation in this patient with atrial fibrillation and cardiomyopathy, but this is complicated by concerns about medicine adherence and alcohol use.  Tonny Bollman 09/09/2011, 4:54 PM

## 2011-09-10 ENCOUNTER — Encounter (HOSPITAL_COMMUNITY): Payer: Self-pay | Admitting: *Deleted

## 2011-09-10 DIAGNOSIS — I214 Non-ST elevation (NSTEMI) myocardial infarction: Secondary | ICD-10-CM

## 2011-09-10 DIAGNOSIS — I5043 Acute on chronic combined systolic (congestive) and diastolic (congestive) heart failure: Secondary | ICD-10-CM

## 2011-09-10 DIAGNOSIS — I4891 Unspecified atrial fibrillation: Secondary | ICD-10-CM

## 2011-09-10 LAB — COMPREHENSIVE METABOLIC PANEL
AST: 60 U/L — ABNORMAL HIGH (ref 0–37)
Alkaline Phosphatase: 142 U/L — ABNORMAL HIGH (ref 39–117)
BUN: 12 mg/dL (ref 6–23)
CO2: 27 mEq/L (ref 19–32)
Chloride: 102 mEq/L (ref 96–112)
Creatinine, Ser: 0.84 mg/dL (ref 0.50–1.35)
GFR calc non Af Amer: >90 mL/min (ref >90)
Potassium: 4 mEq/L (ref 3.5–5.1)
Total Bilirubin: 1.2 mg/dL (ref 0.3–1.2)

## 2011-09-10 LAB — GLUCOSE, CAPILLARY
Glucose-Capillary: 182 mg/dL — ABNORMAL HIGH (ref 70–99)
Glucose-Capillary: 124 mg/dL — ABNORMAL HIGH (ref 70–99)
Glucose-Capillary: 175 mg/dL — ABNORMAL HIGH (ref 70–99)
Glucose-Capillary: 154 mg/dL — ABNORMAL HIGH (ref 70–99)

## 2011-09-10 LAB — CBC
HCT: 42.2 % (ref 39.0–52.0)
Hemoglobin: 13.4 g/dL (ref 13.0–17.0)
MCV: 93.8 fL (ref 78.0–100.0)
RDW: 16.9 % — ABNORMAL HIGH (ref 11.5–15.5)
WBC: 8 10*3/uL (ref 4.0–10.5)

## 2011-09-10 LAB — PROTIME-INR
INR: 1.34 (ref 0.00–1.49)
Prothrombin Time: 16.8 seconds — ABNORMAL HIGH (ref 11.6–15.2)

## 2011-09-10 MED ORDER — LORAZEPAM 1 MG PO TABS
1.0000 mg | ORAL_TABLET | Freq: Four times a day (QID) | ORAL | Status: DC | PRN
  Administered 2011-09-12: 1 mg via ORAL
  Filled 2011-09-10 (×4): qty 1

## 2011-09-10 MED ORDER — VITAMIN B-1 100 MG PO TABS
100.0000 mg | ORAL_TABLET | Freq: Every day | ORAL | Status: DC
  Administered 2011-09-10 – 2011-09-12 (×3): 100 mg via ORAL
  Filled 2011-09-10 (×3): qty 1

## 2011-09-10 MED ORDER — LORAZEPAM 1 MG PO TABS
0.0000 mg | ORAL_TABLET | Freq: Four times a day (QID) | ORAL | Status: DC
  Administered 2011-09-10 – 2011-09-11 (×2): 1 mg via ORAL
  Filled 2011-09-10: qty 1

## 2011-09-10 MED ORDER — MAGNESIUM HYDROXIDE 400 MG/5ML PO SUSP: 30.0000 mL | Freq: Every day | ORAL | Status: DC | PRN

## 2011-09-10 MED ORDER — ASPIRIN EC 81 MG PO TBEC
81.0000 mg | DELAYED_RELEASE_TABLET | Freq: Every day | ORAL | Status: DC
  Administered 2011-09-10 – 2011-09-16 (×7): 81 mg via ORAL
  Filled 2011-09-10 (×7): qty 1

## 2011-09-10 MED ORDER — WARFARIN SODIUM 5 MG PO TABS
5.0000 mg | ORAL_TABLET | Freq: Once | ORAL | Status: AC
  Administered 2011-09-10: 5 mg via ORAL
  Filled 2011-09-10: qty 1

## 2011-09-10 MED ORDER — DIGOXIN 250 MCG PO TABS
0.5000 mg | ORAL_TABLET | Freq: Once | ORAL | Status: AC
  Administered 2011-09-10: 0.5 mg via ORAL
  Filled 2011-09-10: qty 2

## 2011-09-10 MED ORDER — LORAZEPAM 2 MG/ML IJ SOLN: 1.0000 mg | Freq: Four times a day (QID) | INTRAMUSCULAR | Status: DC | PRN

## 2011-09-10 MED ORDER — THIAMINE HCL 100 MG/ML IJ SOLN
100.0000 mg | Freq: Every day | INTRAMUSCULAR | Status: DC
  Filled 2011-09-10 (×3): qty 1

## 2011-09-10 MED ORDER — WARFARIN VIDEO
Freq: Once | Status: AC
  Administered 2011-09-10: 15:00:00

## 2011-09-10 MED ORDER — WARFARIN - PHARMACIST DOSING INPATIENT: Freq: Every day | Status: DC

## 2011-09-10 MED ORDER — LORAZEPAM 1 MG PO TABS
0.0000 mg | ORAL_TABLET | Freq: Two times a day (BID) | ORAL | Status: DC
  Administered 2011-09-12: 1 mg via ORAL
  Filled 2011-09-10: qty 1

## 2011-09-10 MED ORDER — COUMADIN BOOK
Freq: Once | Status: AC
  Administered 2011-09-10: 15:00:00
  Filled 2011-09-10: qty 1

## 2011-09-10 MED ORDER — ADULT MULTIVITAMIN W/MINERALS CH
1.0000 | ORAL_TABLET | Freq: Every day | ORAL | Status: DC
  Administered 2011-09-10 – 2011-09-12 (×3): 1 via ORAL
  Filled 2011-09-10 (×3): qty 1

## 2011-09-10 MED ORDER — FOLIC ACID 1 MG PO TABS
1.0000 mg | ORAL_TABLET | Freq: Every day | ORAL | Status: DC
  Administered 2011-09-10 – 2011-09-16 (×7): 1 mg via ORAL
  Filled 2011-09-10 (×7): qty 1

## 2011-09-10 MED ORDER — OFF THE BEAT BOOK
Freq: Once | Status: AC
  Administered 2011-09-10: 23:00:00
  Filled 2011-09-10: qty 1

## 2011-09-10 NOTE — Progress Notes (Signed)
ANTICOAGULATION CONSULT NOTE - Follow Up  Pharmacy Consult for Heparin Indication:  Afib  No Known Allergies  Patient Measurements: Height: 6\' 4"  (193 cm) Weight: 222 lb 3.6 oz (100.8 kg) IBW/kg (Calculated) : 86.8  Heparin Dosing Weight: 99.1kg  Vital Signs: Temp: 98.9 F (37.2 C) (07/31 0800) Temp src: Oral (07/31 0800) BP: 153/88 mmHg (07/31 0800) Pulse Rate: 110  (07/31 0800)  Labs:  Alvira Philips 09/10/11 0830 09/10/11 0540 09/09/11 1614 09/09/11 0949 09/09/11 0513 09/09/11 0508 09/09/11 0330 09/09/11 0214 09/08/11 2117 09/08/11 2116 09/08/11 1839  HGB 13.4 -- -- -- -- -- 13.4 -- -- -- --  HCT 42.2 -- -- -- -- -- 40.6 -- 42.9 -- --  PLT 171 -- -- -- -- -- 175 -- 186 -- --  APTT -- -- -- -- -- -- -- -- -- 79* --  LABPROT 16.8* -- -- -- -- -- -- -- -- 17.8* --  INR 1.34 -- -- -- -- -- -- -- -- 1.44 --  HEPARINUNFRC 0.41 -- -- 0.12* -- -- -- <0.10* -- -- --  CREATININE -- 0.84 -- -- -- 0.86 -- -- -- -- 0.96  CKTOTAL -- -- 170 186 211 -- -- -- -- -- --  CKMB -- -- 4.4* 4.9* 5.9* -- -- -- -- -- --  TROPONINI -- -- 0.98* 1.22* 1.47* -- -- -- -- -- --    Assessment: 56 yo male who presented to the ED with CP/afib now s/p cath on heparin. Patient noted with CHADS2= 3 but concerns for compliance and alcohol use.  Heparin is at goal (0.41) with Hg=13.4 and plt=171.  Goal of Therapy:  Heparin level 0.3-0.7 units/ml Monitor platelets by anticoagulation protocol: Yes   Plan:  -No heparin changes needed -Will follow AC plans  Harland German, Pharm D 09/10/2011 10:48 AM

## 2011-09-10 NOTE — Progress Notes (Signed)
Pharmacy: Coumadin   56 yo male with afib on heparin and to add coumadin today (baseline INR=1.34 on 09/10/11). Patient noted with history of alcoholism and with elevated LFTs (60/303) with trend down.  Plan -Will start coumadin at 5mg  today and consider a reduced dose in am -Daily PT/INR -Will begin education process  Harland German, Ilda Basset D 09/10/2011 3:08 PM

## 2011-09-10 NOTE — Progress Notes (Signed)
Pt having nose bleed and coughing up bright red blood from throat and spitting in trash can. Pt yelling at nurse stating "cut that blood thinner off! You are going to [place] someone!" pt very agitated. Heparin drip stopped. Nasal bleed stopped with having Pt hold head back and apply pressure to nasal bridge. Ice pack also applied to bridge of nose. Explained to Pt that without the heparin he is at risk for throwing a clot from his heart to his brain and having a stroke. Informed Pt of side effects of heparin and coumadin. Explained to Pt that we could draw his blood and check the level to make sure his blood was not to thin. Pt refused this as well and  stated " I do not want any more blood thinners" he continued to be irate.  Dwaine Deter PA  notified of above and pt will be further assessed by MD on am rounds.  Will continue to monitor patient.

## 2011-09-10 NOTE — Progress Notes (Signed)
CARDIAC REHAB PHASE I   PRE:  Rate/Rhythm: walked with NT    BP: sitting     SaO2:   MODE:  Ambulation: 350 ft   POST:  Rate/Rhythm: 100 afib    BP: sitting 148/94     SaO2:   Pt just back from walking around unit. Sts he was SOB. Monitor tech sts HR remained stable, in 90s mostly. BP elevated. Began ed. Pt tends to have his own ideas of how to manage his health. Left HF and MI book, also diets. Pt attempting to understand Afib so discussed this. Pt sts he guesses he will quit drinking but when asked about his social support/who he lives with he became defensive and asked me not to talk to him about "personal things" like that. Will f/u tomorrow. [phone]  Harriet Masson CES, ACSM

## 2011-09-10 NOTE — Progress Notes (Signed)
Patient: Timothy Olsen Date of Encounter: 09/10/2011, 8:49 AM Admit date: 09/08/2011     Subjective  Feels somewhat better today. Slept on his side overnight fairly flat. No CP. HR still up. He knows he needs to quit drinking. He is concerned about side effects of increasing medicines.  He tells me in October he was at [facility] and diagnosed with a "blood clot on my lung." I find no record of this. He was at [facility] in 01/2011 but there is no diagnoses of PE at that time that I can see.   Objective   Telemetry: afib 90s-120s with multifocal PVCs Physical Exam: Filed Vitals:   09/10/11 0800  BP: 153/88  Pulse: 110  Temp: 98.9 F (37.2 C)  Resp:   POX 94% RA General: Well developed, well nourished AAM in no acute distress. Head: Normocephalic, atraumatic, sclera non-icteric, no xanthomas, nares are without discharge. Poor dentition Neck: Negative for carotid bruits. JVD mildly elevated. Lungs: diminished breath sounds at bases, faint rales 1/3 way up. No wheezes or rhonchi. Breathing is unlabored. Heart: Irregularly irregular with tachycardia, S1 S2 without murmurs, rubs, or gallops.  Abdomen: Soft, non-tender, non-distended with normoactive bowel sounds. No hepatomegaly. No rebound/guarding. No obvious abdominal masses. Msk:  Strength and tone appear normal for age. Extremities: No clubbing or cyanosis. Tr bilateral edema.  Occlusive bandage over cath site still in place. Distal pedal pulses are intact and equal bilaterally. Neuro: Alert and oriented X 3. Moves all extremities spontaneously. Psych:  Responds to questions appropriately with a normal affect.    Intake/Output Summary (Last 24 hours) at 09/10/11 0849 Last data filed at 09/10/11 0818  Gross per 24 hour  Intake   2286 ml  Output   2125 ml  Net    161 ml    Inpatient Medications:    . aspirin  324 mg Oral Once  . aspirin EC  325 mg Oral Daily  . clopidogrel  75 mg Oral Q breakfast  . colchicine  0.6 mg Oral  Daily  . fentaNYL      . heparin      . insulin aspart  0-15 Units Subcutaneous TID WC  . insulin aspart  0-5 Units Subcutaneous QHS  . lidocaine      . lisinopril  20 mg Oral Daily  . metoprolol  100 mg Oral BID  . midazolam      . nitroGLYCERIN      . DISCONTD: furosemide  40 mg Intravenous BID  . DISCONTD: sodium chloride  3 mL Intravenous Q12H    Labs:  [facility] 09/10/11 0540 09/09/11 0508  NA 138 139  K 4.0 3.7  CL 102 103  CO2 27 26  GLUCOSE 144* 171*  BUN 12 14  CREATININE 0.84 0.86  CALCIUM 9.2 8.8  MG -- --  PHOS -- --    Basename 09/10/11 0540 09/09/11 0508  AST 60* 134*  ALT 303* 424*  ALKPHOS 142* 153*  BILITOT 1.2 1.4*  PROT 6.3 6.1  ALBUMIN 3.2* 3.2*    Basename 09/09/11 0330 09/08/11 2117  WBC 9.9 9.7  NEUTROABS -- 5.1  HGB 13.4 14.0  HCT 40.6 42.9  MCV 91.9 92.9  PLT 175 186    Basename 09/09/11 1614 09/09/11 0949 09/09/11 0513 09/08/11 1839  CKTOTAL 170 186 211 --  CKMB 4.4* 4.9* 5.9* --  TROPONINI 0.98* 1.22* 1.47* 1.81*    Basename 09/09/11 0237  HGBA1C 6.9*    Basename 09/09/11 0508  CHOL 155  HDL 41  LDLCALC 99  TRIG 76  CHOLHDL 3.8   Radiology/Studies:  1.  Chest 2 View 09/08/2011  *RADIOLOGY REPORT*  Clinical Data: Chest pain on the left side.  History of hypertension, CAD, diabetes.  CHEST - 2 VIEW  Comparison: 01/21/2011  Findings: The heart is enlarged.  The lungs are free of focal consolidations and pleural effusions.  Mild perihilar bronchitic changes are present.  No edema.  Degenerative changes are seen in the spine.  IMPRESSION:  1.  Cardiomegaly without edema. 2.  Bronchitic changes.  Original Report Authenticated By: Patterson Hammersmith, M.D.   2. US Abdomen Complete 09/08/2011  *RADIOLOGY REPORT*  Clinical Data:  Elevated LFTs  COMPLETE ABDOMINAL ULTRASOUND  Comparison:  None.  Findings:  Gallbladder:  Mild gallbladder wall thickening at 4 mm.  No gallstones identified.  Negative sonographic Murphy's sign.  Common bile  duct:  Measures within normal limits at 45 mm.  Liver:  No focal lesion identified.  Within normal limits in parenchymal echogenicity.  IVC:  Appears normal.  Pancreas:  No focal abnormality identified within the head/neck. The body and tail are obscured by overlying bowel gas artifact.  Spleen:  Measures 10.1 cm.  No focal abnormality.  Right Kidney:  Measures 11.9 cm.  No hydronephrosis or focal abnormality.  Left Kidney:  Measures 12.0 cm.  No hydronephrosis or focal abnormality.  Abdominal aorta:  Measures 2.5 cm.  No aneurysmal dilatation identified.  Small right pleural effusion noted.  Small amount of intraperitoneal fluid noted inferior to the liver.  IMPRESSION:  Mild gallbladder wall thickening is nonspecific,can be seen in the setting of inflammation/infection, hypervolemia, or hepatitis.  No cholelithiasis or other sonographic evidence for cholecystitis.  Small amount of free intraperitoneal fluid.  Small right pleural effusion.  Original Report Authenticated By: Waneta Martins, M.D.   3. Study Conclusions  - Left ventricle: The cavity size was mildly dilated. Wall thickness was increased in a pattern of mild LVH. Systolic function was severely reduced. The estimated ejection fraction was in the range of 25% to 30%. Global hypokinesis but severe hypokinesis to akinesis of the basal to mid inferior and posterior walls. Doppler parameters are consistent with restrictive physiology, indicative of decreased left ventricular diastolic compliance and/or increased left atrial pressure. - Aortic valve: There was no stenosis. - Mitral valve: Mild regurgitation. - Left atrium: The atrium was moderately dilated. - Right ventricle: The cavity size was normal. Systolic function was moderately reduced. - Right atrium: The atrium was moderately dilated. - Tricuspid valve: Peak RV-RA gradient: 30mm Hg (S). - Pulmonary arteries: PA peak pressure: 50mm Hg (S). - Systemic veins: IVC measured 3.0 cm  with minimal respirophasic variation, suggesting RA pressure 20 mmHg. Impressions:  - The patient was in atrial fibrillation. Mildly dilated LV with mild LV hypertrophy. EF 25-30%. There is global hypokinesis but it appears regionally worse in the posterior and inferior walls (basal to mid inferior and posterior). Severe diastolic dysfunction. Normal RV size with moderately decreased systolic function. Mild to moderate pulmonary hypertension.    Assessment and Plan  56 y/o M with hx HTN, HL, DM, prior EF 40-45%, EtOH use presented to [facility] with complaints of fatigue, CP, SOB found to be in AF RVR with NSTEMI. Cath yesterday demonstrated distal Cx dz, otherwise nonobstructive dz. Echo showed EF 30-35%, moderately decreased RV systoilc function, mild-mod pulm HTN.  1. NSTEMI with distal vessel CAD primarily in Cx dz with nonobstructive LAD, RCA stenosis  in setting of AF RVR: for med rx. Decrease ASA to 81mg  daily given concurrent Plavix use. Continue Plavix for now for NSTEMI. Continue BB. Consult CM for possible help with meds/HH. Not on statin due to transaminitis.  2. Atrial fib with RVR: newly documented. CHADS2 = 3 but per previous notes, questionable compliance for anticoagulation which I also foresee. MD please advise on final decision. Still on IV heparin while this is made. On IV diltiazem gtt but would consider tapering this off in lieu a different agent such as digoxin for rate control, with uptitration of BB.  3. Acute on chronic CHF likely systolic and diastolic: EF 25-30% also with restrictive physiology. Cont ACEI, BB. Add daily weights/I&Os. Has not been diuresed but would consider giving a dose of IV Lasix. Does have some rales and JVD is up a little. Note RV systolic dysfunction and mild-mod pulm HTN. ?r/o PE contributing to #1/2.  4. Transaminitis - ? R/t #1 or #3 in setting of EtOH use. Hep panel negative. LFTs trending down, abd [place] with mild nonspecific GB thickening without  cholecystitis, small amt of intraperitoneal fluid. Continue to hold statin & trend. PT elevated at baseline on admission, will recheck with next labs.  5. DM - on SSI.  6. EtOH abuse - initiate CIWA protocol as it may help [place] with his HR/BP which is creeping up.  DVT ppx: has SCD's ordered but not currently in place as he is on IV heparin. Will need to make decision regarding heparin.   Signed, Ronie Spies PA-C  Long discussion with patient today.  He has a pretty good grasp of concepts, and is listening fairly well to what I discussed.  He understands that ETOH is an issue, and also understands that his drinking is a problem for appopriate anticoagulation.  His rate is not well controlled and we will need to make some adjustments in meds to stop his dilt IV.  We will go ahead and consider warfarin, with only monthly prescriptions and regular visits back to the clinic at [facility] for regular monitoring.  This will keep him from a major complication.  Will also check transaminases again tomorrow.

## 2011-09-11 DIAGNOSIS — I4891 Unspecified atrial fibrillation: Secondary | ICD-10-CM

## 2011-09-11 LAB — COMPREHENSIVE METABOLIC PANEL
ALT: 223 U/L — ABNORMAL HIGH (ref 0–53)
CO2: 28 mEq/L (ref 19–32)
Calcium: 9.6 mg/dL (ref 8.4–10.5)
Creatinine, Ser: 0.85 mg/dL (ref 0.50–1.35)
GFR calc Af Amer: >90 mL/min (ref >90)
GFR calc non Af Amer: >90 mL/min (ref >90)
Glucose, Bld: 146 mg/dL — ABNORMAL HIGH (ref 70–99)
Sodium: 139 mEq/L (ref 135–145)
Total Protein: 6.6 g/dL (ref 6.0–8.3)

## 2011-09-11 LAB — CBC
HCT: 41.6 % (ref 39.0–52.0)
Hemoglobin: 13.3 g/dL (ref 13.0–17.0)
RBC: 4.43 MIL/uL (ref 4.22–5.81)
WBC: 8 10*3/uL (ref 4.0–10.5)

## 2011-09-11 LAB — PROTIME-INR
INR: 1.16 (ref 0.00–1.49)
Prothrombin Time: 15 seconds (ref 11.6–15.2)

## 2011-09-11 LAB — GLUCOSE, CAPILLARY
Glucose-Capillary: 133 mg/dL — ABNORMAL HIGH (ref 70–99)
Glucose-Capillary: 108 mg/dL — ABNORMAL HIGH (ref 70–99)
Glucose-Capillary: 171 mg/dL — ABNORMAL HIGH (ref 70–99)

## 2011-09-11 LAB — MAGNESIUM: Magnesium: 1.7 mg/dL (ref 1.5–2.5)

## 2011-09-11 MED ORDER — SILVER NITRATE-POT NITRATE 75-25 % EX MISC
1.0000 "application " | Freq: Once | CUTANEOUS | Status: DC
  Filled 2011-09-11: qty 1

## 2011-09-11 MED ORDER — MAGNESIUM OXIDE 400 (241.3 MG) MG PO TABS
400.0000 mg | ORAL_TABLET | Freq: Two times a day (BID) | ORAL | Status: DC
  Administered 2011-09-11 (×2): 400 mg via ORAL
  Filled 2011-09-11 (×4): qty 1

## 2011-09-11 MED ORDER — BACITRACIN ZINC 500 UNIT/GM EX OINT
TOPICAL_OINTMENT | Freq: Three times a day (TID) | CUTANEOUS | Status: DC
  Administered 2011-09-11 (×2): via TOPICAL
  Administered 2011-09-12: 1 via TOPICAL
  Administered 2011-09-12: 21:00:00 via TOPICAL
  Administered 2011-09-12: 1 via TOPICAL
  Administered 2011-09-13 – 2011-09-15 (×8): via TOPICAL
  Administered 2011-09-15: 1 via TOPICAL
  Administered 2011-09-16: 11:00:00 via TOPICAL
  Filled 2011-09-11: qty 15

## 2011-09-11 MED ORDER — BACITRACIN ZINC 500 UNIT/GM EX OINT
TOPICAL_OINTMENT | CUTANEOUS | Status: DC | PRN
  Filled 2011-09-11: qty 15

## 2011-09-11 MED ORDER — OXYMETAZOLINE HCL 0.05 % NA SOLN
2.0000 | Freq: Two times a day (BID) | NASAL | Status: AC
  Administered 2011-09-11 – 2011-09-15 (×8): 2 via NASAL
  Filled 2011-09-11: qty 15

## 2011-09-11 MED ORDER — DILTIAZEM HCL ER COATED BEADS 180 MG PO CP24
180.0000 mg | ORAL_CAPSULE | Freq: Every day | ORAL | Status: DC
  Administered 2011-09-11 – 2011-09-14 (×4): 180 mg via ORAL
  Filled 2011-09-11 (×5): qty 1

## 2011-09-11 MED ORDER — FUROSEMIDE 10 MG/ML IJ SOLN
40.0000 mg | Freq: Once | INTRAMUSCULAR | Status: AC
  Administered 2011-09-11: 40 mg via INTRAVENOUS
  Filled 2011-09-11: qty 4

## 2011-09-11 MED ORDER — WARFARIN SODIUM 7.5 MG PO TABS
7.5000 mg | ORAL_TABLET | Freq: Once | ORAL | Status: DC
  Filled 2011-09-11: qty 1

## 2011-09-11 MED ORDER — MAGNESIUM OXIDE 400 (241.3 MG) MG PO TABS
400.0000 mg | ORAL_TABLET | Freq: Every day | ORAL | Status: DC
  Administered 2011-09-12 – 2011-09-16 (×5): 400 mg via ORAL
  Filled 2011-09-11 (×5): qty 1

## 2011-09-11 MED ORDER — WARFARIN SODIUM 5 MG PO TABS
5.0000 mg | ORAL_TABLET | Freq: Once | ORAL | Status: AC
  Administered 2011-09-11: 5 mg via ORAL
  Filled 2011-09-11: qty 1

## 2011-09-11 MED ORDER — ENOXAPARIN SODIUM 40 MG/0.4ML ~~LOC~~ SOLN
40.0000 mg | SUBCUTANEOUS | Status: DC
  Administered 2011-09-11 – 2011-09-12 (×2): 40 mg via SUBCUTANEOUS
  Filled 2011-09-11 (×2): qty 0.4

## 2011-09-11 MED ORDER — DIGOXIN 250 MCG PO TABS
0.2500 mg | ORAL_TABLET | Freq: Every day | ORAL | Status: DC
  Administered 2011-09-11 – 2011-09-14 (×4): 0.25 mg via ORAL
  Filled 2011-09-11 (×5): qty 1

## 2011-09-11 NOTE — Progress Notes (Signed)
CARDIAC REHAB PHASE I   PRE:  Rate/Rhythm: 89afib  BP:  Supine: 159/93  Sitting:   Standing:    SaO2:   MODE:  Ambulation: 350 ft   POST:  Rate/Rhythem: 105afib  BP:  Supine:   Sitting: 178/90  Standing:    SaO2: 98%RA 1332-1405 Waited for magnesium level back before walking. Pt walked 350 ft independently with steady gait. Did not want to to farther as he had lasix and needed to stay close to room. Denied CP.  Reviewed NTG use.  Duanne Limerick

## 2011-09-11 NOTE — Progress Notes (Signed)
Rhonda Barrett PAc notified of arrhythmia on EKG rhythm strip. Orders received. P.Juanna Pudlo RN

## 2011-09-11 NOTE — Progress Notes (Signed)
Pt had very small nose bleed. No interventions needed at this time. Will monitor.

## 2011-09-11 NOTE — Consult Note (Signed)
     Timothy Olsen, Timothy Olsen [id number] 03/03/1955 Marinus Maw, MD Reason for Consult: Epistaxis left greater than right HPI: 56 yo admitted for Afib. On anticoagulation, has had some left greater than right epistaxis, self-limited while inpatient. ENT consulted for left epistaxis.   Allergies: No Known Allergies  ROS: Epistaxis, Review of systems otherwise normal x 10 systems except per HPI.  PMH:  Past Medical History  Diagnosis Date  . Hypertension   . Diabetes mellitus   . Coronary artery disease   . Irregular heartbeat     FH:  Family History  Problem Relation Age of Onset  . Diabetes type II Mother   . Hypertension Father     SH:  History   Social History  . Marital Status: Single    Spouse Name: N/A    Number of Children: N/A  . Years of Education: N/A   Occupational History  . Not on file.   Social History Main Topics  . Smoking status: Former Smoker -- 0.5 packs/day for 2 years    Types: Cigarettes  . Smokeless tobacco: Never Used  . Alcohol Use: 3.0 oz/week    6 drink(s) per week     rarely  . Drug Use: No  . Sexually Active: No   Other Topics Concern  . Not on file   Social History Narrative  . No narrative on file    PSH:  Past Surgical History  Procedure Date  . Tonsillectomy   . Left thumb surgery   . Adenoidectomy     Physical  Exam: CN 2-12 grossly intact and symmetric. EAC/TMs normal BL. Oral cavity, lips, gums, ororpharynx normal with no masses or lesions. Skin warm and dry. Nasal cavity without polyps or purulence but he does have prominent anterior septal vessels left greater than right. External nose and ears without masses or lesions. EOMI, PERRLA. Neck supple with no masses or lesions. No lymphadenopathy palpated. Thyroid normal with no masses.  Procedure Note:  30901-Left-left control of nasal hemorrhage, simple: Informed verbal consent was obtained after explaining the risks (including bleeding and infection), benefits and  alternatives of the procedure. Verbal timeout was performed prior to the procedure. The nose was examined with a headlight and speculum. The septum showed bilateral anterior septal excoriation with vessels more prominent on the left. The left septum was thoroughly cauterized with silver nitrate. A single excoriated vessel on the right anterior septum was gently and sparingly cauterized with the silver nitrate. The patient tolerated the procedure with no immediate complications.   A/P: recurrent left greater than right epistaxis s/p cautery with silver nitrate. Will add TID Bacitracin to the nose to prevent nasal dryness and BID Afrin spray for 4 days. Follow up as needed.   Melvenia Beam 09/11/2011 4:42 PM

## 2011-09-11 NOTE — Clinical Social Work Psychosocial (Signed)
     Clinical Social Work Department BRIEF PSYCHOSOCIAL ASSESSMENT 09/11/2011  Patient:  Timothy Olsen, Timothy Olsen     Account Number:  [id number]     Admit date:  09/08/2011  Clinical Social Worker:  Hulan Fray  Date/Time:  09/11/2011 02:50 PM  Referred by:  Care Management  Date Referred:  09/10/2011 Referred for  Substance Abuse   Other Referral:   Interview type:  Patient Other interview type:    PSYCHOSOCIAL DATA Living Status:  ALONE Admitted from facility:   Level of care:   Primary support name:  Margaretha Glassing Primary support relationship to patient:  SIBLING Degree of support available:   adequate    CURRENT CONCERNS Current Concerns  None Noted   Other Concerns:    SOCIAL WORK ASSESSMENT / PLAN Clinical Social Worker received referral for substance abuse for alcohol. CSW reviewed chart. CSW introduced self and explained reason for visit. Patient was agreeable to have CSW compete SBIRT with him. Patient scored a 3, which means low risk on form. Patient reported that he only drinks on the weekends. Patient reported that he did not have a drinking problem. Patient repeatedly stated that the questions that were asked from the SBIRT were "very offensive" and "invasion of privacy." CSW offered resources for substance abuse treatment and he declined. Patient reported that he does not drink enough to have to go to "one of those classes." CSW will sign off, as social work intervention is no longer needed.   Assessment/plan status:  No Further Intervention Required Other assessment/ plan:   Information/referral to community resources:   Patient declined resources for substance abuse treatment    PATIENTS/FAMILYS RESPONSE TO PLAN OF CARE: Patient was not agreeable to resources for substance abuse, but was agreeable to answer the questions on the SBIRT tool.

## 2011-09-11 NOTE — Progress Notes (Signed)
Subjective:  Patient frustrated.  He had a nose bleed and stopped his heparin.  He does not feel he needs much blood thinner.  He also is frustrated over the various BP meds that have been used in the past.    Objective:  Vital Signs in the last 24 hours: Temp:  [97.8 F (36.6 C)-98.9 F (37.2 C)] 98.5 F (36.9 C) (08/01 0805) Pulse Rate:  [86-108] 108  (08/01 0805) Resp:  [20] 20  (08/01 0400) BP: (133-182)/(48-98) 165/91 mmHg (08/01 0805) SpO2:  [93 %-96 %] 93 % (08/01 0805)  Intake/Output from previous day: 07/31 0701 - 08/01 0700 In: 1480.2 [P.O.:840; I.V.:640.2] Out: 3825 [Urine:3825]   Physical Exam: General: Well developed, well nourished, in no acute distress. Head:  Normocephalic and atraumatic. Nose:  No obvious current bleeding.   Lungs: basilar crackles.   Heart: Normal S1 and S2.  No murmur, rubs or gallops.  Pulses: Pulses normal in all 4 extremities. Extremities: No clubbing or cyanosis. No edema. Neurologic: Alert and oriented x 3.    Lab Results:  Basename 09/11/11 0527 09/10/11 0830  WBC 8.0 8.0  HGB 13.3 13.4  PLT 173 171    Basename 09/11/11 0527 09/10/11 0540  NA 139 138  K 4.2 4.0  CL 103 102  CO2 28 27  GLUCOSE 146* 144*  BUN 10 12  CREATININE 0.85 0.84    Basename 09/09/11 1614 09/09/11 0949  TROPONINI 0.98* 1.22*   Hepatic Function Panel  Basename 09/11/11 0527  PROT 6.6  ALBUMIN 3.2*  AST 34  ALT 223*  ALKPHOS 134*  BILITOT 0.6  BILIDIR --  IBILI --    Basename 09/09/11 0508  CHOL 155   No results found for this basename: PROTIME in the last 72 hours  Imaging: No results found.    Assessment/Plan:  Atrial fib     Still poorly controlled.     Adding dig and some dilt  (would like to avoid but difficult) at lower dose.     Will get ENT to see regarding nose bleeds.  CAD     See cath report.  Continue medical therapy Elevated LFTs     Improving  -- check again tomorrow.  ?rel to ETOH. LV dysfunction     Post  MI with crackles.  Lasix today.         Shawnie Pons, MD, [facility], FSCAI 09/11/2011, 8:57 AM

## 2011-09-11 NOTE — Progress Notes (Signed)
ANTICOAGULATION CONSULT NOTE - Follow Up  Pharmacy Consult for warfarin Indication:  Afib  Vital Signs: Temp: 98.5 F (36.9 C) (08/01 0805) Temp src: Oral (08/01 0805) BP: 165/91 mmHg (08/01 0805) Pulse Rate: 108  (08/01 0805)  Labs:  Timothy Olsen 09/11/11 0527 09/10/11 0830 09/10/11 0540 09/09/11 1614 09/09/11 0949 09/09/11 0513 09/09/11 0508 09/09/11 0330 09/09/11 0214 09/08/11 2116  HGB 13.3 13.4 -- -- -- -- -- -- -- --  HCT 41.6 42.2 -- -- -- -- -- 40.6 -- --  PLT 173 171 -- -- -- -- -- 175 -- --  APTT -- -- -- -- -- -- -- -- -- 79*  LABPROT 15.0 16.8* -- -- -- -- -- -- -- 17.8*  INR 1.16 1.34 -- -- -- -- -- -- -- 1.44  HEPARINUNFRC -- 0.41 -- -- 0.12* -- -- -- <0.10* --  CREATININE 0.85 -- 0.84 -- -- -- 0.86 -- -- --  CKTOTAL -- -- -- 170 186 211 -- -- -- --  CKMB -- -- -- 4.4* 4.9* 5.9* -- -- -- --  TROPONINI -- -- -- 0.98* 1.22* 1.47* -- -- -- --    Assessment: 56 yo male who presented to the ED with CP/afib now s/p cath off heparin due to recurrent nose bleeds and patient becoming frustrated with blood thinners on board. Patient noted with CHADS2= 3 but concerns for compliance and alcohol use.    INR down this morning to 1.1, first dose of warfarin given last night. Still with nose bleed this morning, ENT to evaluate.  Goal of Therapy:  INR goal 2-3   Plan:  Warfarin 7.5mg  today INR daily  Sheppard Coil, Pharm D 09/11/2011 9:11 AM

## 2011-09-12 DIAGNOSIS — I255 Ischemic cardiomyopathy: Secondary | ICD-10-CM | POA: Diagnosis present

## 2011-09-12 DIAGNOSIS — I214 Non-ST elevation (NSTEMI) myocardial infarction: Secondary | ICD-10-CM

## 2011-09-12 DIAGNOSIS — I4891 Unspecified atrial fibrillation: Secondary | ICD-10-CM | POA: Diagnosis present

## 2011-09-12 LAB — COMPREHENSIVE METABOLIC PANEL
Alkaline Phosphatase: 131 U/L — ABNORMAL HIGH (ref 39–117)
BUN: 11 mg/dL (ref 6–23)
Creatinine, Ser: 0.85 mg/dL (ref 0.50–1.35)
GFR calc Af Amer: >90 mL/min (ref >90)
Glucose, Bld: 177 mg/dL — ABNORMAL HIGH (ref 70–99)
Potassium: 4.1 mEq/L (ref 3.5–5.1)
Total Bilirubin: 0.8 mg/dL (ref 0.3–1.2)
Total Protein: 6.6 g/dL (ref 6.0–8.3)

## 2011-09-12 LAB — CBC
MCH: 30.3 pg (ref 26.0–34.0)
MCHC: 32.8 g/dL (ref 30.0–36.0)
Platelets: 174 10*3/uL (ref 150–400)
RBC: 4.85 MIL/uL (ref 4.22–5.81)

## 2011-09-12 LAB — PROTIME-INR
INR: 1.19 (ref 0.00–1.49)
Prothrombin Time: 15.4 seconds — ABNORMAL HIGH (ref 11.6–15.2)

## 2011-09-12 MED ORDER — FUROSEMIDE 20 MG PO TABS
20.0000 mg | ORAL_TABLET | Freq: Every day | ORAL | Status: DC
  Administered 2011-09-12 – 2011-09-16 (×5): 20 mg via ORAL
  Filled 2011-09-12 (×5): qty 1

## 2011-09-12 MED ORDER — RIVAROXABAN 20 MG PO TABS
20.0000 mg | ORAL_TABLET | Freq: Every day | ORAL | Status: DC
  Administered 2011-09-13 – 2011-09-16 (×4): 20 mg via ORAL
  Filled 2011-09-12 (×5): qty 1

## 2011-09-12 MED ORDER — METOPROLOL TARTRATE 50 MG PO TABS
150.0000 mg | ORAL_TABLET | Freq: Two times a day (BID) | ORAL | Status: DC
  Administered 2011-09-12 – 2011-09-16 (×9): 150 mg via ORAL
  Filled 2011-09-12 (×10): qty 1

## 2011-09-12 MED ORDER — WARFARIN SODIUM 5 MG PO TABS
5.0000 mg | ORAL_TABLET | Freq: Once | ORAL | Status: AC
  Administered 2011-09-12: 5 mg via ORAL
  Filled 2011-09-12: qty 1

## 2011-09-12 NOTE — Progress Notes (Signed)
Pt transferred to 2021 from 2912.

## 2011-09-12 NOTE — Progress Notes (Signed)
Patient Name: Timothy Olsen Date of Encounter: 09/12/2011   SUBJECTIVE: Pt denies CP/SOB. Feels he is doing OK. Eating well.  OBJECTIVE Filed Vitals:   09/12/11 0000 09/12/11 0400 09/12/11 0500 09/12/11 0749  BP: 148/82 149/93  171/88  Pulse:    102  Temp: 99.6 F (37.6 C) 98.9 F (37.2 C)    TempSrc: Oral Oral  Oral  Resp: 16 18  16   Height:      Weight:   214 lb 4.6 oz (97.2 kg)   SpO2: 93% 95%  92%    Intake/Output Summary (Last 24 hours) at 09/12/11 0917 Last data filed at 09/12/11 0900  Gross per 24 hour  Intake    865 ml  Output   3476 ml  Net  -2611 ml   Weight change:  Filed Weights   09/09/11 0344 09/12/11 0500  Weight: 222 lb 3.6 oz (100.8 kg) 214 lb 4.6 oz (97.2 kg)   PHYSICAL EXAM General: Well developed, well nourished, male in no acute distress. Head: Normocephalic, atraumatic.  Neck: Supple without bruits, JVD minimal elevation. Lungs:  Resp regular and unlabored, rales bilat with few crackles bases R>L. Heart: irreg, S1, S2, no S3, S4, or murmur. Abdomen: Soft, non-tender, non-distended, BS + x 4.  Extremities: No clubbing, cyanosis, no edema.  Neuro: Alert and oriented X 3. Moves all extremities spontaneously. Psych: Normal affect.  LABS: CBC: Basename 09/12/11 0608 09/11/11 0527  WBC 7.1 8.0  NEUTROABS -- --  HGB 14.7 13.3  HCT 44.8 41.6  MCV 92.4 93.9  PLT 174 173   INR: Basename 09/12/11 0608  INR 1.19   Basic Metabolic Panel: Basename 09/12/11 0608 09/11/11 0527  NA 139 139  K 4.1 4.2  CL 101 103  CO2 27 28  GLUCOSE 177* 146*  BUN 11 10  CREATININE 0.85 0.85  CALCIUM 10.0 9.6  MG -- 1.7  PHOS -- --   Liver Function Tests: [facility] [place] 09/12/11 0608 09/11/11 0527  AST 26 34  ALT 156* 223*  ALKPHOS 131* 134*  BILITOT 0.8 0.6  PROT 6.6 6.6  ALBUMIN 3.1* 3.2*   Cardiac Enzymes: Basename 09/09/11 1614 09/09/11 0949  CKTOTAL 170 186  CKMB 4.4* 4.9*  CKMBINDEX -- --  TROPONINI 0.98* 1.22*   BNP: Pro B Natriuretic  peptide (BNP)  Date/Time Value Range Status  09/09/2011  5:13 AM 1005.0* 0 - 125 pg/mL Final    TELE: Atrial fib with RVR, 6 bts NSVT     Radiology/Studies: Dg Chest 2 View 09/08/2011  *RADIOLOGY REPORT*  Clinical Data: Chest pain on the left side.  History of hypertension, CAD, diabetes.  CHEST - 2 VIEW  Comparison: 01/21/2011  Findings: The heart is enlarged.  The lungs are free of focal consolidations and pleural effusions.  Mild perihilar bronchitic changes are present.  No edema.  Degenerative changes are seen in the spine.  IMPRESSION:  1.  Cardiomegaly without edema. 2.  Bronchitic changes.  Original Report Authenticated By: Patterson Hammersmith, M.D.   US Abdomen Complete 09/08/2011  *RADIOLOGY REPORT*  Clinical Data:  Elevated LFTs  COMPLETE ABDOMINAL ULTRASOUND  Comparison:  None.  Findings:  Gallbladder:  Mild gallbladder wall thickening at 4 mm.  No gallstones identified.  Negative sonographic Murphy's sign.  Common bile duct:  Measures within normal limits at 45 mm.  Liver:  No focal lesion identified.  Within normal limits in parenchymal echogenicity.  IVC:  Appears normal.  Pancreas:  No focal abnormality identified within  the head/neck. The body and tail are obscured by overlying bowel gas artifact.  Spleen:  Measures 10.1 cm.  No focal abnormality.  Right Kidney:  Measures 11.9 cm.  No hydronephrosis or focal abnormality.  Left Kidney:  Measures 12.0 cm.  No hydronephrosis or focal abnormality.  Abdominal aorta:  Measures 2.5 cm.  No aneurysmal dilatation identified.  Small right pleural effusion noted.  Small amount of intraperitoneal fluid noted inferior to the liver.  IMPRESSION:  Mild gallbladder wall thickening is nonspecific,can be seen in the setting of inflammation/infection, hypervolemia, or hepatitis.  No cholelithiasis or other sonographic evidence for cholecystitis.  Small amount of free intraperitoneal fluid.  Small right pleural effusion.  Original Report Authenticated By:  Waneta Martins, M.D.    Current Medications:  . aspirin EC  81 mg Oral Daily  . bacitracin   Topical TID  . colchicine  0.6 mg Oral Daily  . digoxin  0.25 mg Oral Daily  . diltiazem  180 mg Oral Daily  . enoxaparin (LOVENOX) injection  40 mg Subcutaneous Q24H  . folic acid  1 mg Oral Daily  . furosemide  40 mg Intravenous Once  . insulin aspart  0-15 Units Subcutaneous TID WC  . insulin aspart  0-5 Units Subcutaneous QHS  . lisinopril  20 mg Oral Daily  . LORazepam  0-4 mg Oral Q6H   Followed by  . LORazepam  0-4 mg Oral Q12H  . magnesium oxide  400 mg Oral Daily  . metoprolol  100 mg Oral BID  . multivitamin with minerals  1 tablet Oral Daily  . oxymetazoline  2 spray Each Nare BID  . thiamine  100 mg Oral Daily   Or  . thiamine  100 mg Intravenous Daily  . warfarin  5 mg Oral ONCE-1800  . warfarin  5 mg Oral ONCE-1800  . Warfarin - Pharmacist Dosing Inpatient   Does not apply q1800   ASSESSMENT AND PLAN: Active Problems:  Atrial fibrillation with rapid ventricular response - still with elevated HR despite Lopressor 100 BID and cardizem 180 QD. Will increase BB to 150 BID and follow.   Non-ST elevation myocardial infarction (NSTEMI), initial episode of care - medical therapy with ASA/BB. No statin secondary to elevated LFTs.   Ischemic cardiomyopathy - on ACE/BB; recheck in 3 months and MD advise on considering ICD if no improvement. Will add Lasix 20 mg daily and follow volume status.   Diabetes mellitus - restart glipizide today, restart metformin in am, cont SSI   Hypertension - think will tolerate increased BB and diuretic   Hyperlipidemia - PTA pravastatin 40 mg, MD advise on restarting or wait and recheck LFTs as OP.   Warfarin anticoagulation-  Loading with coum, follow. Limit or d/c ETOH  NSVT - increase BB and follow, no Sx  Plan - tx telem and follow. MD advise on d/c planning.  Signed, Theodore Demark , PA-C 9:17 AM 09/12/2011  Patient seen and  examined.  His rate is better controlled, but on increased meds.  I appreciate the input of EP at present.  Current plan is for TEE guided cardioversion on Monday.  Now on Xarelto but will need to watch for nose bleeds.

## 2011-09-12 NOTE — Consult Note (Addendum)
ELECTROPHYSIOLOGY CONSULT NOTE  Patient ID: Timothy Olsen MRN: [id number], DOB/AGE: [birth year]   Admit date: 09/08/2011 Date of Consult: 09/12/2011  Primary Physician: Lavada Mesi, MD Primary Cardiologist: Elease Hashimoto, MD Reason for Consultation: Atrial fibrillation  History of Present Illness Timothy Olsen is a 56 year old man with HTN, dyslipidemia, DM, prior LVEF 40-45% and EtOH abuse who presented to [facility] with complaints of fatigue, CP and SOB, found to be in AF w/RVR with NSTEMI. Cardiac cath 09/09/2011 demonstrated distal Cx disease but otherwise nonobstructive disease. Echo showed EF 30-35%, moderately decreased RV systoilc function and mild-mod pulm HTN. His AF has been difficult to rate control; therefore, we have been asked to see him in consultation for EP recommendations. Timothy Olsen reports his SOB is improving and he currently denies CP. He states he is not aware of his "irregular heartbeat" and denies palpitations. He denies dizziness, near syncope or syncope. He has LE swelling but denies orthopnea or PND. He denies any history of thyroid dysfunction. He drinks alcohol regularly. He reports being diagnosed with "an irregular heartbeat" years ago but cannot recall the specific diagnosis. He also states he has been told for several years that his heart is weak. Of note, he reports medical noncompliance with medications and follow-up.  Past Medical History  Diagnosis Date  . Hypertension   . Diabetes mellitus   . Coronary artery disease   . Dyslipidemia   . Atrial fibrillation   . Severe diastolic dysfunction   . Cardiomyopathy, EF 30-35%     Past Surgical History  Procedure Date  . Tonsillectomy   . Left thumb surgery   . Adenoidectomy     Allergies/Intolerances No Known Allergies  Inpatient Medications . aspirin EC  81 mg Oral Daily  . bacitracin   Topical TID  . colchicine  0.6 mg Oral Daily  . digoxin  0.25 mg Oral Daily  . diltiazem  180 mg Oral Daily  .  enoxaparin (LOVENOX) injection  40 mg Subcutaneous Q24H  . folic acid  1 mg Oral Daily  . furosemide  40 mg Intravenous Once  . insulin aspart  0-15 Units Subcutaneous TID WC  . insulin aspart  0-5 Units Subcutaneous QHS  . lisinopril  20 mg Oral Daily  . lorazepam  0-4 mg Oral Q6H  . magnesium oxide  400 mg Oral Daily  . metoprolol  100 mg Oral BID  . multivitamin with minerals  1 tablet Oral Daily  . oxymetazoline  2 spray Each Nare BID  . thiamine  100 mg Oral Daily   OR  . thiamine  100 mg Intravenous Daily  . warfarin  5 mg Oral ONCE-1800  . warfarin  5 mg Oral ONCE-1800   . diltiazem (CARDIZEM) infusion 15 mg/hr (09/11/11 4098)   Family History Problem Relation Age of Onset  . Diabetes type II Mother   . Hypertension Father     Social History Social History  . Marital Status: Single   Social History Main Topics  . Smoking status: Former Smoker -- 0.5 packs/day for 2 years    Types: Cigarettes  . Smokeless tobacco: Never Used  . Alcohol Use: 3.0 oz/week    6 drink(s) per week     rarely  . Drug Use: No   Review of Systems General: No chills, fever, night sweats or weight changes  Cardiovascular: No chest pain, dyspnea on exertion, edema, orthopnea, palpitations, paroxysmal nocturnal dyspnea Dermatological: No rash, lesions or masses Respiratory: No cough, dyspnea  Urologic: No hematuria, dysuria Abdominal: No nausea, vomiting, diarrhea, bright red blood per rectum, melena, or hematemesis Neurologic: No visual changes, weakness, changes in mental status All other systems reviewed and are otherwise negative except as noted above.  Physical Exam Blood pressure 171/88, pulse 102, temperature 98.9 F (37.2 C), temperature source Oral, resp. rate 16, height 6\' 4"  (1.93 m), weight 214 lb 4.6 oz (97.2 kg), SpO2 92.00%.  General: Well developed, well appearing 56 year old male in no acute distress. HEENT: Normocephalic, atraumatic. EOMs intact. Sclera nonicteric.  Oropharynx clear.  Neck: Supple. +JVD. Lungs: Respirations regular and unlabored, CTA bilaterally. No wheezes, rales or rhonchi. Heart: Irregularly irregular. S1, S2 present. No murmurs, rub, S3 or S4. Abdomen: Soft, non-tender, non-distended. BS present x 4 quadrants. No hepatosplenomegaly.  Extremities: No clubbing, cyanosis or edema. DP/PT/Radials 2+ and equal bilaterally. Psych: Normal affect. Neuro: Alert and oriented X 3. Moves all extremities spontaneously.   Labs  [facility] 09/09/11 1614 09/09/11 0949  CKTOTAL 170 186  CKMB 4.4* 4.9*  TROPONINI 0.98* 1.22*   Lab Results  Component Value Date   WBC 7.1 09/12/2011   HGB 14.7 09/12/2011   HCT 44.8 09/12/2011   MCV 92.4 09/12/2011   PLT 174 09/12/2011    Lab 09/12/11 0608  NA 139  K 4.1  CL 101  CO2 27  BUN 11  CREATININE 0.85  CALCIUM 10.0  PROT 6.6  BILITOT 0.8  ALKPHOS 131*  ALT 156*  AST 26  GLUCOSE 177*    Basename 09/12/11 0608  INR 1.19    Radiology/Studies Echocardiogram  - Left ventricle: The cavity size was mildly dilated. Wall thickness was increased in a pattern of mild LVH. Systolic function was severely reduced. The estimated ejection fraction was in the range of 25% to 30%. Global hypokinesis but severe hypokinesis to akinesis of the basal to mid inferior and posterior walls. Doppler parameters are consistent with restrictive physiology, indicative of decreased left ventricular diastolic compliance and/or increased left atrial pressure. - Aortic valve: There was no stenosis. - Mitral valve: Mild regurgitation. - Left atrium: The atrium was moderately dilated. - Right ventricle: The cavity size was normal. Systolic function was moderately reduced. - Right atrium: The atrium was moderately dilated. - Tricuspid valve: Peak RV-RA gradient: 30mm Hg (S). - Pulmonary arteries: PA peak pressure: 50mm Hg (S). - Systemic veins: IVC measured 3.0 cm with minimal respirophasic variation, suggesting RA pressure 20  mmHg.  Impression: The patient was in atrial fibrillation. Mildly dilated LV with mild LV hypertrophy. EF 25-30%. There is global hypokinesis but it appears regionally worse in the posterior and inferior walls (basal to mid inferior and posterior). Severe diastolic dysfunction. Normal RV size with moderately decreased systolic function. Mild to moderate pulmonary hypertension.   Telemetry persistent AF with rates in the 120s currently  Assessment and Plan 1. Atrial fibrillation 2. LV dysfunction 3. CAD with recent NSTEMI 4. Elevated transaminases  Dr. Lewayne Bunting to see and make further recommendations. Signed, Rick Duff, PA-C 09/12/2011, 9:25 AM  EP Attending  Patient seen and examined. I agree with the history, physical exam, and assessment. He has had uncontrolled atrial fib despite maximal rate controlling meds. At this point we need to make a change. I would recommend stopping coumadin, starting Xarelto, and planning TEE DCCV early next week. He may also require anti-arrhythmic drug therapy to prevent recurrent atrial fib. But would hold off on this initially out of concerns with compliance issues. Hold digoxin prior to  TEE DCCV.  Lewayne Bunting, M.D.

## 2011-09-12 NOTE — Progress Notes (Signed)
ANTICOAGULATION CONSULT NOTE - Follow Up  Pharmacy Consult for warfarin Indication:  Afib  Vital Signs: Temp: 98.9 F (37.2 C) (08/02 0400) Temp src: Oral (08/02 0400) BP: 149/93 mmHg (08/02 0400)  Labs:  Basename 09/12/11 4098 09/11/11 0527 09/10/11 0830 09/10/11 0540 09/09/11 1614 09/09/11 0949  HGB 14.7 13.3 -- -- -- --  HCT 44.8 41.6 42.2 -- -- --  PLT 174 173 171 -- -- --  APTT -- -- -- -- -- --  LABPROT 15.4* 15.0 16.8* -- -- --  INR 1.19 1.16 1.34 -- -- --  HEPARINUNFRC -- -- 0.41 -- -- 0.12*  CREATININE 0.85 0.85 -- 0.84 -- --  CKTOTAL -- -- -- -- 170 186  CKMB -- -- -- -- 4.4* 4.9*  TROPONINI -- -- -- -- 0.98* 1.22*    Assessment: 56 yo male who presented to the ED with CP/afib now s/p cath off heparin due to recurrent nose bleeds and patient becoming frustrated with blood thinners on board. Patient noted with CHADS2= 3 but concerns for compliance and alcohol use.    INR 1.19 on day 3 of coumadin (pateint has received 2 doses of 5mg . Noted with nose bleed on 7/31 and 8/1 and ENT saw on 8/1 now s/p cautery with silver nitrate.  Goal of Therapy:  INR goal 2-3   Plan:  -Warfarin 5mg  today (will increase on 8/2 if no INR rise) -INR daily  Harland German, Pharm D 09/12/2011 8:33 AM

## 2011-09-12 NOTE — Progress Notes (Signed)
CARDIAC REHAB PHASE I   PRE:  Rate/Rhythm: 118 afib with PVC    BP: sitting 171/94    SaO2:   MODE:  Ambulation: 270 ft   POST:  Rate/Rhythm: 120s mostly with burst to 144 afib, very irregular    BP: sitting 179/92     SaO2:   C/o fatigue today. Slow walk, decrease distance. Did not push due to pts tendency to get irritated. Return to bed, refused chair. HR and BP elevated.  [id number]  Harriet Masson CES, ACSM

## 2011-09-12 NOTE — Progress Notes (Signed)
Pt received coumadin dose today. He  had xarelto scheduled also, informed Dr. Terressa Koyanagi, he asked to hold xarelto dose tonight and restart tomorrow as scheduled.

## 2011-09-13 LAB — BASIC METABOLIC PANEL
BUN: 16 mg/dL (ref 6–23)
CO2: 26 mEq/L (ref 19–32)
Chloride: 100 mEq/L (ref 96–112)
Creatinine, Ser: 0.8 mg/dL (ref 0.50–1.35)
Glucose, Bld: 173 mg/dL — ABNORMAL HIGH (ref 70–99)

## 2011-09-13 LAB — GLUCOSE, CAPILLARY
Glucose-Capillary: 202 mg/dL — ABNORMAL HIGH (ref 70–99)
Glucose-Capillary: 266 mg/dL — ABNORMAL HIGH (ref 70–99)

## 2011-09-13 NOTE — Progress Notes (Signed)
CARDIAC REHAB PHASE I   PRE:  Rate/Rhythm: 95 AFIB   BP:  Supine: 142/76   Sitting:   Standing:    SaO2:   MODE:  Ambulation: 225 ft   POST:  Rate/Rhythem: 110 AFIB  BP:  Supine: 158/84  Sitting:   Standing:    SaO2:   Timothy Olsen  Pt ambulated 225 ft independently. Tolerated well, although did not want to ambulate further despite repeated encouragement. Pt says he is "lazy" but recognizes that he is weak. Returned to bed. Answered pt's questions re: resting BP and HR and HTN. Still some confusion there. VSS although hypertensive. Will follow up. Electronically signed by Harriett Sine MS on Saturday September 13 2011 at 1321

## 2011-09-13 NOTE — Progress Notes (Signed)
Subjective:  No chest pain or dyspnea  Objective:  Vital Signs in the last 24 hours: Temp:  [97.9 F (36.6 C)-98.7 F (37.1 C)] 98.3 F (36.8 C) (08/03 0548) Pulse Rate:  [63-140] 107  (08/03 0755) Resp:  [16-18] 18  (08/03 0548) BP: (129-179)/(79-107) 129/95 mmHg (08/03 0750) SpO2:  [95 %-96 %] 96 % (08/03 0548) Weight:  [209 lb 11.2 oz (95.119 kg)] 209 lb 11.2 oz (95.119 kg) (08/02 2318)  Intake/Output from previous day: 08/02 0701 - 08/03 0700 In: 1260 [P.O.:1260] Out: 2250 [Urine:2250]   Physical Exam: General: Well developed, well nourished, in no acute distress. Head:  Normal  Lungs: CTA Heart: irregular and tachycardic Abdomen - soft Extremities: No clubbing or cyanosis. No edema. Neurologic: Alert and oriented x 3.    Lab Results:  [facility] 09/12/11 0608 09/11/11 0527  WBC 7.1 8.0  HGB 14.7 13.3  PLT 174 173    Basename 09/13/11 0635 09/12/11 0608  NA 137 139  K 3.7 4.1  CL 100 101  CO2 26 27  GLUCOSE 173* 177*  BUN 16 11  CREATININE 0.80 0.85  Hepatic Function Panel  Basename 09/12/11 0608  PROT 6.6  ALBUMIN 3.1*  AST 26  ALT 156*  ALKPHOS 131*  BILITOT 0.8  BILIDIR --  IBILI --      Assessment/Plan:  Atrial fib heart rate remains elevated. Continue digoxin, beta blocker and Cardizem. Patient also on xeralto. Plan for TEE guided cardioversion on Monday. CAD See cath report.  Continue aspirin. Not on a statin given history of elevated liver functions. Elevated LFTs ?rel to ETOH. LV dysfunction continue ACE inhibitor and beta blocker. He will need repeat echocardiogram 3 months after sinus restored. If ejection fraction less than or equal to 35% would need to consider ICD. Alcohol abuse      Olga Millers, MD, [facility] 09/13/2011, 11:09 AM

## 2011-09-14 LAB — BASIC METABOLIC PANEL
CO2: 28 mEq/L (ref 19–32)
Chloride: 99 mEq/L (ref 96–112)
Creatinine, Ser: 0.95 mg/dL (ref 0.50–1.35)
Sodium: 136 mEq/L (ref 135–145)

## 2011-09-14 LAB — HEPATIC FUNCTION PANEL
ALT: 87 U/L — ABNORMAL HIGH (ref 0–53)
Albumin: 3.3 g/dL — ABNORMAL LOW (ref 3.5–5.2)
Alkaline Phosphatase: 119 U/L — ABNORMAL HIGH (ref 39–117)
Total Protein: 7.2 g/dL (ref 6.0–8.3)

## 2011-09-14 NOTE — Progress Notes (Signed)
Subjective:  No chest pain or dyspnea  Objective:  Vital Signs in the last 24 hours: Temp:  [97.4 F (36.3 C)-98.8 F (37.1 C)] 98.2 F (36.8 C) (08/04 1610) Pulse Rate:  [77-89] 89  (08/04 0608) Resp:  [16-18] 16  (08/04 0608) BP: (134-153)/(78-98) 134/78 mmHg (08/04 0608) SpO2:  [97 %-98 %] 98 % (08/04 9604) Weight:  [207 lb 0.2 oz (93.9 kg)] 207 lb 0.2 oz (93.9 kg) (08/04 5409)  Intake/Output from previous day: 08/03 0701 - 08/04 0700 In: 720 [P.O.:720] Out: 1975 [Urine:1975]   Physical Exam: General: Well developed, well nourished, in no acute distress. Head:  Normal  Lungs: CTA Heart: irregular and tachycardic Abdomen - soft Extremities: No clubbing or cyanosis. No edema. Neurologic: Alert and oriented x 3.    Lab Results:  [facility] 09/12/11 0608  WBC 7.1  HGB 14.7  PLT 174    Basename 09/14/11 0545 09/13/11 0635  NA 136 137  K 4.2 3.7  CL 99 100  CO2 28 26  GLUCOSE 207* 173*  BUN 18 16  CREATININE 0.95 0.80  Hepatic Function Panel  Basename 09/14/11 0545  PROT 7.2  ALBUMIN 3.3*  AST 21  ALT 87*  ALKPHOS 119*  BILITOT 0.7  BILIDIR 0.2  IBILI 0.5      Assessment/Plan:  1) Atrial fib heart - rate remains elevated. Continue digoxin, beta blocker and Cardizem. Patient also on xeralto. Plan for TEE guided cardioversion on Monday. 2) CAD See cath report.  Continue aspirin. Not on a statin given history of elevated liver functions. 3) Elevated LFTs ?rel to ETOH. 4) LV dysfunction continue ACE inhibitor and beta blocker. He will need repeat echocardiogram 3 months after sinus restored. If ejection fraction less than or equal to 35% would need to consider ICD. 5) Alcohol abuse      Olga Millers, MD, [facility] 09/14/2011, 10:12 AM

## 2011-09-15 ENCOUNTER — Inpatient Hospital Stay (HOSPITAL_COMMUNITY): Payer: Managed Care, Other (non HMO) | Admitting: Anesthesiology

## 2011-09-15 ENCOUNTER — Encounter (HOSPITAL_COMMUNITY): Payer: Self-pay | Admitting: *Deleted

## 2011-09-15 ENCOUNTER — Encounter (HOSPITAL_COMMUNITY): Payer: Self-pay | Admitting: Anesthesiology

## 2011-09-15 ENCOUNTER — Encounter (HOSPITAL_COMMUNITY)
Admission: EM | Disposition: A | Discharge status: Home / Self Care | Payer: Self-pay | Source: Home / Self Care | Attending: Internal Medicine

## 2011-09-15 HISTORY — PX: TEE WITHOUT CARDIOVERSION: SHX5443

## 2011-09-15 HISTORY — PX: CARDIOVERSION: SHX1299

## 2011-09-15 LAB — BASIC METABOLIC PANEL
BUN: 16 mg/dL (ref 6–23)
Calcium: 9.7 mg/dL (ref 8.4–10.5)
Creatinine, Ser: 0.82 mg/dL (ref 0.50–1.35)
GFR calc Af Amer: >90 mL/min (ref >90)
GFR calc non Af Amer: >90 mL/min (ref >90)
Glucose, Bld: 195 mg/dL — ABNORMAL HIGH (ref 70–99)
Potassium: 4.4 mEq/L (ref 3.5–5.1)

## 2011-09-15 LAB — GLUCOSE, CAPILLARY
Glucose-Capillary: 151 mg/dL — ABNORMAL HIGH (ref 70–99)
Glucose-Capillary: 236 mg/dL — ABNORMAL HIGH (ref 70–99)

## 2011-09-15 SURGERY — ECHOCARDIOGRAM, TRANSESOPHAGEAL
Anesthesia: Moderate Sedation

## 2011-09-15 MED ORDER — SODIUM CHLORIDE 0.9 % IJ SOLN: 3.0000 mL | Freq: Two times a day (BID) | INTRAMUSCULAR | Status: DC

## 2011-09-15 MED ORDER — LIDOCAINE HCL (CARDIAC) 20 MG/ML IV SOLN
INTRAVENOUS | Status: DC | PRN
  Administered 2011-09-15: 50 mg via INTRAVENOUS

## 2011-09-15 MED ORDER — SODIUM CHLORIDE 0.9 % IV SOLN: 250.0000 mL | INTRAVENOUS | Status: DC | PRN

## 2011-09-15 MED ORDER — SODIUM CHLORIDE 0.45 % IV SOLN: INTRAVENOUS | Status: DC

## 2011-09-15 MED ORDER — SODIUM CHLORIDE 0.9 % IJ SOLN: 3.0000 mL | INTRAMUSCULAR | Status: DC | PRN

## 2011-09-15 MED ORDER — BUTAMBEN-TETRACAINE-BENZOCAINE 2-2-14 % EX AERO
INHALATION_SPRAY | CUTANEOUS | Status: DC | PRN
  Administered 2011-09-15: 2 via TOPICAL

## 2011-09-15 MED ORDER — PROPOFOL 10 MG/ML IV BOLUS
INTRAVENOUS | Status: DC | PRN
  Administered 2011-09-15: 80 mg via INTRAVENOUS

## 2011-09-15 MED ORDER — FENTANYL CITRATE 0.05 MG/ML IJ SOLN
INTRAMUSCULAR | Status: DC | PRN
  Administered 2011-09-15: 25 ug via INTRAVENOUS

## 2011-09-15 MED ORDER — MIDAZOLAM HCL 10 MG/2ML IJ SOLN
INTRAMUSCULAR | Status: DC | PRN
  Administered 2011-09-15 (×2): 2 mg via INTRAVENOUS

## 2011-09-15 MED ORDER — SODIUM CHLORIDE 0.9 % IV SOLN
250.0000 mL | INTRAVENOUS | Status: DC | PRN
  Administered 2011-09-15: 500 mL via INTRAVENOUS

## 2011-09-15 MED ORDER — FENTANYL CITRATE 0.05 MG/ML IJ SOLN
INTRAMUSCULAR | Status: AC
  Filled 2011-09-15: qty 2

## 2011-09-15 MED ORDER — MIDAZOLAM HCL 10 MG/2ML IJ SOLN
INTRAMUSCULAR | Status: AC
  Filled 2011-09-15: qty 2

## 2011-09-15 MED ORDER — SODIUM CHLORIDE 0.9 % IV SOLN
INTRAVENOUS | Status: DC | PRN
  Administered 2011-09-15: 11:00:00 via INTRAVENOUS

## 2011-09-15 NOTE — H&P (View-Only) (Signed)
Subjective:  Appreciate EP input.  For TEE guided CVersion today.  I spoke with Dr. Ross.  Spoke with nursing about logistics.    Objective:  Vital Signs in the last 24 hours: Temp:  [98.1 F (36.7 C)] 98.1 F (36.7 C) (08/05 0529) Pulse Rate:  [74-88] 80  (08/05 0529) Resp:  [18] 18  (08/05 0529) BP: (129-146)/(74-92) 146/92 mmHg (08/05 0529) SpO2:  [97 %-100 %] 100 % (08/05 0529) Weight:  [206 lb 4.8 oz (93.577 kg)] 206 lb 4.8 oz (93.577 kg) (08/05 0529)  Intake/Output from previous day: 08/04 0701 - 08/05 0700 In: 480 [P.O.:480] Out: 1950 [Urine:1950]   Physical Exam: General: Well developed, well nourished, in no acute distress. Head:  Normocephalic and atraumatic. Lungs: Clear to auscultation and percussion. Heart: irregularly irregular rhythm.    Pulses: Pulses normal in all 4 extremities. Extremities: No clubbing or cyanosis. No edema. Neurologic: Alert and oriented x 3.    Lab Results: No results found for this basename: WBC:2,HGB:2,PLT:2 in the last 72 hours  Basename 09/15/11 0648 09/14/11 0545  NA 135 136  K 4.4 4.2  CL 99 99  CO2 23 28  GLUCOSE 195* 207*  BUN 16 18  CREATININE 0.82 0.95   No results found for this basename: TROPONINI:2,CK,MB:2 in the last 72 hours Hepatic Function Panel  Basename 09/14/11 0545  PROT 7.2  ALBUMIN 3.3*  AST 21  ALT 87*  ALKPHOS 119*  BILITOT 0.7  BILIDIR 0.2  IBILI 0.5   No results found for this basename: CHOL in the last 72 hours No results found for this basename: PROTIME in the last 72 hours  Imaging: No results found.  EKG:  Telemetry reviewed and reveals controlled VR.    Assessment/Plan:  Patient Active Hospital Problem List: Hypertension (01/21/2011)   Assessment: improved on current regimen   Plan: continue meds for now.  Would like to get rid of dilt but needed for rate control.    Atrial fibrillation with rapid ventricular response (09/12/2011)   Assessment: For TEE guided CVersion today  Plan: procedure reviewed with patient.   Nursing staff checking on logistics.  K ok. Dig placed on hold for procedure.  Will also hold dilt Non-ST elevation myocardial infarction (NSTEMI), initial episode of care (09/12/2011)   Assessment: no further chest pain   Plan: angios reviewed.         Jeronda Don, MD, FACC, FSCAI 09/15/2011, 8:19 AM    

## 2011-09-15 NOTE — Anesthesia Preprocedure Evaluation (Signed)
Anesthesia Evaluation  Patient identified by MRN, date of birth, ID band Patient awake    Reviewed: Allergy & Precautions, H&P , NPO status , Patient's Chart, lab work & pertinent test results  Airway Mallampati: I TM Distance: >3 FB Neck ROM: Full    Dental   Pulmonary    Pulmonary exam normal       Cardiovascular hypertension, + CAD and + Past MI + dysrhythmias Atrial Fibrillation Rhythm:Irregular Rate:Normal     Neuro/Psych    GI/Hepatic   Endo/Other    Renal/GU      Musculoskeletal   Abdominal   Peds  Hematology   Anesthesia Other Findings   Reproductive/Obstetrics                           Anesthesia Physical Anesthesia Plan  ASA: III  Anesthesia Plan: General   Post-op Pain Management:    Induction: Intravenous  Airway Management Planned: Mask  Additional Equipment:   Intra-op Plan:   Post-operative Plan:   Informed Consent: I have reviewed the patients History and Physical, chart, labs and discussed the procedure including the risks, benefits and alternatives for the proposed anesthesia with the patient or authorized representative who has indicated his/her understanding and acceptance.     Plan Discussed with: CRNA and Surgeon  Anesthesia Plan Comments:         Anesthesia Quick Evaluation

## 2011-09-15 NOTE — Progress Notes (Signed)
1121 Cardioversion 120 joules-NSR.

## 2011-09-15 NOTE — Interval H&P Note (Signed)
History and Physical Interval Note:  09/15/2011 10:59 AM  Timothy Olsen  has presented today for surgery, with the diagnosis of Atrial fib  The various methods of treatment have been discussed with the patient and family. After consideration of risks, benefits and other options for treatment, the patient has consented to  Procedure(s) (LRB): TRANSESOPHAGEAL ECHOCARDIOGRAM (TEE) (N/A) CARDIOVERSION (N/A) as a surgical intervention .  The patient's history has been reviewed, patient examined, no change in status, stable for surgery.  I have reviewed the patient's chart and labs.  Questions were answered to the patient's satisfaction.     Olga Millers

## 2011-09-15 NOTE — Progress Notes (Addendum)
Subjective:  Appreciate EP input.  For TEE guided CVersion today.  I spoke with Dr. Tenny Craw.  Spoke with nursing about logistics.    Objective:  Vital Signs in the last 24 hours: Temp:  [98.1 F (36.7 C)] 98.1 F (36.7 C) (08/05 0529) Pulse Rate:  [74-88] 80  (08/05 0529) Resp:  [18] 18  (08/05 0529) BP: (129-146)/(74-92) 146/92 mmHg (08/05 0529) SpO2:  [97 %-100 %] 100 % (08/05 0529) Weight:  [206 lb 4.8 oz (93.577 kg)] 206 lb 4.8 oz (93.577 kg) (08/05 0529)  Intake/Output from previous day: 08/04 0701 - 08/05 0700 In: 480 [P.O.:480] Out: 1950 [Urine:1950]   Physical Exam: General: Well developed, well nourished, in no acute distress. Head:  Normocephalic and atraumatic. Lungs: Clear to auscultation and percussion. Heart: irregularly irregular rhythm.    Pulses: Pulses normal in all 4 extremities. Extremities: No clubbing or cyanosis. No edema. Neurologic: Alert and oriented x 3.    Lab Results: No results found for this basename: WBC:2,HGB:2,PLT:2 in the last 72 hours  Basename 09/15/11 0648 09/14/11 0545  NA 135 136  K 4.4 4.2  CL 99 99  CO2 23 28  GLUCOSE 195* 207*  BUN 16 18  CREATININE 0.82 0.95   No results found for this basename: TROPONINI:2,CK,MB:2 in the last 72 hours Hepatic Function Panel  Basename 09/14/11 0545  PROT 7.2  ALBUMIN 3.3*  AST 21  ALT 87*  ALKPHOS 119*  BILITOT 0.7  BILIDIR 0.2  IBILI 0.5   No results found for this basename: CHOL in the last 72 hours No results found for this basename: PROTIME in the last 72 hours  Imaging: No results found.  EKG:  Telemetry reviewed and reveals controlled VR.    Assessment/Plan:  Patient Active Hospital Problem List: Hypertension (01/21/2011)   Assessment: improved on current regimen   Plan: continue meds for now.  Would like to get rid of dilt but needed for rate control.    Atrial fibrillation with rapid ventricular response (09/12/2011)   Assessment: For TEE guided CVersion today  Plan: procedure reviewed with patient.   Nursing staff checking on logistics.  K ok. Dig placed on hold for procedure.  Will also hold dilt Non-ST elevation myocardial infarction (NSTEMI), initial episode of care (09/12/2011)   Assessment: no further chest pain   Plan: angios reviewed.         Shawnie Pons, MD, [facility], FSCAI 09/15/2011, 8:19 AM

## 2011-09-15 NOTE — Anesthesia Postprocedure Evaluation (Signed)
  Anesthesia Post-op Note  Patient: Timothy Olsen  Procedure(s) Performed: Procedure(s) (LRB): TRANSESOPHAGEAL ECHOCARDIOGRAM (TEE) (N/A) CARDIOVERSION (N/A)  Patient Location: Endoscopy Unit  Anesthesia Type: General  Level of Consciousness: awake and alert   Airway and Oxygen Therapy: Patient Spontanous Breathing  Post-op Pain: none  Post-op Assessment: Post-op Vital signs reviewed, Patient's Cardiovascular Status Stable, Respiratory Function Stable, Patent Airway, No signs of Nausea or vomiting and Pain level controlled  Post-op Vital Signs: Reviewed and stable  Complications: No apparent anesthesia complications

## 2011-09-15 NOTE — CV Procedure (Signed)
See TEE report in [employer]; no LAA identified; patient subsequently sedated by anesthesia with diprovan 80 mg IV. Synchronized DCCV with 120 joules resulted in sinus rhythm. No immediate complications; continue xeralto for at least 4 weeks following procedure. Olga Millers

## 2011-09-15 NOTE — Progress Notes (Signed)
Pt refused ambulation. Sts he just doesn't feel like walking today after he had anesthesia this am. Will f/u am. Ethelda Chick CES, ACSM

## 2011-09-15 NOTE — Transfer of Care (Signed)
Immediate Anesthesia Transfer of Care Note  Patient: Timothy Olsen  Procedure(s) Performed: Procedure(s) (LRB): TRANSESOPHAGEAL ECHOCARDIOGRAM (TEE) (N/A) CARDIOVERSION (N/A)  Patient Location: Endoscopy Unit  Anesthesia Type: General  Level of Consciousness: awake and alert   Airway & Oxygen Therapy: Patient Spontanous Breathing  Post-op Assessment: Report given to PACU RN, Post -op Vital signs reviewed and stable and Patient moving all extremities  Post vital signs: Reviewed and stable  Complications: No apparent anesthesia complications

## 2011-09-15 NOTE — Progress Notes (Signed)
  Echocardiogram Echocardiogram Transesophageal has been performed.  Hollie Wojahn FRANCES 09/15/2011, 12:31 PM

## 2011-09-16 ENCOUNTER — Encounter (HOSPITAL_COMMUNITY): Payer: Self-pay | Admitting: Cardiology

## 2011-09-16 DIAGNOSIS — I5043 Acute on chronic combined systolic (congestive) and diastolic (congestive) heart failure: Secondary | ICD-10-CM

## 2011-09-16 DIAGNOSIS — R040 Epistaxis: Secondary | ICD-10-CM

## 2011-09-16 LAB — CBC
Hemoglobin: 16.1 g/dL (ref 13.0–17.0)
MCH: 30 pg (ref 26.0–34.0)
RBC: 5.37 MIL/uL (ref 4.22–5.81)

## 2011-09-16 LAB — GLUCOSE, CAPILLARY: Glucose-Capillary: 164 mg/dL — ABNORMAL HIGH (ref 70–99)

## 2011-09-16 LAB — COMPREHENSIVE METABOLIC PANEL
ALT: 47 U/L (ref 0–53)
Alkaline Phosphatase: 100 U/L (ref 39–117)
CO2: 27 mEq/L (ref 19–32)
Calcium: 10 mg/dL (ref 8.4–10.5)
Chloride: 99 mEq/L (ref 96–112)
GFR calc Af Amer: >90 mL/min (ref >90)
GFR calc non Af Amer: >90 mL/min (ref >90)
Glucose, Bld: 223 mg/dL — ABNORMAL HIGH (ref 70–99)
Sodium: 135 mEq/L (ref 135–145)
Total Bilirubin: 0.9 mg/dL (ref 0.3–1.2)

## 2011-09-16 MED ORDER — MAGNESIUM OXIDE 400 (241.3 MG) MG PO TABS: 400.0000 mg | ORAL_TABLET | Freq: Every day | ORAL | Status: DC

## 2011-09-16 MED ORDER — FUROSEMIDE 20 MG PO TABS: 20.0000 mg | ORAL_TABLET | Freq: Every day | ORAL | Status: DC

## 2011-09-16 MED ORDER — ASPIRIN 81 MG PO TBEC: 81.0000 mg | DELAYED_RELEASE_TABLET | Freq: Every day | ORAL | Status: DC

## 2011-09-16 MED ORDER — FOLIC ACID 1 MG PO TABS: 1.0000 mg | ORAL_TABLET | Freq: Every day | ORAL | Status: DC

## 2011-09-16 MED ORDER — RIVAROXABAN 20 MG PO TABS: 20.0000 mg | ORAL_TABLET | Freq: Every day | ORAL | Status: DC

## 2011-09-16 NOTE — Progress Notes (Signed)
CARDIAC REHAB PHASE I   PRE:  Rate/Rhythm: 66 SR  BP:  Supine:   Sitting: 150/90  Standing:    SaO2: 98 RA  MODE:  Ambulation: 150 ft   POST:  Rate/Rhythem: 100  BP:  Supine:   Sitting: 150/96  Standing:    SaO2: 94 RA 1110-1155 Pt able to walk 150 feet c/o of weakness and not feeling. Unable to get pt any further.Completed discharge education with pt. Encouraged him to walk when temperature is not hot. Reviewed CHF education with him. Pt states that he does not have scales, encouraged him to get one. Pt agrees to Outpt. [facility] in [place], will send referral.  Beatrix Fetters

## 2011-09-16 NOTE — Discharge Summary (Signed)
Discharge Summary   Patient ID: Timothy Olsen,  MRN: [id number], DOB/AGE: 55-Nov-1957 55 y.o.  Admit date: 09/08/2011 Discharge date: 09/16/2011  Primary Physician: Lillia Carmel, MD Primary Cardiologist: Shawnie Pons, MD   Discharge Diagnoses Principal Problem:  *Non-ST elevation myocardial infarction (NSTEMI), initial episode of care Active Problems:  Diabetes mellitus  Hypertension  Hyperlipidemia  Atrial fibrillation with rapid ventricular response  Ischemic cardiomyopathy  Epistaxis  Acute on chronic combined systolic and diastolic CHF (congestive heart failure)   Allergies No Known Allergies  Diagnostic Studies/Procedures  CHEST X-RAY - 09/08/11  Comparison: 01/21/2011  Findings: The heart is enlarged. The lungs are free of focal  consolidations and pleural effusions. Mild perihilar bronchitic  changes are present. No edema. Degenerative changes are seen in  the spine.  IMPRESSION:  1. Cardiomegaly without edema.  2. Bronchitic changes.  ABDOMINAL ULTRASOUND - 09/08/11  COMPLETE ABDOMINAL ULTRASOUND  Comparison: None.  Findings:  Gallbladder: Mild gallbladder wall thickening at 4 mm. No  gallstones identified. Negative sonographic Murphy's sign.  Common bile duct: Measures within normal limits at 45 mm.  Liver: No focal lesion identified. Within normal limits in  parenchymal echogenicity.  IVC: Appears normal.  Pancreas: No focal abnormality identified within the head/neck.  The body and tail are obscured by overlying bowel gas artifact.  Spleen: Measures 10.1 cm. No focal abnormality.  Right Kidney: Measures 11.9 cm. No hydronephrosis or focal  abnormality.  Left Kidney: Measures 12.0 cm. No hydronephrosis or focal  abnormality.  Abdominal aorta: Measures 2.5 cm. No aneurysmal dilatation  identified.  Small right pleural effusion noted. Small amount of  intraperitoneal fluid noted inferior to the liver.  IMPRESSION:  Mild gallbladder wall  thickening is nonspecific,can be seen in the  setting of inflammation/infection, hypervolemia, or hepatitis.  No cholelithiasis or other sonographic evidence for cholecystitis.  Small amount of free intraperitoneal fluid.  Small right pleural effusion.  CARDIAC CATHETERIZATION - 09/09/11  Hemodynamics:  AO 139/97  LV 139/29  Coronary angiography:  Coronary dominance: right  Left mainstem: Widely patent with minor luminal irregularity.  Left anterior descending (LAD): Patent to the left ventricular apex. The first diagonal branch is patent. There is mild proximal LAD calcification and also mild luminal irregularity but there are no significant stenoses throughout the course of the LAD distribution.  Left circumflex (LCx): The left circumflex is a large-caliber vessel. The proximal and mid circumflex have mild diffuse irregularity. The first OM is patent but in the midportion of this vessel there is 50-75% stenosis. There is also a subbranch that has tight ostial stenosis but this is a very small vessel. The percent stenosis is estimated at 80-90. The continuation of the AV groove circumflex supplies a posterolateral branch which also has 80-90% stenosis. This is a very small vessel less than 1.5 mm in caliber.  Right coronary artery (RCA): The right coronary artery is dominant. There is mild luminal irregularity in the proximal vessel. The mid vessel has 40-50% stenosis. The distal vessel has luminal irregularity. The PDA and posterolateral branches are patent  Left ventriculography: The left ventricle is poorly visualized with ventriculography, despite a 30 cc injection. However, there is clearly global left ventricular dysfunction and I would estimate the ejection fraction in the range of 30%.  Final Conclusions:  1. Distal vessel coronary artery disease primarily in the left circumflex distribution as detailed above  2. Nonobstructive LAD and right coronary artery stenoses  3. Severe global  left ventricular dysfunction  TRANSTHORACIC ECHOCARDIOGRAM -  09/09/11  Study Conclusions  - Left ventricle: The cavity size was mildly dilated. Wall thickness was increased in a pattern of mild LVH. Systolic function was severely reduced. The estimated ejection fraction was in the range of 25% to 30%. Global hypokinesis but severe hypokinesis to akinesis of the basal to mid inferior and posterior walls. Doppler parameters are consistent with restrictive physiology, indicative of decreased left ventricular diastolic compliance and/or increased left atrial pressure. - Aortic valve: There was no stenosis. - Mitral valve: Mild regurgitation. - Left atrium: The atrium was moderately dilated. - Right ventricle: The cavity size was normal. Systolic function was moderately reduced. - Right atrium: The atrium was moderately dilated. - Tricuspid valve: Peak RV-RA gradient: 30mm Hg (S). - Pulmonary arteries: PA peak pressure: 50mm Hg (S). - Systemic veins: IVC measured 3.0 cm with minimal respirophasic variation, suggesting RA pressure 20 mmHg. Impressions:  - The patient was in atrial fibrillation. Mildly dilated LV with mild LV hypertrophy. EF 25-30%. There is global hypokinesis but it appears regionally worse in the posterior and inferior walls (basal to mid inferior and posterior). Severe diastolic dysfunction. Normal RV size with moderately decreased systolic function. Mild to moderate pulmonary hypertension.  NASAL SEPTUM CAUTERIZATION - 09/11/11  Procedure Note: 30901-Left-left control of nasal hemorrhage, simple: Informed verbal consent was obtained after explaining the risks (including bleeding and infection), benefits and alternatives of the procedure. Verbal timeout was performed prior to the procedure. The nose was examined with a headlight and speculum. The septum showed bilateral anterior septal excoriation with vessels more prominent on the left. The left septum was  thoroughly cauterized with silver nitrate. A single excoriated vessel on the right anterior septum was gently and sparingly cauterized with the silver nitrate. The patient tolerated the procedure with no immediate complications.   TRANSESOPHAGEAL ECHOCARDIOGRAM - 09/15/11  Study Conclusions  - Left ventricle: Systolic function was moderately to severely reduced. The estimated ejection fraction was in the range of 30% to 35%. Diffuse hypokinesis. - Aortic valve: No evidence of vegetation. - Mitral valve: No evidence of vegetation. - Left atrium: The atrium was moderately dilated. No evidence of thrombus in the atrial cavity or appendage. There was mild spontaneous echo contrast ("smoke") in the cavity. - Right ventricle: Systolic function was mildly reduced. - Right atrium: The atrium was mildly dilated. - Atrial septum: No defect or patent foramen ovale was identified. - Tricuspid valve: No evidence of vegetation.  DCCV PROCEDURE NOTE - 09/15/11  no LAA identified; patient subsequently sedated by anesthesia with diprovan 80 mg IV. Synchronized DCCV with 120 joules resulted in sinus rhythm. No immediate complications; continue xeralto for at least 4 weeks following procedure.  History of Present Illness  Mr. Lomeli is a 56yo African American male with the above problem list who was admitted to [facility] on 09/08/11 with NSTEMI in the setting of atrial fibrillation + RVR.   He had reported malaise, increased fatigue and mild left-sided chest pain with radiation to his back and associated shortness of breath and palpitations for 3-4 days beginning on a Friday leading up to admission. He took several NTG SL with some relief initially. He continued to have intermittent symptoms through the weekend. He went to work on Monday, felt okay, then experienced worsening symptoms Monday afternoon, thus prompting his presentation to the [facility] ED. There, EKG and telemetry revealed atrial  fibrillation + RVR. Cardiac biomarkers did reveal a mildly elevated troponin-I. pBNP did returned mildly elevated. CXR as above  did reveal cardiomegaly with bronchitic changes, but no pulmonary edema. He was started on ASA and loaded with Plavix. Cardizem and heparin IV were continued. Outpatient Lopressor and Lisinopril was continued. Cardiology was consulted. The patient had declined cardiac catheterization in the past for unspecified reasons. He was noted to be admittedly noncompliant with medications and follow-up, and did endorse continued alcohol abuse. The plan was made to admit, achieve rate-control and plan for cardiac catheterization for further ischemic evaluation the following morning.   Hospital Course  His chest pain was well-controlled overnight. CMET in the ED did reveal mildly elevated transaminases, and subsequently statin was not initiated. Abdominal ultrasound as above revealed no acute hepatobiliary process. He was initially found to have a CHADS2 score of 3 warranting oral anticoagulation, however the specific agent was not determined initially due to concerns of EtOH abuse and compliance. This was determined on EP consultation later in the admission, and heparin was resumed. Outpatient was metformin and glipizide were held in anticipation of cardiac catheterization, and SSI was initiated. Evidence of acute CHF was suspected to be caused by the patient's atrial fibrillation with RVR, however underlying combined systolic and diastolic dysfunction was also suspected. The following day, his fatigue persisted, and HR remained elevated on Cardizem gtt. The plan was made to purse diagnostic cardiac catheterization.   He was informed, consented and prepped for the procedure which is outlined in full above. This was notable for widely patent left main with minor luminal irregularities, patent LAD, mild prox LAD calcification and luminal irregularities, patent D1, diffuse prox and LCX- mid  irregularity, 50-75% mid OM1 stenosis, 80-90% ostial stenosis in small subbranch, small PLB 80-90% stenosis, RCA- mild proximal luminal irregularity, mid 40-50% stenosis, distal irregularity, PDA and PLB patent; LVEF 30% and global LV dysfunction. The patient tolerated the procedure well without complications. The recommendation was made to continue medical therapy.   Cardiac biomarkers returned mildly elevated, but plateaued. A TTE was ordered, outlined above, revealing LVEF 25-30%, global hypokinesis but severe hypokinesis-akinesis of basal to mid inferior and posterior walls, features consistent with restrictive physiology, decreased LV diastolic compliance/increased LA pressures, mild LVH, normal RV size with moderately decreased systolic function and mild-moderate pHTN. He was gently diuresed. LFTs were found to be trending down. CIWA protocol was initiated to assist as HR and BP were assessed to be increasing. Dr. Riley Kill discussed long term management with the patient who understood the need for anticoagulation and to stop abusing alcohol, and agreed to proceed with chronic Coumadin anticoagulation with regular office follow-up. He did unfortunately develop recurrent epistaxis on heparin and Coumadin. He became frustrated and heparin was discontinued. He was evaluated by ENT who performed silver nitrate cauterization of the L nare with improvement.   The patient ambulated well with cardiac rehab. His rates continued to be difficult to control requiring diltiazem IV titrations, Lopressor and digoxin transiently. EP was consulted for this reason. The recommendation was made to purse TEE/DCCV +/- antiarrhythmic support to maintain NSR. Anticoagulation was also switched to Xarelto per EP recommendations. His symptoms improved and he continued to diurese daily with stable renal function and electrolytes. Total I/O: - 8463.0. Admission weight: 222 lbs. Discharge weight: 204 lbs. He underwent TEE/DCCV. As above,  no LA thrombus was visualized, and he was successfully cardioverted to NSR. This was maintained. The patient was assessed by Dr. Riley Kill this morning who found him stable for discharge. He was provided with 20 days of Xarelto tablets. Case management has seen the patient  and provided him with a copay card. He will follow-up soon in the office as scheduled below. He will be discharged on the medications below. A repeat 2D echo has been scheduled in 3 months to re-assess EF and need for ICD for primary prevention. He has been advised to start his oral hypoglycemics in a consecutive manner in the next two days. There were no symptoms/signs of withdrawal throughout the admission. He was advised not to drive x 2 weeks per Dr. Riley Kill. LFTs remained stable and WNL. Statin resumption will be discussed on follow-up. This information has been clearly outlined in the discharge AVS.    Discharge Vitals:  Blood pressure 159/89, pulse 58, temperature 98.4 F (36.9 C), temperature source Oral, resp. rate 18, height 6\' 4"  (1.93 m), weight 92.761 kg (204 lb 8 oz), SpO2 97.00%.  Weight change: -0.816 kg (-1 lb 12.8 oz)  Labs: Recent Labs  [facility] 09/16/11 0834   WBC 9.7   HGB 16.1   HCT 48.6   MCV 90.5   PLT 260   Lab 09/16/11 0834 09/15/11 0648 09/14/11 0545  NA 135 135 136  K 4.1 4.4 4.2  CL 99 99 99  CO2 27 23 28   BUN 19 16 18   CREATININE 0.86 0.82 0.95  CALCIUM 10.0 9.7 9.9  PROT 7.5 -- --  BILITOT 0.9 -- --  ALKPHOS 100 -- --  ALT 47 -- --  AST 15 -- --  AMYLASE -- -- --  LIPASE -- -- --  GLUCOSE 223* 195* 207*   Disposition:  Discharge Orders    Future Appointments: Provider: Department: Dept Phone: Center:   09/23/2011 2:20 PM Beatrice Lecher, PA [facility] [phone] [id number]   12/17/2011 9:30 AM [facility]  None     Future Orders Please Complete By Expires   Amb Referral to Cardiac Rehabilitation        Follow-up Information    Follow up with  Tereso Newcomer, PA on 09/23/2011. (At 2:20 PM for follow-up after this hospitalization. )    Contact information:   [address] [place] [address] [phone]       Follow up with [facility] on 12/17/2011. (At 9:30 AM for repeat heart imaging (echocardiogram). )    Contact information:   [address] [place] [address]       Schedule an appointment as soon as possible for a visit with Lillia Carmel, MD. (In 1-2 weeks. )    Contact information:   [address] [place] [address] [phone]          Discharge Medications:  Medication List  As of 09/16/2011  4:42 PM   START taking these medications         aspirin 81 MG EC tablet   Take 1 tablet (81 mg total) by mouth daily.      folic acid 1 MG tablet   Commonly known as: FOLVITE   Take 1 tablet (1 mg total) by mouth daily.      furosemide 20 MG tablet   Commonly known as: LASIX   Take 1 tablet (20 mg total) by mouth daily.      magnesium oxide 400 (241.3 MG) MG tablet   Commonly known as: MAG-OX   Take 1 tablet (400 mg total) by mouth daily.      Rivaroxaban 20 MG Tabs   Commonly known as: XARELTO   Take  1 tablet (20 mg total) by mouth daily with supper.   Start taking on: 10/07/2011         CONTINUE taking these medications         colchicine 0.6 MG tablet   Take 1 tablet (0.6 mg total) by mouth daily. Take 2 now and one in one hour.  Take one twice daily following      glipiZIDE 5 MG tablet   Commonly known as: GLUCOTROL      lisinopril 20 MG tablet   Commonly known as: PRINIVIL,ZESTRIL      metFORMIN 500 MG tablet   Commonly known as: GLUCOPHAGE      metoprolol 50 MG tablet   Commonly known as: LOPRESSOR      nitroGLYCERIN 0.4 MG SL tablet   Commonly known as: NITROSTAT   Place 1 tablet (0.4 mg total) under the tongue every 5 (five) minutes as needed for chest pain.         ASK your doctor about these  medications         pravastatin 40 MG tablet   Commonly known as: PRAVACHOL          Where to get your medications    These are the prescriptions that you need to pick up. We sent them to a specific pharmacy, so you will need to go there to get them.   [address] [place], [place] - [address]    [address] [place] [address] [place] [address]    Phone: [phone]        folic acid 1 MG tablet   furosemide 20 MG tablet   magnesium oxide 400 (241.3 MG) MG tablet   Rivaroxaban 20 MG Tabs         Information on where to get these meds is not yet available. Ask your nurse or doctor.         aspirin 81 MG EC tablet           Outstanding Labs/Studies: 2D echo on 12/17/11  Duration of Discharge Encounter: Greater than 30 minutes including physician time.  Signed, R. Hurman Horn, PA-C 09/16/2011, 4:42 PM

## 2011-09-16 NOTE — Progress Notes (Signed)
Subjective:  He is better but remains weak.  No chest pain.  Ambulating.  Back in NSR.  I brought him 20 Xarelto tablets, to be taken one per day.    Objective:  Vital Signs in the last 24 hours: Temp:  [98 F (36.7 C)-98.1 F (36.7 C)] 98.1 F (36.7 C) (08/06 0453) Pulse Rate:  [55-92] 55  (08/06 0453) Resp:  [14-27] 20  (08/06 0453) BP: (113-162)/(52-105) 156/77 mmHg (08/06 0453) SpO2:  [94 %-100 %] 95 % (08/06 0453) Weight:  [204 lb 8 oz (92.761 kg)] 204 lb 8 oz (92.761 kg) (08/06 0453)  Intake/Output from previous day: 08/05 0701 - 08/06 0700 In: 2090 [P.O.:1440; I.V.:650] Out: 2205 [Urine:2205]   Physical Exam: General: Well developed, well nourished, in no acute distress. Head:  Normocephalic and atraumatic. Lungs: Clear to auscultation and percussion. Heart: Normal S1 and S2.  Prominent S4 Pulses: Pulses normal in all 4 extremities. Extremities: No clubbing or cyanosis. No edema. Neurologic: Alert and oriented x 3.    Lab Results: No results found for this basename: WBC:2,HGB:2,PLT:2 in the last 72 hours  Basename 09/15/11 0648 09/14/11 0545  NA 135 136  K 4.4 4.2  CL 99 99  CO2 23 28  GLUCOSE 195* 207*  BUN 16 18  CREATININE 0.82 0.95   No results found for this basename: TROPONINI:2,CK,MB:2 in the last 72 hours Hepatic Function Panel  Basename 09/14/11 0545  PROT 7.2  ALBUMIN 3.3*  AST 21  ALT 87*  ALKPHOS 119*  BILITOT 0.7  BILIDIR 0.2  IBILI 0.5   No results found for this basename: CHOL in the last 72 hours No results found for this basename: PROTIME in the last 72 hours  Imaging: No results found.  EKG:  Telemetry with av rate around 60   Assessment/Plan:  Patient Active Hospital Problem List: Diabetes mellitus (01/21/2011)   Assessment: glucoses mildly elevated   Plan: will have him start glucophage at home before his second agent.  He does monitor his sugars at home.  Hypertension (01/21/2011)   Assessment: seems better, perhaps  not perfect.     Plan: home on current agents.  Needs PA follow up in our office in the next 7 days.  Atrial fibrillation with rapid ventricular response (09/12/2011)   Assessment: now cardioverted   Plan: maintaining NSR.  Will recheck ECG and home later today.   Non-ST elevation myocardial infarction (NSTEMI), initial episode of care (09/12/2011)   Assessment: Stable with no recurrent chest pain.     Plan: I have told him not to drive now for 2 weeks and follow up in our office.  Warfarin anticoagulation (09/12/2011)   Assessment: no longer taking.  On Xarelto per EPS consult Ischemic cardiomyopathy (09/12/2011)   Assessment: cardiomyopathy could be due to alcohol and afib, with ischemic component, but see cath report   Plan: Needs early follow up echo in office.  I have spent more than thirty minutes on a variety of topics.  These include 1.  Need for follow up with his primary MD who I will call. 2.  Early follow up at [facility] as schedule allows 3.  Need for absolute abstinence from alcohol. 4.  No driving for two weeks 5.  Need for close follow up with repeat echocardiography.  I do believe he needs short term disability.  Awaiting CMET and CBC, ECG, then dc later today.         Shawnie Pons, MD, [facility], FSCAI 09/16/2011, 8:12 AM

## 2011-09-17 ENCOUNTER — Telehealth: Payer: Self-pay | Admitting: Internal Medicine

## 2011-09-17 NOTE — Telephone Encounter (Signed)
[facility] calling re pt's xarelto not being covered by his insurance, can he get another med? Also rx was written by [place] the pa, they need his dea number

## 2011-09-18 NOTE — Discharge Summary (Signed)
Please see my notes.  TS

## 2011-09-19 ENCOUNTER — Emergency Department (HOSPITAL_COMMUNITY): Payer: Managed Care, Other (non HMO)

## 2011-09-19 ENCOUNTER — Telehealth: Payer: Self-pay | Admitting: *Deleted

## 2011-09-19 ENCOUNTER — Emergency Department (HOSPITAL_COMMUNITY)
Admission: EM | Admit: 2011-09-19 | Discharge: 2011-09-19 | Disposition: A | Discharge status: Home / Self Care | Payer: Managed Care, Other (non HMO) | Attending: Emergency Medicine | Admitting: Emergency Medicine

## 2011-09-19 ENCOUNTER — Encounter (HOSPITAL_COMMUNITY): Payer: Self-pay | Admitting: *Deleted

## 2011-09-19 DIAGNOSIS — R0602 Shortness of breath: Secondary | ICD-10-CM | POA: Insufficient documentation

## 2011-09-19 DIAGNOSIS — R071 Chest pain on breathing: Secondary | ICD-10-CM | POA: Insufficient documentation

## 2011-09-19 DIAGNOSIS — I2510 Atherosclerotic heart disease of native coronary artery without angina pectoris: Secondary | ICD-10-CM | POA: Insufficient documentation

## 2011-09-19 DIAGNOSIS — R0789 Other chest pain: Secondary | ICD-10-CM

## 2011-09-19 DIAGNOSIS — R079 Chest pain, unspecified: Secondary | ICD-10-CM | POA: Insufficient documentation

## 2011-09-19 DIAGNOSIS — E119 Type 2 diabetes mellitus without complications: Secondary | ICD-10-CM | POA: Insufficient documentation

## 2011-09-19 DIAGNOSIS — Z79899 Other long term (current) drug therapy: Secondary | ICD-10-CM | POA: Insufficient documentation

## 2011-09-19 DIAGNOSIS — I10 Essential (primary) hypertension: Secondary | ICD-10-CM | POA: Insufficient documentation

## 2011-09-19 LAB — BASIC METABOLIC PANEL
BUN: 24 mg/dL — ABNORMAL HIGH (ref 6–23)
GFR calc Af Amer: >90 mL/min (ref >90)
GFR calc non Af Amer: >90 mL/min (ref >90)
Potassium: 4.6 mEq/L (ref 3.5–5.1)
Sodium: 139 mEq/L (ref 135–145)

## 2011-09-19 LAB — PROTIME-INR: Prothrombin Time: 24.8 seconds — ABNORMAL HIGH (ref 11.6–15.2)

## 2011-09-19 LAB — CBC
HCT: 49.1 % (ref 39.0–52.0)
MCHC: 33.4 g/dL (ref 30.0–36.0)
Platelets: 331 10*3/uL (ref 150–400)
RDW: 15.2 % (ref 11.5–15.5)
WBC: 8.6 10*3/uL (ref 4.0–10.5)

## 2011-09-19 LAB — POCT I-STAT TROPONIN I: Troponin i, poc: 0.13 ng/mL (ref 0.00–0.08)

## 2011-09-19 LAB — CARDIAC PANEL(CRET KIN+CKTOT+MB+TROPI): Total CK: 75 U/L (ref 7–232)

## 2011-09-19 MED ORDER — MORPHINE SULFATE 4 MG/ML IJ SOLN
4.0000 mg | Freq: Once | INTRAMUSCULAR | Status: AC
  Administered 2011-09-19: 4 mg via INTRAVENOUS
  Filled 2011-09-19: qty 1

## 2011-09-19 MED ORDER — DIAZEPAM 5 MG PO TABS
5.0000 mg | ORAL_TABLET | Freq: Once | ORAL | Status: AC
  Administered 2011-09-19: 5 mg via ORAL
  Filled 2011-09-19: qty 1

## 2011-09-19 MED ORDER — DIAZEPAM 5 MG PO TABS: 5.0000 mg | ORAL_TABLET | Freq: Three times a day (TID) | ORAL | Status: DC | PRN

## 2011-09-19 MED ORDER — OXYCODONE-ACETAMINOPHEN 5-325 MG PO TABS: 2.0000 | ORAL_TABLET | ORAL | Status: DC | PRN

## 2011-09-19 NOTE — ED Notes (Signed)
Heat pad applied to right chest per MD order

## 2011-09-19 NOTE — ED Notes (Signed)
States he walked yest afternoon and upon returning home c/o "spasm left side of his chest" states he took 3 NTG with relief however pain returned 12mn took another and 1 asa , states pain is worse with movement Denies sob or nausea.

## 2011-09-19 NOTE — ED Notes (Signed)
Critical labs shown to Dr.Otter 

## 2011-09-19 NOTE — ED Provider Notes (Signed)
History     CSN: [id number]  Arrival date & time 09/19/11  0335   First MD Initiated Contact with Patient 09/19/11 [phone]      Chief Complaint  Patient presents with  . Chest Pain    (Consider location/radiation/quality/duration/timing/severity/associated sxs/prior treatment) HPI 56 year old male presents to emergency room complaining of left-sided chest pain. Patient was discharged from the hospital on Tuesday after having an STEMI, A. fib with RVR status post electrocardioversion. Patient had a catheterization last week that showed diffuse disease but no intervention required. Patient reports yesterday morning around 11 AM he walked about a mile to pay his rent. Later that afternoon he began to have spasming "contractions" on the left side of his chest and left lower back. Patient took 3 nitroglycerin and aspirin without significant improvement. Pain has gotten steadily worse, waking him at around 3 AM, prompting him to come to the emergency department pain is worse with movement, palpation of the chest wall. He denies any shortness of breath, no nausea no diaphoresis the  Past Medical History  Diagnosis Date  . Hypertension   . Diabetes mellitus   . Coronary artery disease   . Persistent atrial fibrillation   . Ischemic cardiomyopathy   . Epistaxis 08/2011    On heparin and Coumadin s/p silver nitrate cauterization L nare  . Hyperlipidemia   . Chronic combined systolic and diastolic CHF (congestive heart failure)     a) 07/13 echo: LVEF 25-30%, global hypokinesis but severe hypokinesis-akinesis of basal to mid inferior and posterior walls, features consistent with restrictive physiology, decreased LV diastolic compliance/increased LA pressures, mild LVH, normal RV size with moderately decreased systolic function and mild-moderate pHTN. b) TEE 08/13: LVEF 30-35%, diffuse hypokinesis, no vegetation or thrombus    Past Surgical History  Procedure Date  . Tonsillectomy   . Left thumb  surgery   . Adenoidectomy   . Tee without cardioversion 09/15/2011    Procedure: TRANSESOPHAGEAL ECHOCARDIOGRAM (TEE);  Surgeon: Lewayne Bunting, MD;  Location: [facility] ENDOSCOPY;  Service: Cardiovascular;  Laterality: N/A;  . Cardioversion 09/15/2011    Procedure: CARDIOVERSION;  Surgeon: Lewayne Bunting, MD;  Location: [facility] ENDOSCOPY;  Service: Cardiovascular;  Laterality: N/A;  . Cardiac catheterization 09/09/2011    a) widely patent left main with minor luminal irregularities, patent LAD, mild prox LAD calcification and luminal irregularities, patent D1, diffuse prox and LCX- mid irregularity, 50-75% mid OM1 stenosis, 80-90% ostial stenosis in small subbranch, small PLB 80-90% stenosis, RCA- mild proximal luminal irregularity, mid 40-50% stenosis, distal irregularity, PDA and PLB patent; LVEF 30% and global L    Family History  Problem Relation Age of Onset  . Diabetes type II Mother   . Hypertension Father     History  Substance Use Topics  . Smoking status: Former Smoker -- 0.5 packs/day for 2 years    Types: Cigarettes  . Smokeless tobacco: Never Used  . Alcohol Use: 3.0 oz/week    6 drink(s) per week     rarely      Review of Systems  Allergies  Review of patient's allergies indicates no known allergies.  Home Medications   Current Outpatient Rx  Name Route Sig Dispense Refill  . ASPIRIN 81 MG PO TBEC Oral Take 1 tablet (81 mg total) by mouth daily.    . COLCHICINE 0.6 MG PO TABS Oral Take 1 tablet (0.6 mg total) by mouth daily. Take 2 now and one in one hour.  Take one twice daily following 20  tablet 0  . FOLIC ACID 1 MG PO TABS Oral Take 1 tablet (1 mg total) by mouth daily. 30 tablet 0  . FUROSEMIDE 20 MG PO TABS Oral Take 1 tablet (20 mg total) by mouth daily. 30 tablet 3  . GLIPIZIDE 5 MG PO TABS Oral Take 5 mg by mouth 2 (two) times daily before a meal.      . LISINOPRIL 20 MG PO TABS Oral Take 20 mg by mouth daily.      Marland Kitchen MAGNESIUM OXIDE 400 (241.3 MG) MG PO TABS Oral  Take 1 tablet (400 mg total) by mouth daily. 30 tablet 0  . METFORMIN HCL 500 MG PO TABS Oral Take 1,000 mg by mouth 2 (two) times daily with a meal.      . METOPROLOL TARTRATE 50 MG PO TABS Oral Take 100 mg by mouth 2 (two) times daily.     Marland Kitchen NITROGLYCERIN 0.4 MG SL SUBL Sublingual Place 1 tablet (0.4 mg total) under the tongue every 5 (five) minutes as needed for chest pain. 31 tablet 1  . RIVAROXABAN 20 MG PO TABS Oral Take 1 tablet (20 mg total) by mouth daily with supper. 30 tablet 3    BP 130/73  Pulse 56  SpO2 100%  Physical Exam  Nursing note and vitals reviewed. Constitutional: He is oriented to person, place, and time. He appears well-developed and well-nourished. He appears distressed (uncomfortable appearing).  HENT:  Head: Normocephalic and atraumatic.  Nose: Nose normal.  Mouth/Throat: Oropharynx is clear and moist.  Eyes: Conjunctivae and EOM are normal. Pupils are equal, round, and reactive to light.  Neck: Normal range of motion. Neck supple. No JVD present. No tracheal deviation present. No thyromegaly present.  Cardiovascular: Normal rate, regular rhythm, normal heart sounds and intact distal pulses.  Exam reveals no gallop and no friction rub.   No murmur heard. Pulmonary/Chest: Effort normal and breath sounds normal. No stridor. No respiratory distress. He has no wheezes. He has no rales. He exhibits tenderness (left chest wall tenderness, palpation reproduces pain).  Abdominal: Soft. Bowel sounds are normal. He exhibits no distension and no mass. There is no tenderness. There is no rebound and no guarding.  Musculoskeletal: Normal range of motion. He exhibits no edema and no tenderness.  Lymphadenopathy:    He has no cervical adenopathy.  Neurological: He is alert and oriented to person, place, and time. He exhibits normal muscle tone. Coordination normal.  Skin: Skin is warm and dry. No rash noted. No erythema. No pallor.  Psychiatric: He has a normal mood and  affect. His behavior is normal. Judgment and thought content normal.    ED Course  Procedures (including critical care time)  Labs Reviewed  BASIC METABOLIC PANEL - Abnormal; Notable for the following:    Glucose, Bld 130 (*)     BUN 24 (*)     All other components within normal limits  PROTIME-INR - Abnormal; Notable for the following:    Prothrombin Time 24.8 (*)     INR 2.20 (*)     All other components within normal limits  POCT I-STAT TROPONIN I - Abnormal; Notable for the following:    Troponin i, poc 0.13 (*)     All other components within normal limits  CBC  CARDIAC PANEL(CRET KIN+CKTOT+MB+TROPI)   Dg Chest 2 View  09/19/2011  *RADIOLOGY REPORT*  Clinical Data: Chest pain and shortness of breath.  CHEST - 2 VIEW  Comparison: 09/08/2011  Findings: Shallow inspiration.  Borderline heart size with normal pulmonary vascularity.  No focal airspace consolidation in the lungs.  No blunting of costophrenic angles.  No pneumothorax. Mediastinal contours appear intact.  Degenerative changes in the thoracic spine.  No significant change since previous study.  IMPRESSION: Borderline heart size.  No evidence of active pulmonary disease.  Original Report Authenticated By: Marlon Pel, M.D.    Date: 09/19/2011  Rate: *72  Rhythm: normal sinus rhythm and premature ventricular contractions (PVC)  QRS Axis: normal  Intervals: normal  ST/T Wave abnormalities: nonspecific T wave changes  Conduction Disutrbances:none  Narrative Interpretation:   Old EKG Reviewed: unchanged   1. Chest wall pain       MDM  56 year old male who is status post an STEMI along with recent cardioversion for A. fib with RVR. Patient with left-sided chest wall pain cramping. Negative troponins here, recent cath showing mild diffuse disease not requiring intervention. Discussed with on-call [facility] cardiology fellow, who will have patient contacted later today for recheck.  Pain appears to be msk in origin,  and pt is beginning to feel better with pain medications       Olivia Mackie, MD 09/19/11 [phone]

## 2011-09-19 NOTE — Telephone Encounter (Signed)
Called patient in regards to switching to Coumadin.  He will need a weeks worth of samples and then he can be changed over to Coumadin per Dr Ladona Ridgel.  He and Dr Riley Kill have discussed and this is what is best as patients insurance will not cover the Xarelto

## 2011-09-23 ENCOUNTER — Encounter: Payer: Self-pay | Admitting: Physician Assistant

## 2011-09-23 ENCOUNTER — Encounter: Payer: Managed Care, Other (non HMO) | Admitting: Physician Assistant

## 2011-09-23 ENCOUNTER — Ambulatory Visit (INDEPENDENT_AMBULATORY_CARE_PROVIDER_SITE_OTHER): Payer: Managed Care, Other (non HMO) | Admitting: Physician Assistant

## 2011-09-23 VITALS — BP 124/70 | HR 135 | Ht 76.0 in | Wt 204.0 lb

## 2011-09-23 DIAGNOSIS — I10 Essential (primary) hypertension: Secondary | ICD-10-CM

## 2011-09-23 DIAGNOSIS — R079 Chest pain, unspecified: Secondary | ICD-10-CM

## 2011-09-23 DIAGNOSIS — F1010 Alcohol abuse, uncomplicated: Secondary | ICD-10-CM

## 2011-09-23 DIAGNOSIS — I2510 Atherosclerotic heart disease of native coronary artery without angina pectoris: Secondary | ICD-10-CM

## 2011-09-23 DIAGNOSIS — I5042 Chronic combined systolic (congestive) and diastolic (congestive) heart failure: Secondary | ICD-10-CM

## 2011-09-23 DIAGNOSIS — I4891 Unspecified atrial fibrillation: Secondary | ICD-10-CM

## 2011-09-23 DIAGNOSIS — E785 Hyperlipidemia, unspecified: Secondary | ICD-10-CM

## 2011-09-23 MED ORDER — AMIODARONE HCL 200 MG PO TABS: 400.0000 mg | ORAL_TABLET | Freq: Two times a day (BID) | ORAL | Status: DC

## 2011-09-23 MED ORDER — METOPROLOL TARTRATE 50 MG PO TABS: 75.0000 mg | ORAL_TABLET | Freq: Two times a day (BID) | ORAL | Status: DC

## 2011-09-23 NOTE — Patient Instructions (Addendum)
You have an appointment, Thursday, August 15 at 3:20 with Tereso Newcomer, PA-C  Your physician has recommended you make the following change in your medication:  START Amiodarone 400mg  twice a day,              INCREASE Lopressor (metoprolol)  75 mg twice a day.                DO NOT MISS A DOSE OF XARELTO   Your physician recommends that you have lab work today: bmet, tsh, and lfts

## 2011-09-23 NOTE — Progress Notes (Signed)
[address] [place], [place]  [address] Phone: [phone] Fax:  [phone]  Date:  09/23/2011   Name:  Timothy Olsen   DOB:  09-02-1955   MRN:  [id number]  PCP:  Lillia Carmel, MD  Primary Cardiologist:  Dr.  Shawnie Pons  Primary Electrophysiologist:  None    History of Present Illness: Timothy Olsen is a 56 y.o. male who returns for post hospital follow.  He has a history of DM 2, HTN, HL and reduced LV function. He's been seen several times in the hospital over the last year with recommendations for cardiac catheterization. The patient has refused to proceed with cardiac catheterization.  He was admitted 7/29-8/6 with a non-STEMI and acute CHF in the setting of atrial fibrillation with RVR.  LHC 09/09/11: Mid OM1 50-75%, ostial subbranch with tight stenosis (very small) 80-90%, posterior lateral branch supplied by AV groove circumflex 80-90%-very small less than 1.5 mm), mid RCA 40-50%, EF 30%. Medical therapy recommended.  Echocardiogram 09/09/11: Mild LVH, EF 25-30% global HK with severe HK to AK of the basal to mid inferior and posterior walls, restrictive physiology (severe diastolic dysfunction), mild MR, moderate LAE, moderately reduced RV function, moderate RAE, PASP 50 (mild to moderate pulmonary hypertension).  The patient was felt to require long-term anticoagulation for atrial fibrillation. He was started on Coumadin but developed epistaxis. He required cauterization by ENT. He was switched to Xarelto. TEE-DCCV was arranged and he was cardioverted to sinus rhythm. Patient had good diuresis (greater than 8 L) and his discharge weight was 18 pounds lighter than admission. Statin was not initiated due to elevated LFTs in the setting of alcohol abuse and this was to be discussed at followup.  Notes he went to the ED several days after d/c for left chest pain.  CXR was ok.  Troponin < 0.30, 0.13.  He was given percocet and valium for  suspected chest wall pain.  He does note pain with movement.  No exertional CP. No dyspnea, orthopnea, PND, edema.  No syncope.  No palpitations.  He does feel tired.   Wt Readings from Last 3 Encounters:  09/23/11 204 lb (92.534 kg)  09/16/11 204 lb 8 oz (92.761 kg)  09/16/11 204 lb 8 oz (92.761 kg)     ALT  Date/Time Value Range Status  09/16/2011  8:34 AM 47  0 - 53 U/L Final  09/14/2011  5:45 AM 87* 0 - 53 U/L Final  09/12/2011  6:08 AM 156* 0 - 53 U/L Final  09/11/2011  5:27 AM 223* 0 - 53 U/L Final  09/10/2011  5:40 AM 303* 0 - 53 U/L Final    Past Medical History  Diagnosis Date  . Hypertension   . Diabetes mellitus   . Coronary artery disease   . Persistent atrial fibrillation   . Ischemic cardiomyopathy   . Epistaxis 08/2011    On heparin and Coumadin s/p silver nitrate cauterization L nare  . Hyperlipidemia   . Chronic combined systolic and diastolic CHF (congestive heart failure)     a) 07/13 echo: LVEF 25-30%, global hypokinesis but severe hypokinesis-akinesis of basal to mid inferior and posterior walls, features consistent with restrictive physiology, decreased LV diastolic compliance/increased LA pressures, mild LVH, normal RV size with moderately decreased systolic function and mild-moderate pHTN. b) TEE 08/13: LVEF 30-35%, diffuse hypokinesis, no vegetation or thrombus    Current Outpatient Prescriptions  Medication Sig Dispense Refill  . acetaminophen (TYLENOL) 500 MG tablet  Take 1,500 mg by mouth every 6 (six) hours as needed. For pain      . aspirin EC 81 MG EC tablet Take 1 tablet (81 mg total) by mouth daily.      . colchicine 0.6 MG tablet Take 0.6 mg by mouth daily as needed. For gout symptoms      . diazepam (VALIUM) 5 MG tablet Take 1 tablet (5 mg total) by mouth every 8 (eight) hours as needed (muscle spasm).  15 tablet  0  . furosemide (LASIX) 20 MG tablet Take 1 tablet (20 mg total) by mouth daily.  30 tablet  3  . glipiZIDE (GLUCOTROL) 5 MG tablet Take 5 mg  by mouth 2 (two) times daily before a meal.        . lisinopril (PRINIVIL,ZESTRIL) 20 MG tablet Take 40 mg by mouth daily.       . metFORMIN (GLUCOPHAGE) 500 MG tablet Take 1,000 mg by mouth 2 (two) times daily with a meal.        . metoprolol (LOPRESSOR) 50 MG tablet Take 50-100 mg by mouth 2 (two) times daily. 50 or 100 mg based on blood pressure      . nitroGLYCERIN (NITROSTAT) 0.4 MG SL tablet Place 1 tablet (0.4 mg total) under the tongue every 5 (five) minutes as needed for chest pain.  31 tablet  1  . oxyCODONE-acetaminophen (PERCOCET/ROXICET) 5-325 MG per tablet Take 2 tablets by mouth every 4 (four) hours as needed for pain.  20 tablet  0  . Rivaroxaban (XARELTO) 20 MG TABS Take 1 tablet (20 mg total) by mouth daily with supper.  30 tablet  3    Allergies: No Known Allergies  History  Substance Use Topics  . Smoking status: Former Smoker -- 0.5 packs/day for 2 years    Types: Cigarettes  . Smokeless tobacco: Never Used  . Alcohol Use: 3.0 oz/week    6 drink(s) per week     rarely     ROS:  Please see the history of present illness.   No melena, hematochezia, hematuria.  All other systems reviewed and negative.   PHYSICAL EXAM: VS:  BP 124/70  Pulse 135  Ht 6\' 4"  (1.93 m)  Wt 204 lb (92.534 kg)  BMI 24.83 kg/m2 Well nourished, well developed, in no acute distress HEENT: normal Neck: no JVD Cardiac:  normal S1, S2; irregularly irregular rhythm Lungs:  Decreased breath sounds bilaterally, no wheezing, rhonchi or rales Abd: soft, nontender, no hepatomegaly Ext: no edema; right groin without hematoma or bruit  Skin: warm and dry Neuro:  CNs 2-12 intact, no focal abnormalities noted  EKG:  AFib (? Atypical AFlutter with variable AV block), HR 144      ASSESSMENT AND PLAN:  1. Atrial Fibrillation He is back in rapid atrial fibrillation. I reviewed this with Dr. Excell Seltzer (DOD). The patient tells me that he has refrained from alcohol since discharge from hospital. His most  recent ALT was in the normal range. He has coronary artery disease. His best option for antiarrhythmic drug therapy his amiodarone. I will start him on amiodarone 400 mg twice daily. I will also increase his Lopressor to 75 mg twice a day for rate control. He will be brought back later this week for an ECG to ensure that his heart rate is better controlled. He will continue on Xarelto 20 mg a day. We will try to get this covered through his insurance company. If he remains in atrial fibrillation,  we will plan on proceeding with cardioversion approximately one week after initiation of amiodarone. Check a TSH and followup LFTs today.  2. Chronic Combined Systolic and Diastolic CHF Volume overall appears stable. Check a followup basic metabolic panel today. Continue current therapy.  3. Coronary Artery Disease Continue aspirin. Medical therapy is warranted for his small vessel disease.  4. Hypertension Controlled.  5. Hyperlipidemia Eventually try to initiate statin.  6. Alcohol Abuse He admits to abstinence. Check followup LFTs as noted.  7. Chest Pain Atypical symptoms. This does not seem to be anginal in nature. It seems to be more musculoskeletal. No further cardiac workup.  Signed, Tereso Newcomer, PA-C  2:47 PM 09/23/2011

## 2011-09-24 ENCOUNTER — Telehealth: Payer: Self-pay | Admitting: *Deleted

## 2011-09-24 ENCOUNTER — Encounter: Payer: Managed Care, Other (non HMO) | Admitting: Nurse Practitioner

## 2011-09-24 LAB — BASIC METABOLIC PANEL
Chloride: 102 mEq/L (ref 96–112)
GFR: 90.43 mL/min (ref >60.00)
Glucose, Bld: 190 mg/dL — ABNORMAL HIGH (ref 70–99)
Potassium: 5.1 mEq/L (ref 3.5–5.1)
Sodium: 139 mEq/L (ref 135–145)

## 2011-09-24 LAB — HEPATIC FUNCTION PANEL
Alkaline Phosphatase: 88 U/L (ref 39–117)
Bilirubin, Direct: 0.1 mg/dL (ref 0.0–0.3)
Total Protein: 8 g/dL (ref 6.0–8.3)

## 2011-09-24 LAB — TSH: TSH: 0.31 u[IU]/mL — ABNORMAL LOW (ref 0.35–5.50)

## 2011-09-24 NOTE — Telephone Encounter (Signed)
Message copied by Tarri Fuller on Wed Sep 24, 2011 12:57 PM ------      Message from: [place], [place] T      Created: Wed Sep 24, 2011 11:30 AM       Make sure he does not take any K+ supplements.  If he does let me know.      Sugar high - f/u with PCP.      TSH slightly low.        -  Repeat TSH with Free T4 and Free T3 when he comes in for follow up this week.      LFTs now normal.      Tereso Newcomer, PA-C  11:30 AM 09/24/2011

## 2011-09-24 NOTE — Telephone Encounter (Signed)
Pt notified today of lab results and that his K+ was 5.1 and the PA wanted to make sure he was not taking any K+ supp. pt states did not know for sure if taking K+ supp. I then asked if he was eating a lot of banana's. Pt then began to become belligerent with me about that he has been eating  some banana's. I advised the importance of K+ with the heart and kidney function. I then finished giving pt the lab results and when I advised that the PA recommended pt to f/u PCP for elevated glucose he began to say who was going to pay all these co-pay's. I told pt that we had this discussion yesterday when he was in the office seeing the PA. I told pt that it was still his choice if he chose to go his PCP and that it was just the recommendation from the PA. I told also that we will repeat TSH with FREE T4 AND FREE T3 when he comes in 09/25/11.

## 2011-09-24 NOTE — Telephone Encounter (Signed)
pt notified of lab results. when I asked if taking K+ supp, pt did not seem to know for sure if taking K+ supp, then I asked if eating alot of bananas, pt states a few bananas but started to get billergent with me. I finished giving lab results and advised pt that he f/u PCP for

## 2011-09-25 ENCOUNTER — Ambulatory Visit (INDEPENDENT_AMBULATORY_CARE_PROVIDER_SITE_OTHER): Payer: Managed Care, Other (non HMO) | Admitting: Physician Assistant

## 2011-09-25 ENCOUNTER — Encounter: Payer: Self-pay | Admitting: Physician Assistant

## 2011-09-25 VITALS — BP 108/78 | HR 90 | Ht 76.0 in | Wt 209.0 lb

## 2011-09-25 DIAGNOSIS — I10 Essential (primary) hypertension: Secondary | ICD-10-CM

## 2011-09-25 DIAGNOSIS — E785 Hyperlipidemia, unspecified: Secondary | ICD-10-CM

## 2011-09-25 DIAGNOSIS — R079 Chest pain, unspecified: Secondary | ICD-10-CM

## 2011-09-25 DIAGNOSIS — F1010 Alcohol abuse, uncomplicated: Secondary | ICD-10-CM

## 2011-09-25 DIAGNOSIS — I5042 Chronic combined systolic (congestive) and diastolic (congestive) heart failure: Secondary | ICD-10-CM

## 2011-09-25 DIAGNOSIS — I2510 Atherosclerotic heart disease of native coronary artery without angina pectoris: Secondary | ICD-10-CM

## 2011-09-25 DIAGNOSIS — E0590 Thyrotoxicosis, unspecified without thyrotoxic crisis or storm: Secondary | ICD-10-CM

## 2011-09-25 DIAGNOSIS — I4891 Unspecified atrial fibrillation: Secondary | ICD-10-CM

## 2011-09-25 LAB — T3, FREE: T3, Free: 2.5 pg/mL (ref 2.3–4.2)

## 2011-09-25 LAB — T4, FREE: Free T4: 0.87 ng/dL (ref 0.60–1.60)

## 2011-09-25 MED ORDER — AMIODARONE HCL 200 MG PO TABS: ORAL_TABLET | ORAL | Status: DC

## 2011-09-25 NOTE — Progress Notes (Signed)
[address] [place], [place]  [address] Phone: [phone] Fax:  [phone]  Date:  09/25/2011   Name:  Timothy Olsen   DOB:  Dec 21, 1955   MRN:  [id number]  PCP:  Lillia Carmel, MD  Primary Cardiologist:  Dr.  Shawnie Pons  Primary Electrophysiologist:  None    History of Present Illness: Timothy Olsen is a 56 y.o. male who returns for follow up.  He has a history of DM2, HTN, HL, reduced LV function and alcohol abuse. Admitted 7/29-8/6 with a non-STEMI and acute CHF in the setting of atrial fibrillation with RVR.  LHC demonstrated mainly distal and small vessel disease and med Rx was recommended.  Echo with EF 25-30%, severe diastolic dysfxn, mod BAE, PASP 50.  He was placed on Xarelto and underwent TEE-DCCV => NSR.  I saw him in follow up 2 days ago.  He was back in AFib with RVR with HR 140.  I placed him on Amiodarone 400 mg bid and increased Lopressor to 75 mg bid.  TSH was checked and was low at 0.31.  He is feeling better today.  Denies significant dyspnea.  No orthopnea, PND.  No edema.  No syncope.  No palpitations.  Still has left sided CP brought on by positional changes.  No exertional chest pain.    Wt Readings from Last 3 Encounters:  09/25/11 209 lb (94.802 kg)  09/23/11 204 lb (92.534 kg)  09/16/11 204 lb 8 oz (92.761 kg)     Past Medical History  Diagnosis Date  . Hypertension   . Diabetes mellitus   . CAD (coronary artery disease)     LHC 7/13: mOM1 50-75%, ostial subbranch 80-90% (very small), PL branch 80-90% (very small), mRCA 40-50%, EF 30% => med Rx for small vessel disease  . Atrial fibrillation     CHADS2=3; Xarelto started 7/13; s/p TEE-DCCV => NSR;  patient back in AF with RVR at f/u => amiodarone started  . Ischemic cardiomyopathy     echo 7/13: mild LVH, EF 25-30%, global HK with severe Hk to AK of basal to mid inf and post walls, severe diast dysfxn, mild MR, mod LAE, mod reduced RV fxn, mod RAE, PASP 50 (mild  to mod pulmo HTN);  TEE 7/13 with EF 30-35%  . Epistaxis 08/2011    On heparin and Coumadin s/p silver nitrate cauterization L nare  . Hyperlipidemia   . Chronic combined systolic and diastolic CHF (congestive heart failure)   . Gout   . Tobacco abuse   . Alcohol abuse     Current Outpatient Prescriptions  Medication Sig Dispense Refill  . acetaminophen (TYLENOL) 500 MG tablet Take 1,500 mg by mouth every 6 (six) hours as needed. For pain      . amiodarone (PACERONE) 200 MG tablet Take 2 tablets (400 mg total) by mouth 2 (two) times daily.  60 tablet  9  . aspirin EC 81 MG EC tablet Take 1 tablet (81 mg total) by mouth daily.      . colchicine 0.6 MG tablet Take 0.6 mg by mouth daily as needed. For gout symptoms      . furosemide (LASIX) 20 MG tablet Take 1 tablet (20 mg total) by mouth daily.  30 tablet  3  . glipiZIDE (GLUCOTROL) 5 MG tablet Take 5 mg by mouth 2 (two) times daily before a meal.        . lisinopril (PRINIVIL,ZESTRIL) 40 MG tablet Take 40  mg by mouth daily.      . metFORMIN (GLUCOPHAGE) 500 MG tablet Take 1,000 mg by mouth 2 (two) times daily with a meal.        . metoprolol (LOPRESSOR) 50 MG tablet Take 1.5 tablets (75 mg total) by mouth 2 (two) times daily.  60 tablet  9  . nitroGLYCERIN (NITROSTAT) 0.4 MG SL tablet Place 1 tablet (0.4 mg total) under the tongue every 5 (five) minutes as needed for chest pain.  31 tablet  1  . Rivaroxaban (XARELTO) 20 MG TABS Take 1 tablet (20 mg total) by mouth daily with supper.  30 tablet  3    Allergies: No Known Allergies  History  Substance Use Topics  . Smoking status: Former Smoker -- 0.5 packs/day for 2 years    Types: Cigarettes  . Smokeless tobacco: Never Used  . Alcohol Use: 3.0 oz/week    6 drink(s) per week     rarely     ROS:  Please see the history of present illness.   All other systems reviewed and negative.   PHYSICAL EXAM: VS:  BP 108/78  Pulse 90  Ht 6\' 4"  (1.93 m)  Wt 209 lb (94.802 kg)  BMI 25.44  kg/m2 Well nourished, well developed, in no acute distress HEENT: normal Neck: no JVD at 90 Cardiac:  normal S1, S2; irregularly irregular rhythm Lungs:  Decreased breath sounds bilaterally, no wheezing, rhonchi or rales Abd: soft, nontender, no hepatomegaly Ext: no edema  Skin: warm and dry Neuro:  CNs 2-12 intact, no focal abnormalities noted  EKG:  Atrial fibrillation, heart rate 90      ASSESSMENT AND PLAN:  1. Atrial Fibrillation Rate is much better controlled. Continue amiodarone. Discussed with Dr. Tenny Craw (DOD). He will continue amiodarone 400 mg twice a day for 10 days. He will then reduce to 200 mg twice a day. We will bring him back in followup in 3 weeks. If he remains in atrial fibrillation at that time, consider proceeding with cardioversion. I explained to him the importance of taking Xarelto on a daily basis and he understands this.  2. Chronic Combined Systolic and Diastolic CHF Volume overall appears stable. Continue current therapy.  3. Coronary Artery Disease Continue aspirin. Medical therapy is warranted for his small vessel disease.  4. Hypertension Controlled.  5. Hyperlipidemia Eventually try to initiate statin.  6. Alcohol Abuse He admits to abstinence. Recent LFTs normal  7. Chest Pain Atypical symptoms. This does not seem to be anginal in nature. It seems to be more musculoskeletal. No further cardiac workup.  8. Hyperthyroidism Recent TSH marginally low. Repeat TSH and free T4, free T3 today. If he is truly hyperthyroid, he will need referral to endocrinology.  Signed, Tereso Newcomer, PA-C  3:13 PM 09/25/2011

## 2011-09-25 NOTE — Patient Instructions (Addendum)
3 WEEK FOLLOW WITH DR. Riley Kill OR SCOTT WEAVER, PAC SAME DAY DR. Riley Kill IS IN THE OFFICE  Your physician has recommended you make the following change in your medication: STAY ON AMIODARONE 400 MG TWICE DAILY FOR 10 DAYS TOTAL STARTING FROM Tuesday 09/23/11; AFTER THE 10 DAYS THEN DECREASE TO 200 MG TWICE DAILY  DO NOT MISS ANY DOSES OF XARELTO   Your physician recommends that you return for lab work in: TODAY TSH, FREE T3, FREE T4

## 2011-10-06 ENCOUNTER — Telehealth: Payer: Self-pay | Admitting: *Deleted

## 2011-10-06 NOTE — Telephone Encounter (Signed)
pt aware of lab results and gave me verbal understanding today, will fax results to Dr. Prince Rome today

## 2011-10-06 NOTE — Telephone Encounter (Signed)
Message copied by Tarri Fuller on Mon Oct 06, 2011 11:08 AM ------      Message from: [place], [place] T      Created: Sun Oct 05, 2011  7:37 AM       TSH slightly low but T4 and T3 normal      Have him follow up with Lillia Carmel, MD to follow thyroid and fax labs to Lillia Carmel, MD       Tereso Newcomer, PA-C  7:37 AM 10/05/2011

## 2011-10-16 ENCOUNTER — Encounter: Payer: Self-pay | Admitting: Physician Assistant

## 2011-10-16 ENCOUNTER — Ambulatory Visit (INDEPENDENT_AMBULATORY_CARE_PROVIDER_SITE_OTHER): Payer: Managed Care, Other (non HMO) | Admitting: Physician Assistant

## 2011-10-16 VITALS — BP 174/65 | HR 43 | Ht 76.0 in | Wt 211.8 lb

## 2011-10-16 DIAGNOSIS — I4891 Unspecified atrial fibrillation: Secondary | ICD-10-CM

## 2011-10-16 DIAGNOSIS — R079 Chest pain, unspecified: Secondary | ICD-10-CM

## 2011-10-16 DIAGNOSIS — I5042 Chronic combined systolic (congestive) and diastolic (congestive) heart failure: Secondary | ICD-10-CM

## 2011-10-16 DIAGNOSIS — I2510 Atherosclerotic heart disease of native coronary artery without angina pectoris: Secondary | ICD-10-CM

## 2011-10-16 LAB — BASIC METABOLIC PANEL
Chloride: 105 mEq/L (ref 96–112)
Creatinine, Ser: 1 mg/dL (ref 0.4–1.5)
Potassium: 4 mEq/L (ref 3.5–5.1)

## 2011-10-16 MED ORDER — ISOSORBIDE MONONITRATE ER 30 MG PO TB24: 30.0000 mg | ORAL_TABLET | Freq: Every day | ORAL | Status: DC

## 2011-10-16 MED ORDER — METOPROLOL TARTRATE 50 MG PO TABS: 50.0000 mg | ORAL_TABLET | Freq: Two times a day (BID) | ORAL | Status: DC

## 2011-10-16 MED ORDER — AMIODARONE HCL 200 MG PO TABS: ORAL_TABLET | ORAL | Status: DC

## 2011-10-16 NOTE — Progress Notes (Signed)
[address] [place], [place]  [address] Phone: [phone] Fax:  [phone]  Date:  10/16/2011   Name:  Timothy Olsen   DOB:  May 29, 1955   MRN:  [id number]  PCP:  Lillia Carmel, MD  Primary Cardiologist:  Dr.  Shawnie Pons  Primary Electrophysiologist:  None    History of Present Illness: Timothy Olsen is a 56 y.o. male who returns for follow up.  He has a history of CAD, atrial fibrillation, DM2, HTN, HL, reduced LV function and alcohol abuse. Admitted 08/2011 with a non-STEMI and acute systolic CHF in the setting of AFib with RVR.  LHC demonstrated mainly distal and small vessel disease and med Rx was recommended.  EF 25-30% by echo.  He was placed on Xarelto and underwent TEE-DCCV => NSR.  I saw him recently in follow up.  He was noted to be back in atrial fibrillation.  I placed him on amiodarone and also increased his dose of Lopressor. When last seen on 09/25/11, his heart rate was much better controlled. He remained on Xarelto.  He is back in NSR today.  Overall feels better.  He continues to complain of left sided chest pain.  He is somewhat of a poor historian. He tells me this pain has been coming and going since 11/2010.  It seems to be worse with positional changes.  No clear exertional component.  He is not sure that he is short of breath.  No orthopnea, PND, edema.  No syncope.  No palpitations.  Not really clear if he has pleuritic pains.  He has taken Valium, Percocet and tylenol with relief.  He reports compliance with Xarelto.    Wt Readings from Last 3 Encounters:  10/16/11 211 lb 12.8 oz (96.072 kg)  09/25/11 209 lb (94.802 kg)  09/23/11 204 lb (92.534 kg)     Past Medical History  Diagnosis Date  . Hypertension   . Diabetes mellitus   . CAD (coronary artery disease)     LHC 7/13: mOM1 50-75%, ostial subbranch 80-90% (very small), PL branch 80-90% (very small), mRCA 40-50%, EF 30% => med Rx for small vessel disease  . Atrial  fibrillation     CHADS2=3; Xarelto started 7/13; s/p TEE-DCCV => NSR;  patient back in AF with RVR at f/u 8/13 => amiodarone started => back in NSR 9/13 f/u OV  . Ischemic cardiomyopathy     echo 7/13: mild LVH, EF 25-30%, global HK with severe Hk to AK of basal to mid inf and post walls, severe diast dysfxn, mild MR, mod LAE, mod reduced RV fxn, mod RAE, PASP 50 (mild to mod pulmo HTN);  TEE 7/13 with EF 30-35%  . Epistaxis 08/2011    On heparin and Coumadin s/p silver nitrate cauterization L nare  . Hyperlipidemia   . Chronic combined systolic and diastolic CHF (congestive heart failure)   . Gout   . Tobacco abuse   . Alcohol abuse     Current Outpatient Prescriptions  Medication Sig Dispense Refill  . acetaminophen (TYLENOL) 500 MG tablet Take 1,500 mg by mouth every 6 (six) hours as needed. For pain      . amiodarone (PACERONE) 200 MG tablet TAKE 400 MG TWICE DAILY FOR 10 DAYS THEN AFTER 10 DAYS DECREASE TO 200 MG TWICE DAILY      . aspirin EC 81 MG EC tablet Take 1 tablet (81 mg total) by mouth daily.      . colchicine  0.6 MG tablet Take 0.6 mg by mouth daily as needed. For gout symptoms      . folic acid (FOLVITE) 1 MG tablet Take 1 mg by mouth daily.      . furosemide (LASIX) 20 MG tablet Take 1 tablet (20 mg total) by mouth daily.  30 tablet  3  . glipiZIDE (GLUCOTROL) 5 MG tablet Take 5 mg by mouth 2 (two) times daily before a meal.        . lisinopril (PRINIVIL,ZESTRIL) 40 MG tablet Take 40 mg by mouth daily.      . metFORMIN (GLUCOPHAGE) 500 MG tablet Take 1,000 mg by mouth 2 (two) times daily with a meal.        . metoprolol (LOPRESSOR) 50 MG tablet Take 1.5 tablets (75 mg total) by mouth 2 (two) times daily.  60 tablet  9  . nitroGLYCERIN (NITROSTAT) 0.4 MG SL tablet Place 1 tablet (0.4 mg total) under the tongue every 5 (five) minutes as needed for chest pain.  31 tablet  1  . Rivaroxaban (XARELTO) 20 MG TABS Take 1 tablet (20 mg total) by mouth daily with supper.  30 tablet   3    Allergies: No Known Allergies  History  Substance Use Topics  . Smoking status: Former Smoker -- 0.5 packs/day for 2 years    Types: Cigarettes  . Smokeless tobacco: Never Used  . Alcohol Use: 3.0 oz/week    6 drink(s) per week     rarely     ROS:  Please see the history of present illness.  No fevers, chills, cough.  No melena or hematochezia.  He feels tired.   All other systems reviewed and negative.   PHYSICAL EXAM: VS:  BP 174/65  Pulse 43  Ht 6\' 4"  (1.93 m)  Wt 211 lb 12.8 oz (96.072 kg)  BMI 25.78 kg/m2 Well nourished, well developed, in no acute distress HEENT: normal Neck: no JVD  Cardiac:  normal S1, S2;RRR, no murmur  Chest:  No tenderness to palpation  Lungs:  Clear to auscultation bilaterally, no wheezing, rhonchi or rales Abd: soft, nontender, no hepatomegaly Ext: no edema  Skin: warm and dry Neuro:  CNs 2-12 intact, no focal abnormalities noted  EKG:  Sinus bradycardia, rate 49, normal axis, T-wave inversions in 1, aVL, V6; no significant change since prior tracing    ASSESSMENT AND PLAN:  1. Atrial Fibrillation:  He has returned to NSR.  He continues on Xarelto.  He understands the importance of taking this.  His rate is now slow.  He has already decreased his amiodarone to 200 mg QD.  Continue this.  Decrease Metoprolol to 50 mg bid.  Follow up with Dr.  Shawnie Pons in 4-6 weeks.  Plan on checking CBC and LFTs at that visit.  Of note, thyroid fxn is followed by his PCP.    2. Chronic Combined Systolic and Diastolic CHF:  Volume is stable.  Check follow up BMET today.  LV dysfxn may be (at least in part) tachycardia mediated.  Also has component of ischemic cardiomyopathy.  He will eventually need a follow up echo to recheck LVF.  This can be arranged at his next visit with Dr. Riley Kill.  Add Isosorbide as discussed below.  Consider adding Hydralazine at next OV.   3. Coronary Artery Disease:  Stable.  Continue ASA.  Statin Rx avoided in past due to  elevated LFTs in setting of alcohol abuse.  Recent LFTs normalized.  He is  somewhat resistant to adding more medications.  Try to add statin at next visit.    4. Hypertension:  Uncontrolled.  Add Imdur as noted below.   5. Hyperlipidemia:  Eventually try to add statin Rx.  6. Chest Pain:  Atypical.  He is quite frustrated by his CP.  He has had these symptoms since 11/2010.  Overall, sounds MSK.  Will try to add Imdur 30 mg QD to see if this helps as he notes he had these pains when he was told he had a MI in the past.  He is on Xarelto and has been on this for almost 6 weeks.  Do not think Chest CT angio would be helpful and treatment of pulmonary embolism, at this point, would be the same.  I have also asked him to continue follow up with his PCP as chest wall pain will need to be managed by him.  If pain is not improved by Imdur, could consider Chest CT to rule out other causes.  Of note, CXR has been unremarkable in the past.    7. Low TSH:  Follow by his PCP.  8. Disposition:  I spent 30 minutes with the patient today discussing his care, his symptoms and what his treatment and future evaluation will include.  I am also completing his FMLA forms.    Signed, Tereso Newcomer, PA-C  2:19 PM 10/16/2011

## 2011-10-16 NOTE — Patient Instructions (Addendum)
Your physician has recommended you make the following change in your medication: TAKE ASPIRIN (81 MG) DAILY, AMIODARONE (200 MG) DAILY, DECREASE LOPRESSOR (50 MG) TWICE A DAY, START IMDUR (30 MG) DAILY, CALLED IMDUR IN TO YOUR PHARMACY, [facility]    Your physician recommends that you HAVE lab work TODAY: BMET   Your physician recommends that you return for lab work: CBC, LFTS,  WHEN YOU HAVE YOUR APPOINTMENT WITH DR. Riley Kill IN 4-6 WEEKS   Your physician recommends that you schedule a follow-up appointment WITH DR. Riley Kill IN 4-6 WEEKS

## 2011-10-17 ENCOUNTER — Telehealth: Payer: Self-pay | Admitting: *Deleted

## 2011-10-17 NOTE — Telephone Encounter (Signed)
pt notified of lab results and is coming by today to pick up his disability paper work. I faxed over to [facility] today for him as well.

## 2011-10-17 NOTE — Telephone Encounter (Signed)
Message copied by Tarri Fuller on Fri Oct 17, 2011  3:08 PM ------      Message from: [place], [place] T      Created: Thu Oct 16, 2011  5:33 PM       [facility]      Continue with current treatment plan.      Tereso Newcomer, PA-C  5:33 PM 10/16/2011

## 2011-11-08 ENCOUNTER — Emergency Department (HOSPITAL_COMMUNITY)
Admission: EM | Admit: 2011-11-08 | Discharge: 2011-11-08 | Disposition: A | Discharge status: Home / Self Care | Payer: Managed Care, Other (non HMO) | Attending: Emergency Medicine | Admitting: Emergency Medicine

## 2011-11-08 ENCOUNTER — Emergency Department (HOSPITAL_COMMUNITY): Payer: Managed Care, Other (non HMO)

## 2011-11-08 ENCOUNTER — Encounter (HOSPITAL_COMMUNITY): Payer: Self-pay | Admitting: Emergency Medicine

## 2011-11-08 DIAGNOSIS — R079 Chest pain, unspecified: Secondary | ICD-10-CM | POA: Insufficient documentation

## 2011-11-08 DIAGNOSIS — E119 Type 2 diabetes mellitus without complications: Secondary | ICD-10-CM | POA: Insufficient documentation

## 2011-11-08 DIAGNOSIS — Z79899 Other long term (current) drug therapy: Secondary | ICD-10-CM | POA: Insufficient documentation

## 2011-11-08 DIAGNOSIS — Z87891 Personal history of nicotine dependence: Secondary | ICD-10-CM | POA: Insufficient documentation

## 2011-11-08 DIAGNOSIS — I10 Essential (primary) hypertension: Secondary | ICD-10-CM | POA: Insufficient documentation

## 2011-11-08 DIAGNOSIS — M7989 Other specified soft tissue disorders: Secondary | ICD-10-CM | POA: Insufficient documentation

## 2011-11-08 DIAGNOSIS — I252 Old myocardial infarction: Secondary | ICD-10-CM | POA: Insufficient documentation

## 2011-11-08 DIAGNOSIS — I2510 Atherosclerotic heart disease of native coronary artery without angina pectoris: Secondary | ICD-10-CM | POA: Insufficient documentation

## 2011-11-08 HISTORY — DX: Acute myocardial infarction, unspecified: I21.9

## 2011-11-08 LAB — POCT I-STAT TROPONIN I: Troponin i, poc: 0.03 ng/mL (ref 0.00–0.08)

## 2011-11-08 LAB — CBC WITH DIFFERENTIAL/PLATELET
Basophils Absolute: 0 10*3/uL (ref 0.0–0.1)
Basophils Relative: 0 % (ref 0–1)
Eosinophils Absolute: 0.1 10*3/uL (ref 0.0–0.7)
Eosinophils Relative: 2 % (ref 0–5)
Lymphocytes Relative: 40 % (ref 12–46)
MCV: 86.6 fL (ref 78.0–100.0)
Platelets: 174 10*3/uL (ref 150–400)
RDW: 14.8 % (ref 11.5–15.5)
WBC: 6.5 10*3/uL (ref 4.0–10.5)

## 2011-11-08 LAB — BASIC METABOLIC PANEL
CO2: 30 mEq/L (ref 19–32)
Calcium: 10.1 mg/dL (ref 8.4–10.5)
GFR calc Af Amer: >90 mL/min (ref >90)
GFR calc non Af Amer: >90 mL/min (ref >90)
Sodium: 140 mEq/L (ref 135–145)

## 2011-11-08 MED ORDER — AMLODIPINE BESYLATE 5 MG PO TABS: 5.0000 mg | ORAL_TABLET | Freq: Every day | ORAL | Status: DC

## 2011-11-08 MED ORDER — METOPROLOL TARTRATE 50 MG PO TABS: 25.0000 mg | ORAL_TABLET | Freq: Two times a day (BID) | ORAL | Status: DC

## 2011-11-08 MED ORDER — AMIODARONE HCL 200 MG PO TABS: 200.0000 mg | ORAL_TABLET | Freq: Every day | ORAL | Status: DC

## 2011-11-08 MED ORDER — OXYCODONE-ACETAMINOPHEN 5-325 MG PO TABS: 2.0000 | ORAL_TABLET | ORAL | Status: DC | PRN

## 2011-11-08 NOTE — Consult Note (Signed)
CARDIOLOGY CONSULT NOTE  Patient ID: Timothy Olsen  MRN: [id number]  DOB: 06/19/1955  Admit date: 11/08/2011 Date of Consult: 11/08/2011  Primary Physician: Lillia Carmel, MD Primary Cardiologist: Dr.  Shawnie Pons  Primary Electrophysiologist:  None   Reason for Consultation: Chest Pain  History of Present Illness: Timothy Olsen is a 56 y.o. male who presents to the emergency room for evaluation of chest pain.  He has a history of CAD, atrial fibrillation, DM2, HTN, HL, reduced LV function and alcohol abuse. Admitted 08/2011 with a non-STEMI and acute systolic CHF in the setting of AFib with RVR. LHC demonstrated mainly distal and small vessel disease and med Rx was recommended. EF 25-30% by echo. He was placed on Xarelto and underwent TEE-DCCV => NSR.  In followup, he was noted to be back in atrial fibrillation with RVR.  He was placed him on amiodarone.  He subsequently returned to normal sinus rhythm. He did have some bradycardia with adjustments made in his beta blocker therapy and dose of amiodarone. He has continued to have left-sided chest pain that is not exertional. It is worse with positional changes. He presents to the emergency room today with the same discomfort. There have been no changes. He gets it with changes in positioning. He denies exertional chest discomfort or exertional shortness of breath. He denies orthopnea, PND or increasing pedal edema. He denies syncope. He denies pleuritic chest pain. He denies chest pain with lying supine. Of note, the patient has presented to the hospital several times with the same chest pain. Prior to his heart catheterization in 7/13, he had been to the hospital on at least a couple of occasions with this discomfort. At those times, he did rule in for a non-STEMI. The patient refused cardiac catheterization at those times. He is concerned because he continues to have the same chest pain that he was told when he had a heart attack.  Of  note, the patient recently discontinued his amiodarone upon the recommendation of his primary care provider due to bradycardia (40s). He took a half a tablet recently because his heart rate was up into the 60s.  Past Medical History  Diagnosis Date  . Hypertension   . Diabetes mellitus   . CAD (coronary artery disease)     LHC 7/13: mOM1 50-75%, ostial subbranch 80-90% (very small), PL branch 80-90% (very small), mRCA 40-50%, EF 30% => med Rx for small vessel disease  . Atrial fibrillation     CHADS2=3; Xarelto started 7/13; s/p TEE-DCCV => NSR;  patient back in AF with RVR at f/u 8/13 => amiodarone started => back in NSR 9/13 f/u OV  . Ischemic cardiomyopathy     echo 7/13: mild LVH, EF 25-30%, global HK with severe Hk to AK of basal to mid inf and post walls, severe diast dysfxn, mild MR, mod LAE, mod reduced RV fxn, mod RAE, PASP 50 (mild to mod pulmo HTN);  TEE 7/13 with EF 30-35%  . Epistaxis 08/2011    On heparin and Coumadin s/p silver nitrate cauterization L nare  . Hyperlipidemia   . Chronic combined systolic and diastolic CHF (congestive heart failure)   . Gout   . Tobacco abuse   . Alcohol abuse   . MI (myocardial infarction)      Past Surgical History  Procedure Date  . Tonsillectomy   . Left thumb surgery   . Adenoidectomy   . Tee without cardioversion 09/15/2011    Procedure: TRANSESOPHAGEAL ECHOCARDIOGRAM (  TEE);  Surgeon: Lewayne Bunting, MD;  Location[facility] ENDOSCOPY;  Service: Cardiovascular;  Laterality: N/A;  . Cardioversion 09/15/2011    Procedure: CARDIOVERSION;  Surgeon: Lewayne Bunting, MD;  Location: [facility] ENDOSCOPY;  Service: Cardiovascular;  Laterality: N/A;  . Cardiac catheterization 09/09/2011    a) widely patent left main with minor luminal irregularities, patent LAD, mild prox LAD calcification and luminal irregularities, patent D1, diffuse prox and LCX- mid irregularity, 50-75% mid OM1 stenosis, 80-90% ostial stenosis in small subbranch, small PLB 80-90%  stenosis, RCA- mild proximal luminal irregularity, mid 40-50% stenosis, distal irregularity, PDA and PLB patent; LVEF 30% and global L      Home Meds: Prior to Admission medications   Medication Sig Start Date End Date Taking? Authorizing Provider  amiodarone (PACERONE) 200 MG tablet Take 50-200 mg by mouth 2 (two) times daily as needed. For tachycardia   Yes Historical Provider, MD  aspirin EC 81 MG tablet Take 81 mg by mouth daily.   Yes Historical Provider, MD  colchicine 0.6 MG tablet Take 0.6 mg by mouth daily as needed. For gout symptoms   Yes Historical Provider, MD  glipiZIDE (GLUCOTROL) 5 MG tablet Take 5 mg by mouth 2 (two) times daily before a meal.     Yes Historical Provider, MD  isosorbide mononitrate (IMDUR) 30 MG 24 hr tablet Take 30 mg by mouth daily.   Yes Historical Provider, MD  lisinopril (PRINIVIL,ZESTRIL) 40 MG tablet Take 40 mg by mouth daily.   Yes Historical Provider, MD  metFORMIN (GLUCOPHAGE) 500 MG tablet Take 500 mg by mouth 2 (two) times daily with a meal. May take 250mg  twice daily if blood sugar is already low   Yes Historical Provider, MD  metoprolol (LOPRESSOR) 50 MG tablet Take 50 mg by mouth 2 (two) times daily.   Yes Historical Provider, MD  nitroGLYCERIN (NITROSTAT) 0.4 MG SL tablet Place 0.4 mg under the tongue every 5 (five) minutes as needed. For chest pain   Yes Historical Provider, MD  Rivaroxaban (XARELTO) 20 MG TABS Take 20 mg by mouth daily with supper.   Yes Historical Provider, MD     Allergies: No Known Allergies   History  Substance Use Topics  . Smoking status: Former Smoker -- 0.5 packs/day for 2 years    Types: Cigarettes  . Smokeless tobacco: Never Used  . Alcohol Use: 3.0 oz/week    6 drink(s) per week     rarely      Family History  Problem Relation Age of Onset  . Diabetes type II Mother   . Hypertension Father       ROS:  Please see the history of present illness.     All other systems reviewed and negative.     Vital Signs: Blood pressure 193/79, pulse 44, temperature 98.5 F (36.9 C), resp. rate 16, SpO2 100.00%.   PHYSICAL EXAM: General:  Well nourished, well developed, in no acute distress HEENT: normal Lymph: no adenopathy Neck: no JVD Endocrine:  No thryomegaly Vascular: Distal pulses intact Cardiac:  normal S1, S2; RRR; no murmur Lungs:  clear to auscultation bilaterally, no wheezing, rhonchi or rales Abd: soft, nontender, no hepatomegaly Ext: no edema Musculoskeletal:  No deformities, BUE and BLE strength normal and equal Skin: warm and dry Neuro:  CNs 2-12 intact, no focal abnormalities noted Psych:  Normal affect    EKG:  Sinus bradycardia, heart rate 46, LVH, T-wave inversions in 1, aVL, 2, 3, aVF, V5-V6. No significant change  when compared to prior tracings.  Labs:  No results found for this basename: CKTOTAL:4,CKMB:4,TROPONINI:4 in the last 72 hours   Lab Results  Component Value Date   WBC 6.5 11/08/2011   HGB 13.7 11/08/2011   HCT 41.9 11/08/2011   MCV 86.6 11/08/2011   PLT 174 11/08/2011    Lab 11/08/11 1255  NA 140  K 4.3  CL 102  CO2 30  BUN 14  CREATININE 0.92  CALCIUM 10.1  PROT --  BILITOT --  ALKPHOS --  ALT --  AST --  GLUCOSE 71   Lab Results  Component Value Date   CHOL 155 09/09/2011   HDL 41 09/09/2011   LDLCALC 99 09/09/2011   TRIG 76 09/09/2011    Radiology/Studies:  Dg Chest 2 View 11/08/2011    IMPRESSION:  1.  No acute cardiopulmonary disease. 2.  Unchanged borderline cardiomegaly. 3.  Mild pulmonary hyperinflation.   Original Report Authenticated By: HEATH     ASSESSMENT AND PLAN:  1.  Chest Pain:   Atypical. This does not appear to be cardiac. He can clearly reproduce it with positional changes. He can be discharged to home and followup in the office as previously planned.  2. Coronary Artery Disease: He can stop his aspirin as he is on Xarelto. Followup with Dr. Riley Kill as planned.  3. Hypertension: Uncontrolled. His  metoprolol be adjusted for bradycardia. Add amlodipine 5 mg daily.  4. Atrial Fibrillation: He is maintaining sinus rhythm. He does have sinus bradycardia. He does not appear to be symptomatic. Recommend he remain on amiodarone to keep him in sinus rhythm. Suspect his cardiomyopathy is at least in part related to tachycardia. Continue Xarelto. Continue amiodarone 200 mg daily. Reduce metoprolol to 25 mg twice daily due to bradycardia.  5. Chronic Combined Systolic and Diastolic CHF: Volume stable. Continue current therapy.  Signed, Tereso Newcomer, PA-C 11/08/2011, 3:51 PM   As above, patient seen and examined. Briefly 56 year old male with past medical history of coronary disease, atrial fibrillation, hypertension, cardiomyopathy with chest pain. Patient underwent cardiac catheterization in July of 2013 and was noted to havea 50-75 first obtuse marginal, a very small ostial side branch with an 80-90% lesion and an 80-90% very small posterior lateral branch. Medical therapy recommended. He has had previous TEE guided cardioversion for atrial fibrillation. He has had problems with recurrent chest pain. The pain is in the left lateral chest wall/axillary area. It occurs without warning and lasts 1-2 seconds and is described as a grabbing sensation. It increases with certain positions. He does not have exertional chest pain. He also denies dyspnea. His chest pain is reproduced with palpation today. His electrocardiogram shows sinus bradycardia at a rate of 46. There is inferior lateral T-wave inversion which is slightly more prominent compared to previous. However in reviewing his electrocardiograms he has had some degree of inferolateral T-wave inversion in the past. His chest pain is most consistent with musculoskeletal pain. I do not think it is cardiac. His cardiac marker is negative. I think he can be discharged from a cardiac standpoint and followup as scheduled. He is somewhat bradycardic and I would  recommend continuing amiodarone at 200 mg daily but decrease metoprolol to 25 mg by mouth twice a day. His blood pressure is also mildly elevated. Because we are decreasing his metoprolol I will add Norvasc 5 mg daily. Plans are for repeat echocardiogram 3 months after sinus rhythm has been established. His previous cardiomyopathy was felt to be tachycardia mediated.  Hopefully his LV function will have improved. Olga Millers 4:12 PM

## 2011-11-08 NOTE — ED Notes (Signed)
Went over in detail discharge papers with pt. Pt verbalized understanding to fill rx as soon as he is discharged and to take medications already rx to him as instructed. RN needed to go over instructions several time with pt for clear understanding.

## 2011-11-08 NOTE — ED Notes (Signed)
Pt c/o left sided chest pain onset yesterday. Pain symptoms same as previous heart attacks. Skin cool, dry.

## 2011-11-08 NOTE — ED Provider Notes (Addendum)
Medical screening examination/treatment/procedure(s) were conducted as a shared visit with non-physician practitioner(s) and myself.  I personally evaluated the patient during the encounter 46 y male with spasmodic left cp. No other sxs.  Hx of ami.  Seen by cards in ed.  They do not think sxs due to acs.  Cheri Guppy, MD 11/08/11 1914  Cheri Guppy, MD 11/08/11 [phone]

## 2011-11-08 NOTE — ED Provider Notes (Signed)
History     CSN: [id number]  Arrival date & time 11/08/11  1244   First MD Initiated Contact with Patient 11/08/11 1410      Chief Complaint  Patient presents with  . Chest Pain    (Consider location/radiation/quality/duration/timing/severity/associated sxs/prior treatment) HPI Comments: 56 y/o male with hx of MI in July presents to ED with continuing intermittent chest pain since the beginning of the month. Nothing in specific brings the chest pain on, it is completely random. Chest pain worsened to 8/10 last night before bed. He took a percocet and nitro with relief of his pain until this morning. The pain has been intermittent since he woke up. Pain located under his left pectoral region and is non radiating, rated 8/10. States pain feels similar to his previous MI in July but without associated nausea and diaphoresis. He has had a normal appetite. Admits to ankle swelling after being on his feet for a while. States he has not been taking his water pill as directed. Saw cardiologist for chest pain on 9/6 who gave him isosorbide to see if it would help the pain which it has not. Denies headache, lightheadedness, visual disturbance, dizziness, weakness, n/v, sob, cough.   The history is provided by the patient.    Past Medical History  Diagnosis Date  . Hypertension   . Diabetes mellitus   . CAD (coronary artery disease)     LHC 7/13: mOM1 50-75%, ostial subbranch 80-90% (very small), PL branch 80-90% (very small), mRCA 40-50%, EF 30% => med Rx for small vessel disease  . Atrial fibrillation     CHADS2=3; Xarelto started 7/13; s/p TEE-DCCV => NSR;  patient back in AF with RVR at f/u 8/13 => amiodarone started => back in NSR 9/13 f/u OV  . Ischemic cardiomyopathy     echo 7/13: mild LVH, EF 25-30%, global HK with severe Hk to AK of basal to mid inf and post walls, severe diast dysfxn, mild MR, mod LAE, mod reduced RV fxn, mod RAE, PASP 50 (mild to mod pulmo HTN);  TEE 7/13 with EF 30-35%   . Epistaxis 08/2011    On heparin and Coumadin s/p silver nitrate cauterization L nare  . Hyperlipidemia   . Chronic combined systolic and diastolic CHF (congestive heart failure)   . Gout   . Tobacco abuse   . Alcohol abuse   . MI (myocardial infarction)     Past Surgical History  Procedure Date  . Tonsillectomy   . Left thumb surgery   . Adenoidectomy   . Tee without cardioversion 09/15/2011    Procedure: TRANSESOPHAGEAL ECHOCARDIOGRAM (TEE);  Surgeon: Lewayne Bunting, MD;  Location: [facility] ENDOSCOPY;  Service: Cardiovascular;  Laterality: N/A;  . Cardioversion 09/15/2011    Procedure: CARDIOVERSION;  Surgeon: Lewayne Bunting, MD;  Location: [facility] ENDOSCOPY;  Service: Cardiovascular;  Laterality: N/A;  . Cardiac catheterization 09/09/2011    a) widely patent left main with minor luminal irregularities, patent LAD, mild prox LAD calcification and luminal irregularities, patent D1, diffuse prox and LCX- mid irregularity, 50-75% mid OM1 stenosis, 80-90% ostial stenosis in small subbranch, small PLB 80-90% stenosis, RCA- mild proximal luminal irregularity, mid 40-50% stenosis, distal irregularity, PDA and PLB patent; LVEF 30% and global L    Family History  Problem Relation Age of Onset  . Diabetes type II Mother   . Hypertension Father     History  Substance Use Topics  . Smoking status: Former Smoker -- 0.5 packs/day for 2  years    Types: Cigarettes  . Smokeless tobacco: Never Used  . Alcohol Use: 3.0 oz/week    6 drink(s) per week     rarely      Review of Systems  Constitutional: Negative for fever, chills, diaphoresis, activity change and appetite change.  HENT: Negative for neck pain.   Eyes: Negative for visual disturbance.  Respiratory: Negative for cough and shortness of breath.   Cardiovascular: Positive for chest pain and leg swelling.  Gastrointestinal: Negative for nausea and vomiting.  Musculoskeletal: Negative for back pain.  Skin: Negative for color change and  rash.  Neurological: Negative for dizziness, weakness and light-headedness.  Psychiatric/Behavioral: Negative for confusion.    Allergies  Review of patient's allergies indicates no known allergies.  Home Medications   Current Outpatient Rx  Name Route Sig Dispense Refill  . ACETAMINOPHEN 500 MG PO TABS Oral Take 1,500 mg by mouth every 6 (six) hours as needed. For pain    . AMIODARONE HCL 200 MG PO TABS  Take one tablet (200 mg ) daily    . ASPIRIN 81 MG PO TBEC Oral Take 1 tablet (81 mg total) by mouth daily.    . COLCHICINE 0.6 MG PO TABS Oral Take 0.6 mg by mouth daily as needed. For gout symptoms    . FOLIC ACID 1 MG PO TABS Oral Take 1 mg by mouth daily.    . FUROSEMIDE 20 MG PO TABS Oral Take 1 tablet (20 mg total) by mouth daily. 30 tablet 3  . GLIPIZIDE 5 MG PO TABS Oral Take 5 mg by mouth 2 (two) times daily before a meal.      . ISOSORBIDE MONONITRATE ER 30 MG PO TB24 Oral Take 1 tablet (30 mg total) by mouth daily. 30 tablet 11  . LISINOPRIL 40 MG PO TABS Oral Take 40 mg by mouth daily.    Marland Kitchen METFORMIN HCL 500 MG PO TABS Oral Take 1,000 mg by mouth 2 (two) times daily with a meal.      . METOPROLOL TARTRATE 50 MG PO TABS Oral Take 1 tablet (50 mg total) by mouth 2 (two) times daily. 60 tablet 9  . NITROGLYCERIN 0.4 MG SL SUBL Sublingual Place 1 tablet (0.4 mg total) under the tongue every 5 (five) minutes as needed for chest pain. 31 tablet 1  . RIVAROXABAN 20 MG PO TABS Oral Take 1 tablet (20 mg total) by mouth daily with supper. 30 tablet 3    BP 139/77  Pulse 47  Temp 98.5 F (36.9 C)  Resp 16  SpO2 99%  Physical Exam  Constitutional: He is oriented to person, place, and time. He appears well-developed and well-nourished. No distress.  HENT:  Head: Normocephalic and atraumatic.  Mouth/Throat: Oropharynx is clear and moist.  Eyes: Conjunctivae normal and EOM are normal. Pupils are equal, round, and reactive to light.  Neck: Normal range of motion. Neck supple. No  JVD present.  Cardiovascular: Normal rate, regular rhythm, normal heart sounds and intact distal pulses.        Trace edema ankles b/l.   Pulmonary/Chest: Effort normal and breath sounds normal. No respiratory distress. He has no rales.  Abdominal: Soft. There is no tenderness.  Musculoskeletal: Normal range of motion.  Neurological: He is alert and oriented to person, place, and time.  Skin: Skin is warm and dry. He is not diaphoretic.  Psychiatric: He has a normal mood and affect. His behavior is normal.    ED Course  Procedures (including critical care time)   Labs Reviewed  CBC WITH DIFFERENTIAL  POCT I-STAT TROPONIN I  GLUCOSE, CAPILLARY  BASIC METABOLIC PANEL  PRO B NATRIURETIC PEPTIDE   Date: 11/08/2011  Rate: 46  Rhythm: sinus bradycardia  QRS Axis: normal  Intervals: normal  ST/T Wave abnormalities: ST depressions laterally  Conduction Disutrbances:none  Narrative Interpretation: t-wave inversions in lateral leads different from EKG on Sep 19 2011. No stemi.  Old EKG Reviewed: changes noted   Dg Chest 2 View  11/08/2011  *RADIOLOGY REPORT*  Clinical Data: Chest pain  CHEST - 2 VIEW  Comparison: Prior chest x-ray 09/19/2011  Findings: The lungs are w mildly hyperinflated. Cardiac silhouette remains at the upper limits of normal.  Negative for pulmonary edema or focal airspace consolidation. No pleural effusion or pneumothorax.  No acute osseous abnormality.  IMPRESSION:  1.  No acute cardiopulmonary disease. 2.  Unchanged borderline cardiomegaly. 3.  Mild pulmonary hyperinflation.   Original Report Authenticated By: Vilma Prader      No diagnosis found.    MDM  56 y/o male with hx of MI in July presenting with intermittent chest pain worsening last night. EKG t-wave changes noted in lateral leads. Pain similar to previous MI. Labs unremarkable. Pain relieved by nitro and percocet. [place] cardiology consulted who will admit patient.        Trevor Mace,  PA-C 11/08/11 1534

## 2011-11-08 NOTE — ED Notes (Signed)
Pt's CBG was 86 when I checked it.2:06pm JG.

## 2011-11-26 ENCOUNTER — Other Ambulatory Visit (INDEPENDENT_AMBULATORY_CARE_PROVIDER_SITE_OTHER): Payer: Managed Care, Other (non HMO)

## 2011-11-26 ENCOUNTER — Ambulatory Visit (INDEPENDENT_AMBULATORY_CARE_PROVIDER_SITE_OTHER): Payer: Managed Care, Other (non HMO) | Admitting: Cardiology

## 2011-11-26 ENCOUNTER — Encounter: Payer: Self-pay | Admitting: Cardiology

## 2011-11-26 VITALS — BP 160/78 | HR 52 | Ht 76.0 in | Wt 212.0 lb

## 2011-11-26 DIAGNOSIS — I255 Ischemic cardiomyopathy: Secondary | ICD-10-CM

## 2011-11-26 DIAGNOSIS — I4891 Unspecified atrial fibrillation: Secondary | ICD-10-CM

## 2011-11-26 DIAGNOSIS — I2510 Atherosclerotic heart disease of native coronary artery without angina pectoris: Secondary | ICD-10-CM

## 2011-11-26 DIAGNOSIS — E785 Hyperlipidemia, unspecified: Secondary | ICD-10-CM

## 2011-11-26 DIAGNOSIS — R600 Localized edema: Secondary | ICD-10-CM

## 2011-11-26 DIAGNOSIS — I2589 Other forms of chronic ischemic heart disease: Secondary | ICD-10-CM

## 2011-11-26 DIAGNOSIS — R609 Edema, unspecified: Secondary | ICD-10-CM

## 2011-11-26 DIAGNOSIS — I10 Essential (primary) hypertension: Secondary | ICD-10-CM

## 2011-11-26 DIAGNOSIS — R0989 Other specified symptoms and signs involving the circulatory and respiratory systems: Secondary | ICD-10-CM

## 2011-11-26 LAB — HEPATIC FUNCTION PANEL
Bilirubin, Direct: 0.1 mg/dL (ref 0.0–0.3)
Total Bilirubin: 0.4 mg/dL (ref 0.3–1.2)
Total Protein: 7.5 g/dL (ref 6.0–8.3)

## 2011-11-26 LAB — BASIC METABOLIC PANEL
Calcium: 9.7 mg/dL (ref 8.4–10.5)
Creatinine, Ser: 1 mg/dL (ref 0.4–1.5)
GFR: 105.9 mL/min (ref >60.00)
Glucose, Bld: 110 mg/dL — ABNORMAL HIGH (ref 70–99)
Sodium: 142 mEq/L (ref 135–145)

## 2011-11-26 LAB — CBC WITH DIFFERENTIAL/PLATELET
Basophils Absolute: 0 10*3/uL (ref 0.0–0.1)
HCT: 43.6 % (ref 39.0–52.0)
Lymphs Abs: 2.8 10*3/uL (ref 0.7–4.0)
MCHC: 31.8 g/dL (ref 30.0–36.0)
MCV: 86.1 fl (ref 78.0–100.0)
Monocytes Absolute: 0.5 10*3/uL (ref 0.1–1.0)
Neutro Abs: 4.3 10*3/uL (ref 1.4–7.7)
Platelets: 255 10*3/uL (ref 150.0–400.0)
RDW: 16.7 % — ABNORMAL HIGH (ref 11.5–14.6)

## 2011-11-26 MED ORDER — AMIODARONE HCL 200 MG PO TABS: 100.0000 mg | ORAL_TABLET | Freq: Every day | ORAL | Status: DC

## 2011-11-26 NOTE — Assessment & Plan Note (Signed)
Maintaining NSR.  Will decrease Amiodarone at his request.  Follow rhythm.  Recheck Echo.  Check liver functions and BMET.

## 2011-11-26 NOTE — Progress Notes (Signed)
HPI:  This patient returns today in followup. We had a very, very long discussion regarding his medications, his overall condition, his ability to return to work. He says he still feels fairly weak. He underwent TEE guided cardioversion in the hospital has been on amiodarone. His pulse is gotten down into the 40s. He's remained on the same amiodarone dose, his wishes to cut this back to half the amount. He would prefer to take this risk as to lowering his metoprolol dose. He's not having any chest pain. He does have mild swelling in the feet, and he says his feet feel like bricks. He's not had much in the way of chest pain he has been trying to get some exercise. We talked about the possibility of repeating an echocardiogram in the near future.  Current Outpatient Prescriptions  Medication Sig Dispense Refill  . amiodarone (PACERONE) 200 MG tablet Take 1 tablet (200 mg total) by mouth daily. For tachycardia      . amLODipine (NORVASC) 5 MG tablet Take 1 tablet (5 mg total) by mouth daily.  30 tablet  11  . colchicine 0.6 MG tablet Take 0.6 mg by mouth daily as needed. For gout symptoms      . glipiZIDE (GLUCOTROL) 5 MG tablet Take 5 mg by mouth 2 (two) times daily before a meal.        . isosorbide mononitrate (IMDUR) 30 MG 24 hr tablet Take 30 mg by mouth daily.      Marland Kitchen lisinopril (PRINIVIL,ZESTRIL) 40 MG tablet Take 40 mg by mouth daily.      . metFORMIN (GLUCOPHAGE) 500 MG tablet Take 500 mg by mouth 2 (two) times daily with a meal. May take 250mg  twice daily if blood sugar is already low      . metoprolol (LOPRESSOR) 50 MG tablet Take 0.5 tablets (25 mg total) by mouth 2 (two) times daily.      . nitroGLYCERIN (NITROSTAT) 0.4 MG SL tablet Place 0.4 mg under the tongue every 5 (five) minutes as needed. For chest pain      . oxyCODONE-acetaminophen (PERCOCET/ROXICET) 5-325 MG per tablet Take 2 tablets by mouth every 4 (four) hours as needed for pain.  6 tablet  0  . Rivaroxaban (XARELTO) 20 MG  TABS Take 20 mg by mouth daily with supper.        No Known Allergies  Past Medical History  Diagnosis Date  . Hypertension   . Diabetes mellitus   . CAD (coronary artery disease)     LHC 7/13: mOM1 50-75%, ostial subbranch 80-90% (very small), PL branch 80-90% (very small), mRCA 40-50%, EF 30% => med Rx for small vessel disease  . Atrial fibrillation     CHADS2=3; Xarelto started 7/13; s/p TEE-DCCV => NSR;  patient back in AF with RVR at f/u 8/13 => amiodarone started => back in NSR 9/13 f/u OV  . Ischemic cardiomyopathy     echo 7/13: mild LVH, EF 25-30%, global HK with severe Hk to AK of basal to mid inf and post walls, severe diast dysfxn, mild MR, mod LAE, mod reduced RV fxn, mod RAE, PASP 50 (mild to mod pulmo HTN);  TEE 7/13 with EF 30-35%  . Epistaxis 08/2011    On heparin and Coumadin s/p silver nitrate cauterization L nare  . Hyperlipidemia   . Chronic combined systolic and diastolic CHF (congestive heart failure)   . Gout   . Tobacco abuse   . Alcohol abuse   .  MI (myocardial infarction)     Past Surgical History  Procedure Date  . Tonsillectomy   . Left thumb surgery   . Adenoidectomy   . Tee without cardioversion 09/15/2011    Procedure: TRANSESOPHAGEAL ECHOCARDIOGRAM (TEE);  Surgeon: Lewayne Bunting, MD;  Location: [facility] ENDOSCOPY;  Service: Cardiovascular;  Laterality: N/A;  . Cardioversion 09/15/2011    Procedure: CARDIOVERSION;  Surgeon: Lewayne Bunting, MD;  Location: [facility] ENDOSCOPY;  Service: Cardiovascular;  Laterality: N/A;  . Cardiac catheterization 09/09/2011    a) widely patent left main with minor luminal irregularities, patent LAD, mild prox LAD calcification and luminal irregularities, patent D1, diffuse prox and LCX- mid irregularity, 50-75% mid OM1 stenosis, 80-90% ostial stenosis in small subbranch, small PLB 80-90% stenosis, RCA- mild proximal luminal irregularity, mid 40-50% stenosis, distal irregularity, PDA and PLB patent; LVEF 30% and global L     Family History  Problem Relation Age of Onset  . Diabetes type II Mother   . Hypertension Father     History   Social History  . Marital Status: Single    Spouse Name: N/A    Number of Children: N/A  . Years of Education: N/A   Occupational History  . Not on file.   Social History Main Topics  . Smoking status: Former Smoker -- 0.5 packs/day for 2 years    Types: Cigarettes  . Smokeless tobacco: Never Used  . Alcohol Use: 3.0 oz/week    6 drink(s) per week     rarely  . Drug Use: No  . Sexually Active: No   Other Topics Concern  . Not on file   Social History Narrative  . No narrative on file    ROS: Please see the HPI.  All other systems reviewed and negative.  PHYSICAL EXAM:  BP 160/78  Pulse 52  Ht 6\' 4"  (1.93 m)  Wt 212 lb (96.163 kg)  BMI 25.81 kg/m2  General: Well developed, well nourished, in no acute distress. Head:  Normocephalic and atraumatic. Neck: no JVD Lungs: Clear to auscultation and percussion. Heart: Normal S1 and S2.  No murmur, rubs or gallops.  Pulses: Pulses normal in all 4 extremities. Extremities: No clubbing or cyanosis. Trace edema.   Neurologic: Alert and oriented x 3.  EKG:  SB.  Rat 52.  Lateral and inferior MI, old.  IACD.  No acute changes.    ASSESSMENT AND PLAN:

## 2011-11-26 NOTE — Assessment & Plan Note (Signed)
Will monitor

## 2011-11-26 NOTE — Patient Instructions (Addendum)
Your physician recommends that you schedule a follow-up appointment in: NOVEMBER  Your physician has recommended you make the following change in your medication: DECREASE Amiodarone to 200mg  take one-half tablet by mouth daily  Your physician recommends that you have lab work today: BMP, CBC, LIVER and Magnesium

## 2011-11-26 NOTE — Assessment & Plan Note (Signed)
Probably related to medication.

## 2011-11-26 NOTE — Assessment & Plan Note (Signed)
Hopefully EF is improved.  Recheck echo at next OV.

## 2011-11-26 NOTE — Assessment & Plan Note (Signed)
Continue medical therapy 

## 2011-12-17 ENCOUNTER — Other Ambulatory Visit (HOSPITAL_COMMUNITY): Payer: Managed Care, Other (non HMO)

## 2011-12-30 ENCOUNTER — Ambulatory Visit: Payer: Managed Care, Other (non HMO) | Admitting: Cardiology

## 2012-01-05 ENCOUNTER — Other Ambulatory Visit (HOSPITAL_COMMUNITY): Payer: Self-pay | Admitting: Radiology

## 2012-01-05 DIAGNOSIS — I4891 Unspecified atrial fibrillation: Secondary | ICD-10-CM

## 2012-01-06 ENCOUNTER — Encounter: Payer: Self-pay | Admitting: Cardiology

## 2012-01-06 ENCOUNTER — Ambulatory Visit (HOSPITAL_COMMUNITY): Payer: Managed Care, Other (non HMO) | Attending: Cardiology | Admitting: Radiology

## 2012-01-06 ENCOUNTER — Ambulatory Visit (INDEPENDENT_AMBULATORY_CARE_PROVIDER_SITE_OTHER): Payer: Managed Care, Other (non HMO) | Admitting: Cardiology

## 2012-01-06 VITALS — BP 182/84 | HR 53 | Ht 76.0 in | Wt 212.0 lb

## 2012-01-06 DIAGNOSIS — I252 Old myocardial infarction: Secondary | ICD-10-CM | POA: Insufficient documentation

## 2012-01-06 DIAGNOSIS — I255 Ischemic cardiomyopathy: Secondary | ICD-10-CM

## 2012-01-06 DIAGNOSIS — M2540 Effusion, unspecified joint: Secondary | ICD-10-CM

## 2012-01-06 DIAGNOSIS — I4891 Unspecified atrial fibrillation: Secondary | ICD-10-CM

## 2012-01-06 DIAGNOSIS — I2589 Other forms of chronic ischemic heart disease: Secondary | ICD-10-CM | POA: Insufficient documentation

## 2012-01-06 DIAGNOSIS — I517 Cardiomegaly: Secondary | ICD-10-CM | POA: Insufficient documentation

## 2012-01-06 DIAGNOSIS — I2510 Atherosclerotic heart disease of native coronary artery without angina pectoris: Secondary | ICD-10-CM | POA: Insufficient documentation

## 2012-01-06 DIAGNOSIS — Z87891 Personal history of nicotine dependence: Secondary | ICD-10-CM | POA: Insufficient documentation

## 2012-01-06 DIAGNOSIS — I10 Essential (primary) hypertension: Secondary | ICD-10-CM | POA: Insufficient documentation

## 2012-01-06 DIAGNOSIS — R609 Edema, unspecified: Secondary | ICD-10-CM | POA: Insufficient documentation

## 2012-01-06 DIAGNOSIS — E785 Hyperlipidemia, unspecified: Secondary | ICD-10-CM | POA: Insufficient documentation

## 2012-01-06 MED ORDER — METOPROLOL TARTRATE 50 MG PO TABS: 50.0000 mg | ORAL_TABLET | Freq: Two times a day (BID) | ORAL | Status: DC

## 2012-01-06 MED ORDER — FUROSEMIDE 20 MG PO TABS: 20.0000 mg | ORAL_TABLET | Freq: Every day | ORAL | Status: DC

## 2012-01-06 NOTE — Progress Notes (Signed)
Echocardiogram performed.  

## 2012-01-06 NOTE — Progress Notes (Signed)
HPI:  The patient is seen today in follow up.  Overall he is stable and he and I reviewed his echocardiogram today.  EF appears improved up to the 45-50% range, but he has stopped some of his medication because it made his feet swell and more painful, so he just stopped.  He stopped taking amlodipine daily, and now his BP has gone back up.  Because of pain and swelling in his feet, he does not feel that he could work, and he has been advised by what sounds like his office benefits coordinator to stay on short term disability unless he could return fully without restrictions.  The patient does not feel that this is possible right now, and he says he could not stay on his feet and do what is required of the job at the present time.  He is planning to meet with the [employer] folks to determine if he is a candidate for full time disability.  His medications have been challenging for sure because of what he can and cannot take.  We have gotten him anticoagulation samples. Denies any active chest pain.  He does not feel that lasix has helped with his swelling, and he says "HCTZ" nearly killed him.  Our office visit, including filling out forms, and discussion, extended beyond one hour.    Current Outpatient Prescriptions  Medication Sig Dispense Refill  . amiodarone (PACERONE) 200 MG tablet Take 0.5 tablets (100 mg total) by mouth daily. For tachycardia      . amLODipine (NORVASC) 5 MG tablet Take 1 tablet (5 mg total) by mouth daily.  30 tablet  11  . colchicine 0.6 MG tablet Take 0.6 mg by mouth daily as needed. For gout symptoms      . glipiZIDE (GLUCOTROL) 5 MG tablet Take 5 mg by mouth 2 (two) times daily before a meal.        . isosorbide dinitrate (ISORDIL) 5 MG tablet       . lisinopril (PRINIVIL,ZESTRIL) 40 MG tablet Take 40 mg by mouth daily.      . metFORMIN (GLUCOPHAGE) 500 MG tablet Take 500 mg by mouth 2 (two) times daily with a meal. May take 250mg  twice daily if blood sugar is already low      .  metoprolol (LOPRESSOR) 50 MG tablet Take 0.5 tablets (25 mg total) by mouth 2 (two) times daily.      . nitroGLYCERIN (NITROSTAT) 0.4 MG SL tablet Place 0.4 mg under the tongue every 5 (five) minutes as needed. For chest pain      . oxyCODONE-acetaminophen (PERCOCET/ROXICET) 5-325 MG per tablet Take 2 tablets by mouth every 4 (four) hours as needed for pain.  6 tablet  0  . Rivaroxaban (XARELTO) 20 MG TABS Take 20 mg by mouth daily with supper.        No Known Allergies  Past Medical History  Diagnosis Date  . Hypertension   . Diabetes mellitus   . CAD (coronary artery disease)     LHC 7/13: mOM1 50-75%, ostial subbranch 80-90% (very small), PL branch 80-90% (very small), mRCA 40-50%, EF 30% => med Rx for small vessel disease  . Atrial fibrillation     CHADS2=3; Xarelto started 7/13; s/p TEE-DCCV => NSR;  patient back in AF with RVR at f/u 8/13 => amiodarone started => back in NSR 9/13 f/u OV  . Ischemic cardiomyopathy     echo 7/13: mild LVH, EF 25-30%, global HK with severe Hk to  AK of basal to mid inf and post walls, severe diast dysfxn, mild MR, mod LAE, mod reduced RV fxn, mod RAE, PASP 50 (mild to mod pulmo HTN);  TEE 7/13 with EF 30-35%  . Epistaxis 08/2011    On heparin and Coumadin s/p silver nitrate cauterization L nare  . Hyperlipidemia   . Chronic combined systolic and diastolic CHF (congestive heart failure)   . Gout   . Tobacco abuse   . Alcohol abuse   . MI (myocardial infarction)     Past Surgical History  Procedure Date  . Tonsillectomy   . Left thumb surgery   . Adenoidectomy   . Tee without cardioversion 09/15/2011    Procedure: TRANSESOPHAGEAL ECHOCARDIOGRAM (TEE);  Surgeon: Lewayne Bunting, MD;  Location: [facility] ENDOSCOPY;  Service: Cardiovascular;  Laterality: N/A;  . Cardioversion 09/15/2011    Procedure: CARDIOVERSION;  Surgeon: Lewayne Bunting, MD;  Location: [facility] ENDOSCOPY;  Service: Cardiovascular;  Laterality: N/A;  . Cardiac catheterization 09/09/2011    a)  widely patent left main with minor luminal irregularities, patent LAD, mild prox LAD calcification and luminal irregularities, patent D1, diffuse prox and LCX- mid irregularity, 50-75% mid OM1 stenosis, 80-90% ostial stenosis in small subbranch, small PLB 80-90% stenosis, RCA- mild proximal luminal irregularity, mid 40-50% stenosis, distal irregularity, PDA and PLB patent; LVEF 30% and global L    Family History  Problem Relation Age of Onset  . Diabetes type II Mother   . Hypertension Father     History   Social History  . Marital Status: Single    Spouse Name: N/A    Number of Children: N/A  . Years of Education: N/A   Occupational History  . Not on file.   Social History Main Topics  . Smoking status: Former Smoker -- 0.5 packs/day for 2 years    Types: Cigarettes  . Smokeless tobacco: Never Used  . Alcohol Use: 3.0 oz/week    6 drink(s) per week     Comment: rarely  . Drug Use: No  . Sexually Active: No   Other Topics Concern  . Not on file   Social History Narrative  . No narrative on file    ROS: Please see the HPI.  All other systems reviewed and negative.  PHYSICAL EXAM:  BP 182/84  Pulse 53  Ht 6\' 4"  (1.93 m)  Wt 212 lb (96.163 kg)  BMI 25.81 kg/m2  SpO2 98%  General: Well developed, well nourished, in no acute distress. Head:  Normocephalic and atraumatic. Neck: no JVD Lungs: Clear to auscultation and percussion. Heart: Normal S1 and S2. Prominent S4 gallop.   Abdomen:  Normal bowel sounds; soft; non tender; no organomegaly Pulses: Pulses normal in all 4 extremities. Extremities: No clubbing or cyanosis. No obvious edema.  He does have a "corn" on the ventral aspect of his R foot.  Does not appear to be an active ulcer at this point.   Neurologic: Alert and oriented x 3.  EKG:  SB with LVH and repole changes.    ECHO:  Impressions:  - Normal LV size with mild LV hypertrophy. EF 45-50% with basal to mid posterior akinesis and  anterolateral hypokinesis. Normal RV size and systolic function. No significant valvular abnormalities.  CATH DATA   AO 139/97  LV 139/29  Coronary angiography:  Coronary dominance: right  Left mainstem: Widely patent with minor luminal irregularity.  Left anterior descending (LAD): Patent to the left ventricular apex. The first diagonal branch is  patent. There is mild proximal LAD calcification and also mild luminal irregularity but there are no significant stenoses throughout the course of the LAD distribution.  Left circumflex (LCx): The left circumflex is a large-caliber vessel. The proximal and mid circumflex have mild diffuse irregularity. The first OM is patent but in the midportion of this vessel there is 50-75% stenosis. There is also a subbranch that has tight ostial stenosis but this is a very small vessel. The percent stenosis is estimated at 80-90. The continuation of the AV groove circumflex supplies a posterolateral branch which also has 80-90% stenosis. This is a very small vessel less than 1.5 mm in caliber.  Right coronary artery (RCA): The right coronary artery is dominant. There is mild luminal irregularity in the proximal vessel. The mid vessel has 40-50% stenosis. The distal vessel has luminal irregularity. The PDA and posterolateral branches are patent  Left ventriculography: The left ventricle is poorly visualized with ventriculography, despite a 30 cc injection. However, there is clearly global left ventricular dysfunction and I would estimate the ejection fraction in the range of 30%.  Final Conclusions:  1. Distal vessel coronary artery disease primarily in the left circumflex distribution as detailed above  2. Nonobstructive LAD and right coronary artery stenoses  3. Severe global left ventricular dysfunction  Recommendations: Medical therapy. Plan will have to be made regarding anticoagulation in this patient with atrial fibrillation and cardiomyopathy, but this is  complicated by concerns about medicine adherence and alcohol use.  Tonny Bollman  09/09/2011, 4:54 PM  ASSESSMENT AND PLAN:

## 2012-01-06 NOTE — Patient Instructions (Addendum)
Your physician has recommended you make the following change in your medication: STOP Amiodarone and in 4 days please INCREASE your Metoprolol Tartrate to 50mg  take one by mouth twice a day, START Furosemide 20mg  take one by mouth daily  Your physician recommends that you schedule a follow-up appointment with Dr Prince Rome.  Your physician recommends that you return for lab work in: 1 WEEK (BMP)--pt refused to schedule while in check out  Your physician recommends that you schedule a follow-up appointment in: 1 WEEK with PA/NP due to slow heart rate--pt refused to schedule while in check out  Your physician recommends that you schedule a follow-up appointment in: 1 MONTH with Dr Riley Kill

## 2012-01-07 ENCOUNTER — Telehealth: Payer: Self-pay | Admitting: Cardiology

## 2012-01-07 NOTE — Assessment & Plan Note (Signed)
No recurrence at this time.  We are backing off of the amiodarone, but he has the potential to return as his rate is slow on amio.  I told him alternative agents with CAD would likely require hospitalization.

## 2012-01-07 NOTE — Assessment & Plan Note (Signed)
Continue medical therapy.  Is also on an antithrombin.

## 2012-01-07 NOTE — Assessment & Plan Note (Signed)
He does have history of gout and is on cochicine.

## 2012-01-07 NOTE — Assessment & Plan Note (Addendum)
This is poorly controlled at the present time.  HTN presents a clinical challenge as he has difficulty with many medications.  He complained of swelling with amlodipine, but it did control his BP better.  HCTZ is not a good option for him.  He is bradycardiac, but some of that is due to amiodarone, which I think we will need to stop.  After three days, will have him increase his metoprolol again, and I would like to get him back to the office later in week or early next week but he might not agree to do.  However, we would like to control his rate should he go in to afib, and would like to lower BP.  He may need pacing in future.  I also asked him to increase his furosemide to a daily basis, but he may not agree because (make him pee).  He also feels it does not work.  Am concerned about the potential for recurrent atrial fib, and I told him this.  Also, I expressed that we need to get his BP down.  Office visit extended beyond an hour and I filled out his disability form suggesting he cannot work at present.  We will try to see him soon in follow up.

## 2012-01-07 NOTE — Telephone Encounter (Signed)
Faxed [employer] Disability paperwork to [phone]

## 2012-01-09 NOTE — Telephone Encounter (Signed)
Pt aware [employer] paperwork Faxed,[employer] Mailed to  Pt per His Request

## 2012-01-14 ENCOUNTER — Other Ambulatory Visit: Payer: Managed Care, Other (non HMO)

## 2012-01-15 ENCOUNTER — Ambulatory Visit (INDEPENDENT_AMBULATORY_CARE_PROVIDER_SITE_OTHER): Payer: Managed Care, Other (non HMO)

## 2012-01-15 ENCOUNTER — Other Ambulatory Visit (INDEPENDENT_AMBULATORY_CARE_PROVIDER_SITE_OTHER): Payer: Managed Care, Other (non HMO)

## 2012-01-15 VITALS — BP 160/100 | HR 46 | Resp 12

## 2012-01-15 DIAGNOSIS — E785 Hyperlipidemia, unspecified: Secondary | ICD-10-CM

## 2012-01-15 DIAGNOSIS — I255 Ischemic cardiomyopathy: Secondary | ICD-10-CM

## 2012-01-15 DIAGNOSIS — I4891 Unspecified atrial fibrillation: Secondary | ICD-10-CM

## 2012-01-15 DIAGNOSIS — I2589 Other forms of chronic ischemic heart disease: Secondary | ICD-10-CM

## 2012-01-15 DIAGNOSIS — I2510 Atherosclerotic heart disease of native coronary artery without angina pectoris: Secondary | ICD-10-CM

## 2012-01-15 DIAGNOSIS — I10 Essential (primary) hypertension: Secondary | ICD-10-CM

## 2012-01-15 LAB — BASIC METABOLIC PANEL
BUN: 17 mg/dL (ref 6–23)
Chloride: 106 mEq/L (ref 96–112)
GFR: 114.13 mL/min (ref >60.00)
Potassium: 4.2 mEq/L (ref 3.5–5.1)
Sodium: 142 mEq/L (ref 135–145)

## 2012-01-15 NOTE — Progress Notes (Signed)
The pt came into the office today for an EKG and BMP.  The pt did stop Amiodarone as instructed and increased his Metoprolol Tartrate to 50mg  twice a day. EKG today shows pulse of 46.  I instructed the pt to decrease Metoprolol Tartrate to 25mg  twice a day.  The pt's BP in the office was 160/100 (left arm).  Will await results of BMP before making further changes in medication to treat BP. The pt can be contacted at [phone].

## 2012-01-16 MED ORDER — NISOLDIPINE ER 17 MG PO TB24: 17.0000 mg | ORAL_TABLET | Freq: Every day | ORAL | Status: DC

## 2012-01-16 NOTE — Progress Notes (Signed)
Per Dr Riley Kill he would like the pt to start Sular 17mg  one by mouth daily. I spoke with the pt and made him aware of Dr Rosalyn Charters recommendation. Rx sent to pharmacy.

## 2012-01-19 ENCOUNTER — Telehealth: Payer: Self-pay | Admitting: Cardiology

## 2012-01-19 NOTE — Telephone Encounter (Signed)
I spoke with the pt and made him aware that we do want him to take Sular as instructed by Dr Riley Kill. If the pt has any side effects then he will contact our office.

## 2012-01-19 NOTE — Telephone Encounter (Signed)
New problem;   Was told that new medication will  not cause rapid heart rate. pharmacy advise patient of side effect:   rapid heart rate, constipation. Patient has questions.

## 2012-02-16 ENCOUNTER — Encounter (INDEPENDENT_AMBULATORY_CARE_PROVIDER_SITE_OTHER): Payer: Managed Care, Other (non HMO)

## 2012-02-16 ENCOUNTER — Ambulatory Visit (INDEPENDENT_AMBULATORY_CARE_PROVIDER_SITE_OTHER): Payer: Managed Care, Other (non HMO) | Admitting: Cardiology

## 2012-02-16 ENCOUNTER — Encounter: Payer: Self-pay | Admitting: Cardiology

## 2012-02-16 VITALS — BP 168/80 | HR 55 | Ht 76.0 in | Wt 215.0 lb

## 2012-02-16 DIAGNOSIS — I5043 Acute on chronic combined systolic (congestive) and diastolic (congestive) heart failure: Secondary | ICD-10-CM

## 2012-02-16 DIAGNOSIS — G629 Polyneuropathy, unspecified: Secondary | ICD-10-CM | POA: Insufficient documentation

## 2012-02-16 DIAGNOSIS — I4891 Unspecified atrial fibrillation: Secondary | ICD-10-CM

## 2012-02-16 DIAGNOSIS — I255 Ischemic cardiomyopathy: Secondary | ICD-10-CM

## 2012-02-16 DIAGNOSIS — I739 Peripheral vascular disease, unspecified: Secondary | ICD-10-CM

## 2012-02-16 DIAGNOSIS — I2589 Other forms of chronic ischemic heart disease: Secondary | ICD-10-CM

## 2012-02-16 DIAGNOSIS — I2510 Atherosclerotic heart disease of native coronary artery without angina pectoris: Secondary | ICD-10-CM

## 2012-02-16 DIAGNOSIS — I10 Essential (primary) hypertension: Secondary | ICD-10-CM

## 2012-02-16 DIAGNOSIS — L97909 Non-pressure chronic ulcer of unspecified part of unspecified lower leg with unspecified severity: Secondary | ICD-10-CM

## 2012-02-16 DIAGNOSIS — I509 Heart failure, unspecified: Secondary | ICD-10-CM

## 2012-02-16 DIAGNOSIS — G609 Hereditary and idiopathic neuropathy, unspecified: Secondary | ICD-10-CM

## 2012-02-16 NOTE — Assessment & Plan Note (Addendum)
This is slightly improved. He will need to remain compliant with his meds, and monitor his BP.  He will need to maintain primary follow up with Dr. Prince Rome as well.  Adjustments may need to be made over time, but this will be difficult due to multiple drug intolerances.

## 2012-02-16 NOTE — Assessment & Plan Note (Signed)
We stopped amiodarone due to bradycardia, and he is maintaining NSR at this point.

## 2012-02-16 NOTE — Patient Instructions (Addendum)
Your physician recommends that you schedule a follow-up appointment in: 6 WEEKS  Your physician recommends that you have lab work today: BMP and CBC  Your physician recommends that you continue on your current medications as directed. Please refer to the Current Medication list given to you today.

## 2012-02-16 NOTE — Assessment & Plan Note (Signed)
Combo from ischemia and hypertension.  Continue on meds for now.

## 2012-02-16 NOTE — Assessment & Plan Note (Signed)
Overall EF has improved.

## 2012-02-16 NOTE — Assessment & Plan Note (Signed)
Has some reduction in digital pressures, although ABI are near normal.  Has foot ulcers and being treated by the foot center.

## 2012-02-16 NOTE — Assessment & Plan Note (Signed)
Had NM studies done which reveal a polyneuropathy---??? Source  ? DM or other, or mixed.  He is to see Dr. Prince Rome tomorrow for disposition  ?neuro consult.  This may well be the source of his symptoms, painful feet when walking.   It likely explains as well why he feels he cannot work, and it seems reasonable.

## 2012-02-16 NOTE — Progress Notes (Signed)
HPI:  Patient seen today in followup. He had peripheral EMGs done, and this demonstrated a polyneuropathy. The etiology of this is unknown, but he has not seen a neurologist. He complains of chronic foot problems. He also had peripheral vascular studies today, and these demonstrate some reduction toe pressures. [facility], and they're treating him for couple of ulcers. He denies any chest pain, shortness of breath is improved. He does not think that he could return to work primarily because his feet bother him all the time. He does have a long-standing history of diabetes mellitus.  Current Outpatient Prescriptions  Medication Sig Dispense Refill  . colchicine 0.6 MG tablet Take 0.6 mg by mouth daily as needed. For gout symptoms      . furosemide (LASIX) 20 MG tablet Take 1 tablet (20 mg total) by mouth daily.  30 tablet  3  . glipiZIDE (GLUCOTROL) 5 MG tablet Take 5 mg by mouth 2 (two) times daily before a meal.        . lisinopril (PRINIVIL,ZESTRIL) 40 MG tablet Take 40 mg by mouth daily.      . metFORMIN (GLUCOPHAGE) 500 MG tablet Take 500 mg by mouth 2 (two) times daily with a meal. May take 250mg  twice daily if blood sugar is already low      . metoprolol (LOPRESSOR) 50 MG tablet Take 0.5 tablets (25 mg total) by mouth 2 (two) times daily.  1 tablet  0  . nisoldipine (SULAR) 17 MG 24 hr tablet Take 1 tablet (17 mg total) by mouth daily.  30 tablet  6  . nitroGLYCERIN (NITROSTAT) 0.4 MG SL tablet Place 0.4 mg under the tongue every 5 (five) minutes as needed. For chest pain      . Rivaroxaban (XARELTO) 20 MG TABS Take 20 mg by mouth daily with supper.      Marland Kitchen oxyCODONE-acetaminophen (PERCOCET/ROXICET) 5-325 MG per tablet Take 2 tablets by mouth every 4 (four) hours as needed for pain.  6 tablet  0    No Known Allergies  Past Medical History  Diagnosis Date  . Hypertension   . Diabetes mellitus   . CAD (coronary artery disease)     LHC 7/13: mOM1 50-75%,  ostial subbranch 80-90% (very small), PL branch 80-90% (very small), mRCA 40-50%, EF 30% => med Rx for small vessel disease  . Atrial fibrillation     CHADS2=3; Xarelto started 7/13; s/p TEE-DCCV => NSR;  patient back in AF with RVR at f/u 8/13 => amiodarone started => back in NSR 9/13 f/u OV  . Ischemic cardiomyopathy     echo 7/13: mild LVH, EF 25-30%, global HK with severe Hk to AK of basal to mid inf and post walls, severe diast dysfxn, mild MR, mod LAE, mod reduced RV fxn, mod RAE, PASP 50 (mild to mod pulmo HTN);  TEE 7/13 with EF 30-35%  . Epistaxis 08/2011    On heparin and Coumadin s/p silver nitrate cauterization L nare  . Hyperlipidemia   . Chronic combined systolic and diastolic CHF (congestive heart failure)   . Gout   . Tobacco abuse   . Alcohol abuse   . MI (myocardial infarction)     Past Surgical History  Procedure Date  . Tonsillectomy   . Left thumb surgery   . Adenoidectomy   . Tee without cardioversion 09/15/2011    Procedure: TRANSESOPHAGEAL ECHOCARDIOGRAM (TEE);  Surgeon: Lewayne Bunting, MD;  Location: [facility] ENDOSCOPY;  Service: Cardiovascular;  Laterality: N/A;  . Cardioversion 09/15/2011    Procedure: CARDIOVERSION;  Surgeon: Lewayne Bunting, MD;  Location: [facility] ENDOSCOPY;  Service: Cardiovascular;  Laterality: N/A;  . Cardiac catheterization 09/09/2011    a) widely patent left main with minor luminal irregularities, patent LAD, mild prox LAD calcification and luminal irregularities, patent D1, diffuse prox and LCX- mid irregularity, 50-75% mid OM1 stenosis, 80-90% ostial stenosis in small subbranch, small PLB 80-90% stenosis, RCA- mild proximal luminal irregularity, mid 40-50% stenosis, distal irregularity, PDA and PLB patent; LVEF 30% and global L    Family History  Problem Relation Age of Onset  . Diabetes type II Mother   . Hypertension Father     History   Social History  . Marital Status: Single    Spouse Name: N/A    Number of Children: N/A  . Years  of Education: N/A   Occupational History  . Not on file.   Social History Main Topics  . Smoking status: Former Smoker -- 0.5 packs/day for 2 years    Types: Cigarettes  . Smokeless tobacco: Never Used  . Alcohol Use: 3.0 oz/week    6 drink(s) per week     Comment: rarely  . Drug Use: No  . Sexually Active: No   Other Topics Concern  . Not on file   Social History Narrative  . No narrative on file    ROS: Please see the HPI.  All other systems reviewed and negative.  PHYSICAL EXAM:  BP 168/80  Pulse 55  Ht 6\' 4"  (1.93 m)  Wt 215 lb (97.523 kg)  BMI 26.17 kg/m2  SpO2 98%  General: Well developed, well nourished, in no acute distress. Head:  Normocephalic and atraumatic. Neck: no JVD Lungs: Clear to auscultation and percussion. Heart: Normal S1 and S2.  No murmur, rubs or gallops.  Abdomen:  Normal bowel sounds; soft; non tender; no organomegaly Pulses: Pulses normal in all 4 extremities. Extremities: right foot wrapped, left toe wrapped.   Neurologic: Alert and oriented x 3.  EKG:  NSR.  Inferior MI, old.  LVH.  Lateral MI, indeterminate age.    ASSESSMENT AND PLAN:

## 2012-02-17 LAB — CBC WITH DIFFERENTIAL/PLATELET
Basophils Relative: 1.2 % (ref 0.0–3.0)
Eosinophils Absolute: 0.1 10*3/uL (ref 0.0–0.7)
HCT: 39.5 % (ref 39.0–52.0)
Hemoglobin: 13 g/dL (ref 13.0–17.0)
Lymphocytes Relative: 31.9 % (ref 12.0–46.0)
Lymphs Abs: 2.5 10*3/uL (ref 0.7–4.0)
MCHC: 32.9 g/dL (ref 30.0–36.0)
Monocytes Relative: 6.2 % (ref 3.0–12.0)
Neutro Abs: 4.6 10*3/uL (ref 1.4–7.7)
RBC: 4.79 Mil/uL (ref 4.22–5.81)

## 2012-02-17 LAB — BASIC METABOLIC PANEL
CO2: 31 mEq/L (ref 19–32)
Calcium: 9.9 mg/dL (ref 8.4–10.5)
Glucose, Bld: 87 mg/dL (ref 70–99)
Potassium: 4.3 mEq/L (ref 3.5–5.1)
Sodium: 141 mEq/L (ref 135–145)

## 2012-02-23 ENCOUNTER — Telehealth: Payer: Self-pay | Admitting: Cardiology

## 2012-02-23 NOTE — Telephone Encounter (Signed)
Pt checked his b/p with home monitor and saw a indication that he has a irregular heart beat and he wants to know should he come in for that

## 2012-02-23 NOTE — Telephone Encounter (Signed)
Left message to call back  

## 2012-02-23 NOTE — Telephone Encounter (Signed)
I spoke with the pt and his pulse on monitor was 78.  The pt does have a monitor that shows if he has irregular pulse. I reviewed the pt's recent EKGs and he is having PAC's. I made the pt aware that this is most likely what his monitor is picking up.  The pt has a history of AFib but has been in NSR. The pt will keep his scheduled follow-up with Dr Riley Kill and continue to monitor BP and pulse at home.

## 2012-03-30 ENCOUNTER — Ambulatory Visit (INDEPENDENT_AMBULATORY_CARE_PROVIDER_SITE_OTHER): Payer: Managed Care, Other (non HMO) | Admitting: Cardiology

## 2012-03-30 ENCOUNTER — Encounter: Payer: Self-pay | Admitting: Cardiology

## 2012-03-30 VITALS — BP 168/88 | HR 59 | Ht 76.0 in | Wt 217.0 lb

## 2012-03-30 DIAGNOSIS — G609 Hereditary and idiopathic neuropathy, unspecified: Secondary | ICD-10-CM

## 2012-03-30 DIAGNOSIS — I4891 Unspecified atrial fibrillation: Secondary | ICD-10-CM

## 2012-03-30 DIAGNOSIS — I2510 Atherosclerotic heart disease of native coronary artery without angina pectoris: Secondary | ICD-10-CM

## 2012-03-30 DIAGNOSIS — E785 Hyperlipidemia, unspecified: Secondary | ICD-10-CM

## 2012-03-30 DIAGNOSIS — G629 Polyneuropathy, unspecified: Secondary | ICD-10-CM

## 2012-03-30 DIAGNOSIS — I10 Essential (primary) hypertension: Secondary | ICD-10-CM

## 2012-03-30 NOTE — Progress Notes (Signed)
HPI:  The patient returns in followup. He is overall relatively stable. He's been seen by both neurology, and he clearly does have a neuropathy. In addition, the patient does have a low TSH, and borderline low T4 and T3. Dr. Prince Rome made him an appointment to see Dr. Leslie Dales, but the patient could not go because of the co-pay required.  He is trying to go now.  His BP varies quite a bit, but has not been to bad most of the time.  He did also have a brief episode of chest pain laterally, but it resolved.    Patient is on Xarelto.  He is followed by Dr. Prince Rome fairly closely.    Current Outpatient Prescriptions  Medication Sig Dispense Refill  . colchicine 0.6 MG tablet Take 0.6 mg by mouth daily as needed. For gout symptoms      . furosemide (LASIX) 20 MG tablet Take 1 tablet (20 mg total) by mouth daily.  30 tablet  3  . glipiZIDE (GLUCOTROL) 5 MG tablet Take 5 mg by mouth 2 (two) times daily before a meal.        . lisinopril (PRINIVIL,ZESTRIL) 40 MG tablet Take 40 mg by mouth daily.      . metFORMIN (GLUCOPHAGE) 500 MG tablet Take 500 mg by mouth 2 (two) times daily with a meal. May take 250mg  twice daily if blood sugar is already low      . metoprolol (LOPRESSOR) 50 MG tablet Take 0.5 tablets (25 mg total) by mouth 2 (two) times daily.  1 tablet  0  . nisoldipine (SULAR) 17 MG 24 hr tablet Take 1 tablet (17 mg total) by mouth daily.  30 tablet  6  . nitroGLYCERIN (NITROSTAT) 0.4 MG SL tablet Place 0.4 mg under the tongue every 5 (five) minutes as needed. For chest pain      . oxyCODONE-acetaminophen (PERCOCET/ROXICET) 5-325 MG per tablet Take 2 tablets by mouth every 4 (four) hours as needed for pain.  6 tablet  0  . Rivaroxaban (XARELTO) 20 MG TABS Take 20 mg by mouth daily with supper.       No current facility-administered medications for this visit.    No Known Allergies  Past Medical History  Diagnosis Date  . Hypertension   . Diabetes mellitus   . CAD (coronary artery disease)     LHC 7/13: mOM1 50-75%, ostial subbranch 80-90% (very small), PL branch 80-90% (very small), mRCA 40-50%, EF 30% => med Rx for small vessel disease  . Atrial fibrillation     CHADS2=3; Xarelto started 7/13; s/p TEE-DCCV => NSR;  patient back in AF with RVR at f/u 8/13 => amiodarone started => back in NSR 9/13 f/u OV  . Ischemic cardiomyopathy     echo 7/13: mild LVH, EF 25-30%, global HK with severe Hk to AK of basal to mid inf and post walls, severe diast dysfxn, mild MR, mod LAE, mod reduced RV fxn, mod RAE, PASP 50 (mild to mod pulmo HTN);  TEE 7/13 with EF 30-35%  . Epistaxis 08/2011    On heparin and Coumadin s/p silver nitrate cauterization L nare  . Hyperlipidemia   . Chronic combined systolic and diastolic CHF (congestive heart failure)   . Gout   . Tobacco abuse   . Alcohol abuse   . MI (myocardial infarction)     Past Surgical History  Procedure Laterality Date  . Tonsillectomy    . Left thumb surgery    . Adenoidectomy    .  Tee without cardioversion  09/15/2011    Procedure: TRANSESOPHAGEAL ECHOCARDIOGRAM (TEE);  Surgeon: Lewayne Bunting, MD;  Location: [facility] ENDOSCOPY;  Service: Cardiovascular;  Laterality: N/A;  . Cardioversion  09/15/2011    Procedure: CARDIOVERSION;  Surgeon: Lewayne Bunting, MD;  Location: [facility] ENDOSCOPY;  Service: Cardiovascular;  Laterality: N/A;  . Cardiac catheterization  09/09/2011    a) widely patent left main with minor luminal irregularities, patent LAD, mild prox LAD calcification and luminal irregularities, patent D1, diffuse prox and LCX- mid irregularity, 50-75% mid OM1 stenosis, 80-90% ostial stenosis in small subbranch, small PLB 80-90% stenosis, RCA- mild proximal luminal irregularity, mid 40-50% stenosis, distal irregularity, PDA and PLB patent; LVEF 30% and global L    Family History  Problem Relation Age of Onset  . Diabetes type II Mother   . Hypertension Father     History   Social History  . Marital Status: Single    Spouse Name:  N/A    Number of Children: N/A  . Years of Education: N/A   Occupational History  . Not on file.   Social History Main Topics  . Smoking status: Former Smoker -- 0.50 packs/day for 2 years    Types: Cigarettes  . Smokeless tobacco: Never Used  . Alcohol Use: 3.0 oz/week    6 drink(s) per week     Comment: rarely  . Drug Use: No  . Sexually Active: No   Other Topics Concern  . Not on file   Social History Narrative  . No narrative on file    ROS: Please see the HPI.  All other systems reviewed and negative.  PHYSICAL EXAM:  BP 168/88  Pulse 59  Ht 6\' 4"  (1.93 m)  Wt 217 lb (98.431 kg)  BMI 26.43 kg/m2  SpO2 98%  General: Well developed, well nourished, in no acute distress. Head:  Normocephalic and atraumatic. Neck: no JVD Lungs: Clear to auscultation and percussion. Heart: Normal S1 and S2.  No murmur, rubs or gallops.  Pulses: Pulses normal in all 4 extremities. Extremities: No clubbing or cyanosis. No edema. Neurologic: Alert and oriented x 3.  EKG:  SB.  Marked Sinus arrhythmia. Occasional PVCs.  LVH with repole changes.   ECHO  12/2011   Study Conclusions  - Left ventricle: The cavity size was normal. Wall thickness was increased in a pattern of mild LVH. Basal to mid posterior akinesis, anterolateral hypokinesis. Systolic function was mildly reduced. The estimated ejection fraction was in the range of 45% to 50%. Features are consistent with a pseudonormal left ventricular filling pattern, with concomitant abnormal relaxation and increased filling pressure (grade 2 diastolic dysfunction). - Aortic valve: There was no stenosis. - Mitral valve: Trivial regurgitation. - Left atrium: The atrium was mildly to moderately dilated. - Right ventricle: The cavity size was normal. Systolic function was normal. - Right atrium: The atrium was mildly dilated. - Pulmonary arteries: No complete TR doppler jet so unable to estimate PA systolic  pressure. Impressions:  - Normal LV size with mild LV hypertrophy. EF 45-50% with basal to mid posterior akinesis and anterolateral hypokinesis. Normal RV size and systolic function. No significant valvular abnormalities.  CATH Left mainstem: Widely patent with minor luminal irregularity.  Left anterior descending (LAD): Patent to the left ventricular apex. The first diagonal branch is patent. There is mild proximal LAD calcification and also mild luminal irregularity but there are no significant stenoses throughout the course of the LAD distribution.  Left circumflex (LCx): The left  circumflex is a large-caliber vessel. The proximal and mid circumflex have mild diffuse irregularity. The first OM is patent but in the midportion of this vessel there is 50-75% stenosis. There is also a subbranch that has tight ostial stenosis but this is a very small vessel. The percent stenosis is estimated at 80-90. The continuation of the AV groove circumflex supplies a posterolateral branch which also has 80-90% stenosis. This is a very small vessel less than 1.5 mm in caliber.  Right coronary artery (RCA): The right coronary artery is dominant. There is mild luminal irregularity in the proximal vessel. The mid vessel has 40-50% stenosis. The distal vessel has luminal irregularity. The PDA and posterolateral branches are patent  Left ventriculography: The left ventricle is poorly visualized with ventriculography, despite a 30 cc injection. However, there is clearly global left ventricular dysfunction and I would estimate the ejection fraction in the range of 30%.  Final Conclusions:  1. Distal vessel coronary artery disease primarily in the left circumflex distribution as detailed above  2. Nonobstructive LAD and right coronary artery stenoses  3. Severe global left ventricular dysfunction       ASSESSMENT AND PLAN:  Fortunately, his LV function has improved.  He has CAD, but mainly involving the distal  CFX system, with non obstructive disease elsewhere.  EF exceeds 45% at this point in time.  He is on an antithrombin.  Continue to treat medically.  He does need a neuro workup for his neuropathy.

## 2012-03-30 NOTE — Patient Instructions (Signed)
Your physician recommends that you schedule a follow-up appointment in: 3 MONTHS with Dr Crenshaw (previous pt of Dr Stuckey)  Your physician recommends that you continue on your current medications as directed. Please refer to the Current Medication list given to you today.  

## 2012-04-09 NOTE — Assessment & Plan Note (Signed)
No current angina at this point.  Continue meds.

## 2012-04-09 NOTE — Assessment & Plan Note (Signed)
Needs workup as noted.

## 2012-04-09 NOTE — Assessment & Plan Note (Signed)
Not currently on a statin---med tolerances are an issue.

## 2012-04-09 NOTE — Assessment & Plan Note (Signed)
Maintaining NSR.  Currently on xarelto. Will need to follow CBC.  Encouraged--no ETOH.

## 2012-04-09 NOTE — Assessment & Plan Note (Signed)
BP is borderline control, but could be tricky at this point as he is largely medication intolerant.  He is tolerating his current meds.

## 2012-04-26 ENCOUNTER — Telehealth: Payer: Self-pay | Admitting: *Deleted

## 2012-04-26 NOTE — Telephone Encounter (Signed)
Per note from Dr. Riley Kill pt needs to have CBC checked.  I called pt to schedule. He states he is unable to come in for lab work as he is unable to afford it. He states unless it is free he can not have it done.  Also states he is taking Xarelto but not taking it as regularly as he should because it causes him to bleed easily. I told pt it was important to take as directed and  I would forward message to Dr. Riley Kill.

## 2012-04-27 NOTE — Telephone Encounter (Signed)
This was noted.  Hopefully I can see him before I leave.  TS

## 2012-04-30 ENCOUNTER — Other Ambulatory Visit: Payer: Managed Care, Other (non HMO) | Admitting: *Deleted

## 2012-04-30 ENCOUNTER — Ambulatory Visit (INDEPENDENT_AMBULATORY_CARE_PROVIDER_SITE_OTHER): Payer: Managed Care, Other (non HMO) | Admitting: *Deleted

## 2012-04-30 DIAGNOSIS — E785 Hyperlipidemia, unspecified: Secondary | ICD-10-CM

## 2012-04-30 DIAGNOSIS — I10 Essential (primary) hypertension: Secondary | ICD-10-CM

## 2012-04-30 DIAGNOSIS — I4891 Unspecified atrial fibrillation: Secondary | ICD-10-CM

## 2012-04-30 LAB — CBC WITH DIFFERENTIAL/PLATELET
Basophils Absolute: 0 10*3/uL (ref 0.0–0.1)
Basophils Relative: 0.4 % (ref 0.0–3.0)
Eosinophils Absolute: 0.1 10*3/uL (ref 0.0–0.7)
Eosinophils Relative: 2.1 % (ref 0.0–5.0)
HCT: 41.7 % (ref 39.0–52.0)
Hemoglobin: 13.8 g/dL (ref 13.0–17.0)
Lymphocytes Relative: 36.9 % (ref 12.0–46.0)
Lymphs Abs: 2.3 10*3/uL (ref 0.7–4.0)
MCHC: 33.1 g/dL (ref 30.0–36.0)
MCV: 80.5 fl (ref 78.0–100.0)
Monocytes Absolute: 0.5 10*3/uL (ref 0.1–1.0)
Monocytes Relative: 7.7 % (ref 3.0–12.0)
Neutro Abs: 3.3 10*3/uL (ref 1.4–7.7)
Neutrophils Relative %: 52.9 % (ref 43.0–77.0)
Platelets: 260 10*3/uL (ref 150.0–400.0)
RBC: 5.18 Mil/uL (ref 4.22–5.81)
RDW: 15.7 % — ABNORMAL HIGH (ref 11.5–14.6)
WBC: 6.2 10*3/uL (ref 4.5–10.5)

## 2012-04-30 NOTE — Telephone Encounter (Signed)
Pt scheduled to see Dr Stuckey on 05/25/12.  

## 2012-05-25 ENCOUNTER — Encounter: Payer: Self-pay | Admitting: Cardiology

## 2012-05-25 ENCOUNTER — Ambulatory Visit (INDEPENDENT_AMBULATORY_CARE_PROVIDER_SITE_OTHER): Payer: Managed Care, Other (non HMO) | Admitting: Cardiology

## 2012-05-25 VITALS — BP 150/80 | HR 57 | Ht 76.0 in | Wt 221.0 lb

## 2012-05-25 DIAGNOSIS — I2510 Atherosclerotic heart disease of native coronary artery without angina pectoris: Secondary | ICD-10-CM

## 2012-05-25 DIAGNOSIS — I10 Essential (primary) hypertension: Secondary | ICD-10-CM

## 2012-05-25 DIAGNOSIS — I4891 Unspecified atrial fibrillation: Secondary | ICD-10-CM

## 2012-05-25 NOTE — Patient Instructions (Signed)
Your physician recommends that you schedule a follow-up appointment in: 4 WEEKS with Dr Excell Seltzer (previous pt of Dr Riley Kill)  Your physician recommends that you continue on your current medications as directed. Please refer to the Current Medication list given to you today.

## 2012-05-25 NOTE — Progress Notes (Signed)
HPI:  The patient comes in for followup today. Last week, the patient had an episode of chest discomfort. It was moderately severe. He took 2 nitroglycerin, and the discomfort was still bothering him the next morning. He ultimately called his primary care doctor who suggested that he should be in the emergency room. The symptoms resolved since, and the patient did not go to the emergency room. He's been feeling back to normal in the interim.   His legs of course to bother him. Current Outpatient Prescriptions  Medication Sig Dispense Refill  . colchicine 0.6 MG tablet Take 0.6 mg by mouth daily as needed. For gout symptoms      . furosemide (LASIX) 20 MG tablet Take 1 tablet (20 mg total) by mouth daily.  30 tablet  3  . glipiZIDE (GLUCOTROL) 5 MG tablet Take 5 mg by mouth 2 (two) times daily before a meal.        . lisinopril (PRINIVIL,ZESTRIL) 40 MG tablet Take 40 mg by mouth daily.      . metFORMIN (GLUCOPHAGE) 500 MG tablet Take 500 mg by mouth 2 (two) times daily with a meal. May take 250mg  twice daily if blood sugar is already low      . metoprolol (LOPRESSOR) 50 MG tablet Take 0.5 tablets (25 mg total) by mouth 2 (two) times daily.  1 tablet  0  . nisoldipine (SULAR) 17 MG 24 hr tablet Take 1 tablet (17 mg total) by mouth daily.  30 tablet  6  . nitroGLYCERIN (NITROSTAT) 0.4 MG SL tablet Place 0.4 mg under the tongue every 5 (five) minutes as needed. For chest pain      . oxyCODONE-acetaminophen (PERCOCET/ROXICET) 5-325 MG per tablet Take 2 tablets by mouth every 4 (four) hours as needed for pain.  6 tablet  0  . Rivaroxaban (XARELTO) 20 MG TABS Take 20 mg by mouth daily with supper.       No current facility-administered medications for this visit.    No Known Allergies  Past Medical History  Diagnosis Date  . Hypertension   . Diabetes mellitus   . CAD (coronary artery disease)     LHC 7/13: mOM1 50-75%, ostial subbranch 80-90% (very small), PL branch 80-90% (very small), mRCA  40-50%, EF 30% => med Rx for small vessel disease  . Atrial fibrillation     CHADS2=3; Xarelto started 7/13; s/p TEE-DCCV => NSR;  patient back in AF with RVR at f/u 8/13 => amiodarone started => back in NSR 9/13 f/u OV  . Ischemic cardiomyopathy     echo 7/13: mild LVH, EF 25-30%, global HK with severe Hk to AK of basal to mid inf and post walls, severe diast dysfxn, mild MR, mod LAE, mod reduced RV fxn, mod RAE, PASP 50 (mild to mod pulmo HTN);  TEE 7/13 with EF 30-35%  . Epistaxis 08/2011    On heparin and Coumadin s/p silver nitrate cauterization L nare  . Hyperlipidemia   . Chronic combined systolic and diastolic CHF (congestive heart failure)   . Gout   . Tobacco abuse   . Alcohol abuse   . MI (myocardial infarction)     Past Surgical History  Procedure Laterality Date  . Tonsillectomy    . Left thumb surgery    . Adenoidectomy    . Tee without cardioversion  09/15/2011    Procedure: TRANSESOPHAGEAL ECHOCARDIOGRAM (TEE);  Surgeon: Lewayne Bunting, MD;  Location: [facility] ENDOSCOPY;  Service: Cardiovascular;  Laterality: N/A;  .  Cardioversion  09/15/2011    Procedure: CARDIOVERSION;  Surgeon: Lewayne Bunting, MD;  Location: [facility] ENDOSCOPY;  Service: Cardiovascular;  Laterality: N/A;  . Cardiac catheterization  09/09/2011    a) widely patent left main with minor luminal irregularities, patent LAD, mild prox LAD calcification and luminal irregularities, patent D1, diffuse prox and LCX- mid irregularity, 50-75% mid OM1 stenosis, 80-90% ostial stenosis in small subbranch, small PLB 80-90% stenosis, RCA- mild proximal luminal irregularity, mid 40-50% stenosis, distal irregularity, PDA and PLB patent; LVEF 30% and global L    Family History  Problem Relation Age of Onset  . Diabetes type II Mother   . Hypertension Father     History   Social History  . Marital Status: Single    Spouse Name: N/A    Number of Children: N/A  . Years of Education: N/A   Occupational History  . Not on  file.   Social History Main Topics  . Smoking status: Former Smoker -- 0.50 packs/day for 2 years    Types: Cigarettes  . Smokeless tobacco: Never Used  . Alcohol Use: 3.0 oz/week    6 drink(s) per week     Comment: rarely  . Drug Use: No  . Sexually Active: No   Other Topics Concern  . Not on file   Social History Narrative  . No narrative on file    ROS: Please see the HPI.  All other systems reviewed and negative.  PHYSICAL EXAM:  BP 150/80  Pulse 57  Ht 6\' 4"  (1.93 m)  Wt 221 lb (100.245 kg)  BMI 26.91 kg/m2  SpO2 93%  General: Well developed, well nourished, in no acute distress. Head:  Normocephalic and atraumatic. Neck: no JVD Lungs: Clear to auscultation and percussion. Heart: Normal S1 and S2.  No murmur, rubs or gallops.  Abdomen:  Normal bowel sounds; soft; non tender; no organomegaly Pulses: Pulses normal in all 4 extremities. Extremities: No clubbing or cyanosis. No edema. Neurologic: Alert and oriented x 3.    EKG:  SB.  Inferior and lateral MI, with T inversions in II, III, AVF, and V4-V6 more prominent than the last tracing.  CATH STUDY    AO 139/97  LV 139/29  Coronary angiography:  Coronary dominance: right  Left mainstem: Widely patent with minor luminal irregularity.  Left anterior descending (LAD): Patent to the left ventricular apex. The first diagonal branch is patent. There is mild proximal LAD calcification and also mild luminal irregularity but there are no significant stenoses throughout the course of the LAD distribution.  Left circumflex (LCx): The left circumflex is a large-caliber vessel. The proximal and mid circumflex have mild diffuse irregularity. The first OM is patent but in the midportion of this vessel there is 50-75% stenosis. There is also a subbranch that has tight ostial stenosis but this is a very small vessel. The percent stenosis is estimated at 80-90. The continuation of the AV groove circumflex supplies a  posterolateral branch which also has 80-90% stenosis. This is a very small vessel less than 1.5 mm in caliber.  Right coronary artery (RCA): The right coronary artery is dominant. There is mild luminal irregularity in the proximal vessel. The mid vessel has 40-50% stenosis. The distal vessel has luminal irregularity. The PDA and posterolateral branches are patent  Left ventriculography: The left ventricle is poorly visualized with ventriculography, despite a 30 cc injection. However, there is clearly global left ventricular dysfunction and I would estimate the ejection fraction in the  range of 30%.  Final Conclusions:  1. Distal vessel coronary artery disease primarily in the left circumflex distribution as detailed above  2. Nonobstructive LAD and right coronary artery stenoses  3. Severe global left ventricular dysfunction  Recommendations: Medical therapy. Plan will have to be made regarding anticoagulation in this patient with atrial fibrillation and cardiomyopathy, but this is complicated by concerns about medicine adherence and alcohol use.  Tonny Bollman  09/09/2011, 4:54 PM   ASSESSMENT AND PLAN:  I shared with him my concern regarding his EKG. He is more prominent T wave inversion in the inferior and lateral leads, suggesting the possibility that he had last week.  I discussed with him the possibility of doing a heart catheterization, but he was firmly against this. We also talked about coming back in a week to reassess his EKG, but he was concerned about the co-pay. I also discussed the possibility of adding a long-acting nitrate to his regimen, but he said he tried this before ca he has agreed to come back and be seen in about a month, and also if he has recurrent episodes of chest pain he is to go to the emergency room. used side effects.

## 2012-05-25 NOTE — Assessment & Plan Note (Addendum)
I shared with him my concern regarding his EKG. He is more prominent T wave inversion in the inferior and lateral leads, suggesting the possibility that he had an event  last week.  I discussed with him the possibility of doing a heart catheterization, but he was firmly against this. We also talked about coming back in a week to reassess his EKG, but he was concerned about the co-pay. I also discussed the possibility of adding a long-acting nitrate to his regimen, but he said he tried this before ca he has agreed to come back and be seen in about a month, and also if he has recurrent episodes of chest pain he is to go to the emergency room. used side effects.

## 2012-05-25 NOTE — Assessment & Plan Note (Signed)
He currently is in normal sinus rhythm fortunately.

## 2012-05-25 NOTE — Assessment & Plan Note (Signed)
This is slightly improved. He is taking his medicines on a regular basis.

## 2012-06-21 ENCOUNTER — Telehealth: Payer: Self-pay | Admitting: Cardiology

## 2012-06-21 NOTE — Telephone Encounter (Signed)
Dr.Stuckey Signed [employer] paperwork, Scanned In EPIC, LMOVM For Pt asking Him to  Return My call 06/21/12/KM

## 2012-06-21 NOTE — Telephone Encounter (Signed)
Pt Called back, I let Him know [employer] paperwork was Signed, He stated he will Pick up the Originals At [employer], I placed these at [employer] 06/21/12/KM

## 2012-06-29 ENCOUNTER — Ambulatory Visit: Payer: Managed Care, Other (non HMO) | Admitting: Cardiology

## 2012-07-06 ENCOUNTER — Ambulatory Visit (INDEPENDENT_AMBULATORY_CARE_PROVIDER_SITE_OTHER): Payer: Managed Care, Other (non HMO) | Admitting: Cardiovascular Disease

## 2012-07-06 ENCOUNTER — Encounter: Payer: Self-pay | Admitting: Cardiovascular Disease

## 2012-07-06 VITALS — BP 158/85 | HR 53 | Ht 76.0 in | Wt 224.0 lb

## 2012-07-06 DIAGNOSIS — I2510 Atherosclerotic heart disease of native coronary artery without angina pectoris: Secondary | ICD-10-CM

## 2012-07-06 DIAGNOSIS — I4891 Unspecified atrial fibrillation: Secondary | ICD-10-CM

## 2012-07-06 DIAGNOSIS — I10 Essential (primary) hypertension: Secondary | ICD-10-CM

## 2012-07-06 NOTE — Progress Notes (Signed)
HPI: 57 -year-old woman presenting for followup evaluation. He's been followed by Dr. Riley Kill. This is my first encounter with him, but I will be assuming his cardiac care from here forward. The patient has a history of hypertension, severe cardiomyopathy, and diffuse small vessel coronary artery disease. He's also had paroxysmal atrial fibrillation. He has fairly severe complications from diabetes including severe peripheral neuropathy. In review of his records, he was in and out of atrial fibrillation in 2013. He was on amiodarone, but no longer takes this. He was anticoagulated with Coumadin as well as Xarelto, but these have been discontinued. He's had epistaxis and other bleeding problems. He is not interested in taking any other anticoagulant drugs. He stopped Xarelto on his own a few months ago. His most recent heart catheterization was in July 2013. This demonstrated distal vessel CAD, primarily in the left circumflex distribution. There was nonobstructive disease in the LAD and right coronary arteries. He was noted to have severe LV dysfunction. Medical therapy was recommended.  He denies chest pain or dyspnea. He is inactive. He's not able to do much because of his neuropathy. He denies orthopnea or PND. He does complain of swelling in his feet.    Outpatient Encounter Prescriptions as of 07/06/2012  Medication Sig Dispense Refill  . colchicine 0.6 MG tablet Take 0.6 mg by mouth daily as needed. For gout symptoms      . glipiZIDE (GLUCOTROL) 5 MG tablet Take 5 mg by mouth 2 (two) times daily before a meal.        . lisinopril (PRINIVIL,ZESTRIL) 40 MG tablet Take 40 mg by mouth daily.      . metFORMIN (GLUCOPHAGE) 500 MG tablet Take 500 mg by mouth 2 (two) times daily with a meal. May take 250mg  twice daily if blood sugar is already low      . metoprolol (LOPRESSOR) 50 MG tablet Take 0.5 tablets (25 mg total) by mouth 2 (two) times daily.  1 tablet  0  . nisoldipine (SULAR) 17 MG 24 hr  tablet Take 1 tablet (17 mg total) by mouth daily.  30 tablet  6  . nitroGLYCERIN (NITROSTAT) 0.4 MG SL tablet Place 0.4 mg under the tongue every 5 (five) minutes as needed. For chest pain      . oxyCODONE-acetaminophen (PERCOCET/ROXICET) 5-325 MG per tablet Take 2 tablets by mouth every 4 (four) hours as needed for pain.  6 tablet  0  . furosemide (LASIX) 20 MG tablet Take 1 tablet (20 mg total) by mouth daily.  30 tablet  3  . [DISCONTINUED] Rivaroxaban (XARELTO) 20 MG TABS Take 20 mg by mouth daily with supper.       No facility-administered encounter medications on file as of 07/06/2012.    No Known Allergies  Past Medical History  Diagnosis Date  . Hypertension   . Diabetes mellitus   . CAD (coronary artery disease)     LHC 7/13: mOM1 50-75%, ostial subbranch 80-90% (very small), PL branch 80-90% (very small), mRCA 40-50%, EF 30% => med Rx for small vessel disease  . Atrial fibrillation     CHADS2=3; Xarelto started 7/13; s/p TEE-DCCV => NSR;  patient back in AF with RVR at f/u 8/13 => amiodarone started => back in NSR 9/13 f/u OV  . Ischemic cardiomyopathy     echo 7/13: mild LVH, EF 25-30%, global HK with severe Hk to AK of basal to mid inf and post walls, severe diast dysfxn, mild MR, mod LAE,  mod reduced RV fxn, mod RAE, PASP 50 (mild to mod pulmo HTN);  TEE 7/13 with EF 30-35%  . Epistaxis 08/2011    On heparin and Coumadin s/p silver nitrate cauterization L nare  . Hyperlipidemia   . Chronic combined systolic and diastolic CHF (congestive heart failure)   . Gout   . Tobacco abuse   . Alcohol abuse   . MI (myocardial infarction)     ROS: Negative except as per HPI  BP 158/85  Pulse 53  Ht 6\' 4"  (1.93 m)  Wt 101.606 kg (224 lb)  BMI 27.28 kg/m2  PHYSICAL EXAM: Pt is alert and oriented, NAD HEENT: normal Neck: JVP - normal, carotids 2+= without bruits Lungs: CTA bilaterally CV: Bradycardic and regular without murmur or gallop Abd: soft, NT, Positive BS, no  hepatomegaly Ext: no C/C/E Skin: warm/dry no rash  EKG:  Sinus bradycardia with PACs, age indeterminate inferior infarct.  ASSESSMENT AND PLAN: 1. Coronary artery disease, native vessel. The patient has no anginal symptoms. Will continue his current medical program. I asked him to start aspirin 81 mg daily.  2. Paroxysmal atrial fibrillation. He is maintaining sinus rhythm today. We discussed the risk/benefit of alternative anticoagulant drugs. He is not interested in trying any other medications. He is willing to start aspirin 81 mg daily. He understands that he has significant stroke risk with his diabetes, hypertension, and cardiomyopathy. Will continue his same medications.  3. Hypertension. He will continue on an ACE inhibitor, calcium channel blocker, and beta blocker.   For followup I will see him back in 6 months. His medication list has been updated to reflect what he is currently taking.  Tonny Bollman 07/06/2012 3:58 PM

## 2012-07-06 NOTE — Patient Instructions (Signed)
Your physician has recommended you make the following change in your medication: Xarelto and Furosemide have been removed from your medication list today, START ASPIRIN 81mg  take one by mouth daily  Your physician wants you to follow-up in: 6 MONTHS with Dr Excell Seltzer.  You will receive a reminder letter in the mail two months in advance. If you don't receive a letter, please call our office to schedule the follow-up appointment.

## 2012-07-08 ENCOUNTER — Telehealth: Payer: Self-pay | Admitting: Cardiovascular Disease

## 2012-07-08 NOTE — Telephone Encounter (Signed)
Sue Lush called asking about Status of Mr.Mcenery's Records, I let her Know 7 Pgs of Records Were Mailed to [employer] on 07/01/12. 07/08/12/KM

## 2012-08-02 ENCOUNTER — Other Ambulatory Visit: Payer: Self-pay | Admitting: Cardiology

## 2012-08-02 ENCOUNTER — Other Ambulatory Visit: Payer: Self-pay | Admitting: Physician Assistant

## 2012-08-03 ENCOUNTER — Other Ambulatory Visit: Payer: Self-pay

## 2012-08-03 DIAGNOSIS — I4891 Unspecified atrial fibrillation: Secondary | ICD-10-CM

## 2012-08-03 DIAGNOSIS — I10 Essential (primary) hypertension: Secondary | ICD-10-CM

## 2012-08-03 DIAGNOSIS — I2510 Atherosclerotic heart disease of native coronary artery without angina pectoris: Secondary | ICD-10-CM

## 2012-08-03 DIAGNOSIS — E785 Hyperlipidemia, unspecified: Secondary | ICD-10-CM

## 2012-08-03 DIAGNOSIS — I255 Ischemic cardiomyopathy: Secondary | ICD-10-CM

## 2012-08-03 MED ORDER — METOPROLOL TARTRATE 50 MG PO TABS: 25.0000 mg | ORAL_TABLET | Freq: Two times a day (BID) | ORAL | Status: DC

## 2012-08-03 MED ORDER — NISOLDIPINE ER 17 MG PO TB24: 17.0000 mg | ORAL_TABLET | Freq: Every day | ORAL | Status: DC

## 2012-08-03 NOTE — Telephone Encounter (Signed)
Refill Request      METOPROLOL 50 mg NISOLDIPINE 17 mg   To [facility]

## 2012-08-31 ENCOUNTER — Telehealth: Payer: Self-pay | Admitting: Cardiovascular Disease

## 2012-08-31 NOTE — Telephone Encounter (Signed)
New Prob  Pt needs to speak with you regarding his paperwork for his long term disability.

## 2012-08-31 NOTE — Telephone Encounter (Signed)
I spoke with the pt and he said that he received a letter from his disability company that they are reviewing his claim.  The pt said that [employer] has already contacted him that paperwork needs to be completed again.  The pt wanted to make our office aware and that he still cannot work due to neuropathy and difficulty walking. I will await form from [employer] for completion.

## 2012-09-07 ENCOUNTER — Telehealth: Payer: Self-pay | Admitting: Cardiovascular Disease

## 2012-09-07 NOTE — Telephone Encounter (Signed)
Per [employer] the pt's paperwork is in process. Timothy Olsen has already spoken with the pt and made him aware that they are working on his papers. The paperwork needs to be faxed into the insurance company ASAP per the pt's request and the pt would like a call when papers are faxed.

## 2012-09-07 NOTE — Telephone Encounter (Signed)
New problem    Pt calling to see if paper work for [employer] has been faxed-pt didn't really explain what he's needing just said couldn't get anything done when contacting [employer]

## 2012-09-08 ENCOUNTER — Encounter: Payer: Self-pay | Admitting: Cardiovascular Disease

## 2012-09-09 ENCOUNTER — Telehealth: Payer: Self-pay | Admitting: Cardiovascular Disease

## 2012-09-09 ENCOUNTER — Telehealth: Payer: Self-pay

## 2012-09-09 ENCOUNTER — Other Ambulatory Visit: Payer: Self-pay

## 2012-09-09 DIAGNOSIS — I2510 Atherosclerotic heart disease of native coronary artery without angina pectoris: Secondary | ICD-10-CM

## 2012-09-09 DIAGNOSIS — I255 Ischemic cardiomyopathy: Secondary | ICD-10-CM

## 2012-09-09 DIAGNOSIS — E785 Hyperlipidemia, unspecified: Secondary | ICD-10-CM

## 2012-09-09 DIAGNOSIS — I4891 Unspecified atrial fibrillation: Secondary | ICD-10-CM

## 2012-09-09 DIAGNOSIS — I10 Essential (primary) hypertension: Secondary | ICD-10-CM

## 2012-09-09 MED ORDER — METOPROLOL TARTRATE 50 MG PO TABS: 25.0000 mg | ORAL_TABLET | Freq: Two times a day (BID) | ORAL | Status: DC

## 2012-09-09 NOTE — Telephone Encounter (Signed)
Dr.Cooper Completed & Signed Physical Ability Assessment papers,Faxed to [employer] at [phone] LMOVM for pt to Return My Call 09/09/12/KM

## 2012-09-09 NOTE — Telephone Encounter (Signed)
I spoke with the Timothy Olsen because he told medical records he had a question about his medication. The Timothy Olsen states he has been taking Metoprolol Tartrate 50mg  one and one-half tablet twice a day and the pharmacy gave him a bottle that has 50mg  take one-half tablet by mouth twice a day. I made the Timothy Olsen aware that he was suppose to be taking 50mg  one-half tablet twice a day due to bradycardia. This change was made during 01/15/12 nurse visit. The Timothy Olsen states that the pharmacy has told him that he cannot get this medication filled again until later in August due to insurance.  I made the Timothy Olsen aware that he will have to pay out of pocket for this medication. The Timothy Olsen requested that Rx be sent to [facility] for $4 plan. The Timothy Olsen is adamant that he needs more medication because of his elevated BP.  I instructed the Timothy Olsen to take his medications as prescribed and monitor his BP at home.  The Timothy Olsen has multiple drug intolerances and he frequently adjusts his medications on his own. The Timothy Olsen will call the office with BP readings if they remain elevated.

## 2012-09-09 NOTE — Telephone Encounter (Signed)
Pt called to Pick up [employer] paperwork, Paperwork picked up By [employer] paperwork Was Picked up  Pt called back into office telling me He needed Dates & Amount paid to [employer] every time it was Paid and The Date It was Paid On he stated he Received a Letter From his Lawyer stating he could Be reimbursed for all the Money hes Paid Out  To [employer]. Called and spoke with Patsy/[employer] she stated shes already spoke with Pt and she was working On getting The Information for Mr.Polio. Spoke with Me.Driscoll hes aware [employer] is working On this 09/09/12/KM

## 2012-09-09 NOTE — Telephone Encounter (Signed)
Pt called back hes aware [employer] Forms ready For Pick up 09/09/12/KM

## 2012-10-05 ENCOUNTER — Telehealth: Payer: Self-pay | Admitting: Cardiovascular Disease

## 2012-10-05 NOTE — Telephone Encounter (Signed)
I spoke with the pt and he needs his recent paperwork corrected.  The corrected forms should be faxed to Attn: Ilona Sorrel at [facility] fax #[phone], phone #[phone], ext [phone].  The pt would also like to pick up a corrected copy from the front desk.

## 2012-10-05 NOTE — Telephone Encounter (Signed)
New problem   Pt need to speak to nurse concerning some forms that was filled out by doctor and sent to [facility]. Please call pt.

## 2012-10-05 NOTE — Telephone Encounter (Signed)
Paperwork corrected by Dr Excell Seltzer. I attempted to fax form to Alicia's number but the fax would not go through.  I did fax paperwork to [employer] at [phone], copy placed at the front desk for pt pick-up. I left a detailed message on the pt's voicemail with this information.

## 2012-10-06 ENCOUNTER — Telehealth: Payer: Self-pay | Admitting: Cardiovascular Disease

## 2012-10-06 NOTE — Telephone Encounter (Signed)
New Problem  Pt is calling back and also needs a statement sent to the insurance company saying that his heartbeat is going in and out of rhythm.

## 2012-10-06 NOTE — Telephone Encounter (Signed)
Spoke with patient who needs additional statement stating that his heart goes "in and out of rhythm" faxed to [employer].  Patient states this is in addition to paperwork faxed by Julieta Gutting, RN on 8/26. I pulled patient's paperwork from front desk.  Patient states once this information is faxed to [facility] he will pick up copies of all paperwork.  I advised patient that Leotis Shames is out of the office today and will return tomorrow (8/28).  Patient verbalized understanding.

## 2012-10-07 ENCOUNTER — Encounter: Payer: Self-pay | Admitting: Cardiovascular Disease

## 2012-10-07 NOTE — Telephone Encounter (Signed)
This encounter was created in error - please disregard.

## 2012-10-07 NOTE — Telephone Encounter (Signed)
I spoke with the pt and made him aware that I will fax his 07/06/12 office note to [facility]. In this note it documents that the pt has a history of PAF. Paperwork will be placed at the front desk again for pick-up.

## 2012-10-07 NOTE — Telephone Encounter (Signed)
Pt had office visit 07-06-12  And copy was sent to insurance company, they will not cover this visit due to the letter stating pt is a 57 year old woman, pt would like this corrected and faxed to [place] at [phone]

## 2012-10-07 NOTE — Telephone Encounter (Signed)
Follow up   Pt need you to call her pertaining to prior message. Please call pt

## 2012-10-25 ENCOUNTER — Telehealth: Payer: Self-pay | Admitting: Nurse Practitioner

## 2012-10-25 MED ORDER — AMLODIPINE BESYLATE 10 MG PO TABS: 10.0000 mg | ORAL_TABLET | Freq: Every day | ORAL | Status: DC

## 2012-10-25 NOTE — Telephone Encounter (Signed)
Spoke with Dr [place] (DOD) regarding substituting Norvasc for Nisoldipine 17 mg, he states that will be fine just need to find comparable dose of norvasc, will route note to Dr Excell Seltzer primary cardiologist regarding change in medication. Pharmacist states per hx of B/P readings start Norvasc 10 mg daily.

## 2012-10-25 NOTE — Telephone Encounter (Signed)
Patient called to report that he is out of Nisoldipine 17 mg and the medication is very expensive ($160+).  Patient states pharmacist at [facility] told him that he could Norvasc in its place. Patient would like Rx for Norvasc sent to [facility]. I advised patient that I will have to get order from Dr. Excell Seltzer for change in medication therapy and will call him back regarding Dr. Earmon Phoenix advice. I am routing message to Addison Lank, RN Patient Advocate for medications/refills.

## 2012-12-15 ENCOUNTER — Other Ambulatory Visit: Payer: Self-pay

## 2012-12-15 MED ORDER — LISINOPRIL 40 MG PO TABS: 40.0000 mg | ORAL_TABLET | Freq: Every day | ORAL | Status: DC

## 2012-12-15 MED ORDER — AMLODIPINE BESYLATE 10 MG PO TABS: 10.0000 mg | ORAL_TABLET | Freq: Every day | ORAL | Status: DC

## 2013-01-19 ENCOUNTER — Other Ambulatory Visit: Payer: Self-pay | Admitting: Cardiovascular Disease

## 2013-01-24 ENCOUNTER — Other Ambulatory Visit: Payer: Self-pay

## 2013-01-24 ENCOUNTER — Telehealth: Payer: Self-pay

## 2013-01-24 MED ORDER — METOPROLOL TARTRATE 50 MG PO TABS: ORAL_TABLET | ORAL | Status: DC

## 2013-01-26 NOTE — Telephone Encounter (Signed)
Patient wants to know if he can take an extra lisinopril in the evening at the same time he takes his metoprolol. Please advise. Thanks, MI

## 2013-01-26 NOTE — Telephone Encounter (Signed)
Follow Up:  Pt is faxing over a document he needs the doctor to sign for his lawyer. Pt is requesting Lauren call him back.

## 2013-01-27 NOTE — Telephone Encounter (Signed)
I spoke with the pt and he states that his PCP instructed him to take an extra Lisinopril 20mg  at night if his BP is elevated.  The pt does monitor his BP and pulse but does not write them down.  I made the pt aware that at this time the best way for our office to help adjust his medications is to keep a BP and pulse diary and bring this to his upcoming appointment. The pt agreed with plan and I further advised him that he needs to bring all of his medication bottles into the office so that we have a correct list.    Dr Excell Seltzer also signed letter from [employer] and I will fax this to [phone] per the pt's request.

## 2013-01-29 ENCOUNTER — Encounter (HOSPITAL_COMMUNITY): Payer: Self-pay | Admitting: Emergency Medicine

## 2013-01-29 ENCOUNTER — Emergency Department (HOSPITAL_COMMUNITY): Payer: BC Managed Care – PPO

## 2013-01-29 ENCOUNTER — Emergency Department (HOSPITAL_COMMUNITY)
Admission: EM | Admit: 2013-01-29 | Discharge: 2013-01-29 | Disposition: A | Discharge status: Home / Self Care | Payer: BC Managed Care – PPO | Attending: Emergency Medicine | Admitting: Emergency Medicine

## 2013-01-29 DIAGNOSIS — R0789 Other chest pain: Secondary | ICD-10-CM | POA: Insufficient documentation

## 2013-01-29 DIAGNOSIS — Z79899 Other long term (current) drug therapy: Secondary | ICD-10-CM | POA: Insufficient documentation

## 2013-01-29 DIAGNOSIS — I252 Old myocardial infarction: Secondary | ICD-10-CM | POA: Insufficient documentation

## 2013-01-29 DIAGNOSIS — Z95818 Presence of other cardiac implants and grafts: Secondary | ICD-10-CM | POA: Insufficient documentation

## 2013-01-29 DIAGNOSIS — I10 Essential (primary) hypertension: Secondary | ICD-10-CM | POA: Insufficient documentation

## 2013-01-29 DIAGNOSIS — I2510 Atherosclerotic heart disease of native coronary artery without angina pectoris: Secondary | ICD-10-CM | POA: Insufficient documentation

## 2013-01-29 DIAGNOSIS — I5042 Chronic combined systolic (congestive) and diastolic (congestive) heart failure: Secondary | ICD-10-CM | POA: Insufficient documentation

## 2013-01-29 DIAGNOSIS — Z87891 Personal history of nicotine dependence: Secondary | ICD-10-CM | POA: Insufficient documentation

## 2013-01-29 DIAGNOSIS — Z7982 Long term (current) use of aspirin: Secondary | ICD-10-CM | POA: Insufficient documentation

## 2013-01-29 DIAGNOSIS — E119 Type 2 diabetes mellitus without complications: Secondary | ICD-10-CM | POA: Insufficient documentation

## 2013-01-29 DIAGNOSIS — I4891 Unspecified atrial fibrillation: Secondary | ICD-10-CM | POA: Insufficient documentation

## 2013-01-29 DIAGNOSIS — M109 Gout, unspecified: Secondary | ICD-10-CM | POA: Insufficient documentation

## 2013-01-29 LAB — CBC WITH DIFFERENTIAL/PLATELET
Eosinophils Absolute: 0 10*3/uL (ref 0.0–0.7)
Eosinophils Relative: 0 % (ref 0–5)
HCT: 47.8 % (ref 39.0–52.0)
Lymphocytes Relative: 29 % (ref 12–46)
Lymphs Abs: 2.5 10*3/uL (ref 0.7–4.0)
MCH: 28.7 pg (ref 26.0–34.0)
MCV: 85.8 fL (ref 78.0–100.0)
Monocytes Absolute: 0.6 10*3/uL (ref 0.1–1.0)
Neutro Abs: 5.3 10*3/uL (ref 1.7–7.7)
Platelets: 267 10*3/uL (ref 150–400)
RBC: 5.57 MIL/uL (ref 4.22–5.81)

## 2013-01-29 LAB — BASIC METABOLIC PANEL
BUN: 16 mg/dL (ref 6–23)
Calcium: 9.9 mg/dL (ref 8.4–10.5)
Chloride: 101 mEq/L (ref 96–112)
Creatinine, Ser: 0.91 mg/dL (ref 0.50–1.35)
GFR calc non Af Amer: >90 mL/min (ref >90)
Glucose, Bld: 103 mg/dL — ABNORMAL HIGH (ref 70–99)
Sodium: 140 mEq/L (ref 135–145)

## 2013-01-29 MED ORDER — ASPIRIN 81 MG PO CHEW
81.0000 mg | CHEWABLE_TABLET | Freq: Once | ORAL | Status: AC
  Administered 2013-01-29: 81 mg via ORAL

## 2013-01-29 MED ORDER — HYDROCODONE-ACETAMINOPHEN 5-325 MG PO TABS: 2.0000 | ORAL_TABLET | Freq: Four times a day (QID) | ORAL | Status: DC | PRN

## 2013-01-29 MED ORDER — ASPIRIN 81 MG PO CHEW
324.0000 mg | CHEWABLE_TABLET | Freq: Once | ORAL | Status: DC
  Filled 2013-01-29: qty 4

## 2013-01-29 NOTE — ED Notes (Signed)
Pt refusing ASA.  States he took two 81mg  ASA this morning.  States he will take one tab now.  MD aware.  Gave pt ASA 81 mg chewable x 1.

## 2013-01-29 NOTE — ED Notes (Signed)
Pt. Stated, I started having some CP about 2 days ago, i should have come then , but i didn't and its not going away.I took 2 Nitro yesterday and 2 the day before. It helped a little.

## 2013-01-29 NOTE — ED Provider Notes (Signed)
CSN: [id number]     Arrival date & time 01/29/13  1206 History   First MD Initiated Contact with Patient 01/29/13 1214     Chief Complaint  Patient presents with  . Chest Pain   (Consider location/radiation/quality/duration/timing/severity/associated sxs/prior Treatment) Patient is a 57 y.o. male presenting with chest pain.  Chest Pain  Pt with history of CAD, small vessel, not amenable to intervention and managed medically based on cath done 7/13 reports he was bending over 2 days ago and had sudden onset of 'spasm' pain in L lateral abdomen and radiating across his chest wall. He reports pain has been waxing and waning since then, worse with deep breath and improved with hydrocodone he takes at home. He denies fever or cough. No leg swelling. Did not come in initially because he thought it was 'gas pains'.   Past Medical History  Diagnosis Date  . Hypertension   . Diabetes mellitus   . CAD (coronary artery disease)     LHC 7/13: mOM1 50-75%, ostial subbranch 80-90% (very small), PL branch 80-90% (very small), mRCA 40-50%, EF 30% => med Rx for small vessel disease  . Atrial fibrillation     CHADS2=3; Xarelto started 7/13; s/p TEE-DCCV => NSR;  patient back in AF with RVR at f/u 8/13 => amiodarone started => back in NSR 9/13 f/u OV  . Ischemic cardiomyopathy     echo 7/13: mild LVH, EF 25-30%, global HK with severe Hk to AK of basal to mid inf and post walls, severe diast dysfxn, mild MR, mod LAE, mod reduced RV fxn, mod RAE, PASP 50 (mild to mod pulmo HTN);  TEE 7/13 with EF 30-35%  . Epistaxis 08/2011    On heparin and Coumadin s/p silver nitrate cauterization L nare  . Hyperlipidemia   . Chronic combined systolic and diastolic CHF (congestive heart failure)   . Gout   . Tobacco abuse   . Alcohol abuse   . MI (myocardial infarction)    Past Surgical History  Procedure Laterality Date  . Tonsillectomy    . Left thumb surgery    . Adenoidectomy    . Tee without cardioversion   09/15/2011    Procedure: TRANSESOPHAGEAL ECHOCARDIOGRAM (TEE);  Surgeon: Lewayne Bunting, MD;  Location: [facility] ENDOSCOPY;  Service: Cardiovascular;  Laterality: N/A;  . Cardioversion  09/15/2011    Procedure: CARDIOVERSION;  Surgeon: Lewayne Bunting, MD;  Location: [facility] ENDOSCOPY;  Service: Cardiovascular;  Laterality: N/A;  . Cardiac catheterization  09/09/2011    a) widely patent left main with minor luminal irregularities, patent LAD, mild prox LAD calcification and luminal irregularities, patent D1, diffuse prox and LCX- mid irregularity, 50-75% mid OM1 stenosis, 80-90% ostial stenosis in small subbranch, small PLB 80-90% stenosis, RCA- mild proximal luminal irregularity, mid 40-50% stenosis, distal irregularity, PDA and PLB patent; LVEF 30% and global L   Family History  Problem Relation Age of Onset  . Diabetes type II Mother   . Hypertension Father    History  Substance Use Topics  . Smoking status: Former Smoker -- 0.50 packs/day for 2 years    Types: Cigarettes  . Smokeless tobacco: Never Used  . Alcohol Use: 3.0 oz/week    6 drink(s) per week     Comment: rarely    Review of Systems  Cardiovascular: Positive for chest pain.   All other systems reviewed and are negative except as noted in HPI.   Allergies  Review of patient's allergies indicates no known allergies.  Home Medications   Current Outpatient Rx  Name  Route  Sig  Dispense  Refill  . amLODipine (NORVASC) 10 MG tablet   Oral   Take 1 tablet (10 mg total) by mouth daily.   30 tablet   2     New med changed from nisoldipine to norvasc, check ...   . aspirin EC 81 MG tablet   Oral   Take 1 tablet (81 mg total) by mouth daily.   1 tablet   0   . colchicine 0.6 MG tablet   Oral   Take 0.6 mg by mouth daily as needed. For gout symptoms         . glipiZIDE (GLUCOTROL) 5 MG tablet   Oral   Take 5 mg by mouth 2 (two) times daily before a meal.           . lisinopril (PRINIVIL,ZESTRIL) 40 MG tablet    Oral   Take 1 tablet (40 mg total) by mouth daily.   30 tablet   2   . metFORMIN (GLUCOPHAGE) 500 MG tablet   Oral   Take 500 mg by mouth 2 (two) times daily with a meal. May take 250mg  twice daily if blood sugar is already low         . metoprolol (LOPRESSOR) 50 MG tablet      TAKE ONE-HALF TABLET BY MOUTH TWICE DAILY   30 tablet   0   . nitroGLYCERIN (NITROSTAT) 0.4 MG SL tablet   Sublingual   Place 0.4 mg under the tongue every 5 (five) minutes as needed. For chest pain         . oxyCODONE-acetaminophen (PERCOCET/ROXICET) 5-325 MG per tablet   Oral   Take 2 tablets by mouth every 4 (four) hours as needed for pain.   6 tablet   0    BP 155/68  Pulse 53  Temp(Src) 97.3 F (36.3 C) (Oral)  Resp 18  SpO2 99% Physical Exam  Nursing note and vitals reviewed. Constitutional: He is oriented to person, place, and time. He appears well-developed and well-nourished.  HENT:  Head: Normocephalic and atraumatic.  Eyes: EOM are normal. Pupils are equal, round, and reactive to light.  Neck: Normal range of motion. Neck supple.  Cardiovascular: Normal rate, normal heart sounds and intact distal pulses.   Pulmonary/Chest: Effort normal and breath sounds normal. He exhibits tenderness (bilateral lower chest wall).  Abdominal: Bowel sounds are normal. He exhibits no distension. There is no tenderness.  Musculoskeletal: Normal range of motion. He exhibits no edema and no tenderness.  Neurological: He is alert and oriented to person, place, and time. He has normal strength. No cranial nerve deficit or sensory deficit.  Skin: Skin is warm and dry. No rash noted.  Psychiatric: He has a normal mood and affect.    ED Course  Procedures (including critical care time) Labs Review Labs Reviewed  BASIC METABOLIC PANEL - Abnormal; Notable for the following:    Glucose, Bld 103 (*)    All other components within normal limits  CBC WITH DIFFERENTIAL  TROPONIN I   Imaging Review Dg  Chest 2 View  01/29/2013   CLINICAL DATA:  Three day history of left-sided chest discomfort, history of coronary artery disease and previous MI  EXAM: CHEST  2 VIEW  COMPARISON:  PA and lateral chest x-ray of November 08, 2011, and to a study of September 08, 2011.  FINDINGS: The lungs are well-expanded. There is increased density in  the right infrahilar region that likely reflects scarring. The cardiopericardial silhouette is normal in size. The mediastinum is normal in width. The pulmonary vascularity is not engorged. There is no pleural effusion or pneumothorax. The observed portions of the bony thorax appear normal.  IMPRESSION: There is no evidence of pneumonia nor CHF or other acute cardiopulmonary abnormality.   Electronically Signed   By: David  [place]   On: 01/29/2013 13:25      Date: 01/29/2013  Rate: 52   Rhythm: sinus bradycardia  With PAC in bigeminy  QRS Axis: normal  Intervals: normal  ST/T Wave abnormalities: nonspecific T wave changes  Conduction Disutrbances:none  Narrative Interpretation:   Old EKG Reviewed: unchanged    MDM   1. Atypical chest pain     Labs and imaging reviewed. Pt's pain is atypical, ongoing for 2 days with normal Trop here. Discussed admission for further eval, but patient wants to go home. He has follow up already scheduled with Cards in about 2 weeks. Advised to call office if symptoms worsen.     Charles B. Bernette Mayers, MD 01/29/13 [phone]

## 2013-01-29 NOTE — ED Notes (Signed)
Patient transported to X-ray 

## 2013-01-31 ENCOUNTER — Other Ambulatory Visit: Payer: Self-pay

## 2013-01-31 DIAGNOSIS — I2510 Atherosclerotic heart disease of native coronary artery without angina pectoris: Secondary | ICD-10-CM

## 2013-01-31 MED ORDER — NITROGLYCERIN 0.4 MG SL SUBL: 0.4000 mg | SUBLINGUAL_TABLET | SUBLINGUAL | Status: DC | PRN

## 2013-02-01 ENCOUNTER — Telehealth: Payer: Self-pay | Admitting: Cardiovascular Disease

## 2013-02-01 NOTE — Telephone Encounter (Signed)
I spoke with the pt and made him aware that he is currently taking Amlodipine which treats BP and coronary spasm.The pt initially said that he does not take this medication because it made his pulse slow and I advised him that this is in correct and that Amiodarone is the medication that was stopped. The pt then confirmed that he is still taking Amlodipine.  I made the pt aware that he would use SL NTG for break through CP. The pt said his PCP told him he needs to be taking something for spasm.  I once again made him aware that he is already on Amlodipine.  The pt said his pharmacist told him he can take Isosorbide as needed for spasm.  The pt has taken this medication in the past but he stopped it because of side effects.  I made the pt aware that this is not an as needed medication and that it should be taken on a daily basis to control angina.  I advised him that he uses SL NTG as needed for angina. The pt continued to argue with me over the phone and I made him aware that at this time he needs to continue his current medical regimen and keep his follow-up appointment with Dr Excell Seltzer on 02/16/13. I also told the pt that he should be checking his BP and pulse at home and keeping a record for his upcoming appointment so that his medications can be adjusted.

## 2013-02-01 NOTE — Telephone Encounter (Signed)
New Message  Pt states that his PCP is asking why wasn't he given medication for his spasms..Pt states that he still has spasms from time to time.  Pt requests a call back to discuss.

## 2013-02-16 ENCOUNTER — Ambulatory Visit (INDEPENDENT_AMBULATORY_CARE_PROVIDER_SITE_OTHER): Payer: BC Managed Care – PPO | Admitting: Cardiovascular Disease

## 2013-02-16 ENCOUNTER — Encounter: Payer: Self-pay | Admitting: Cardiovascular Disease

## 2013-02-16 VITALS — BP 128/76 | HR 60 | Resp 18

## 2013-02-16 DIAGNOSIS — I10 Essential (primary) hypertension: Secondary | ICD-10-CM

## 2013-02-16 MED ORDER — HYDRALAZINE HCL 25 MG PO TABS: 25.0000 mg | ORAL_TABLET | Freq: Two times a day (BID) | ORAL | Status: DC

## 2013-02-16 NOTE — Progress Notes (Signed)
HPI:  58 year old gentleman presenting for followup evaluation. He is followed for cardiomyopathy, hypertension, and diffuse small vessel coronary artery disease. He is limited by peripheral neuropathy related to long-standing diabetes. He had atrial fibrillation back in 2013 but was unable to tolerate any anticoagulant medications. He was tried on Coumadin and Xarelto. He has not been interested in trying any other anticoagulant drugs because of bleeding problems and other side effects that he experienced.  He's doing fairly well from a cardiac perspective. He is primarily limited physically by severe peripheral neuropathy. He denies dyspnea, edema, or palpitations. He does have occasional chest pain but overall this is been control of late. He notes elevated blood pressures in the afternoon and brings in home readings in the 150s over 90s with occasional diastolic blood pressures up to 100.  Outpatient Encounter Prescriptions as of 02/16/2013  Medication Sig  . amLODipine (NORVASC) 10 MG tablet Take 1 tablet (10 mg total) by mouth daily.  Marland Kitchen aspirin EC 81 MG tablet Take 1 tablet (81 mg total) by mouth daily.  . colchicine 0.6 MG tablet Take 0.6 mg by mouth daily as needed. For gout symptoms  . glipiZIDE (GLUCOTROL) 5 MG tablet Take 5 mg by mouth 2 (two) times daily before a meal.    . HYDROcodone-acetaminophen (NORCO/VICODIN) 5-325 MG per tablet Take 2 tablets by mouth every 6 (six) hours as needed.  Marland Kitchen lisinopril (PRINIVIL,ZESTRIL) 40 MG tablet Take 1 tablet (40 mg total) by mouth daily.  . metFORMIN (GLUCOPHAGE) 500 MG tablet Take 500 mg by mouth 2 (two) times daily with a meal. May take 250mg  twice daily if blood sugar is already low  . metoprolol (LOPRESSOR) 50 MG tablet TAKE ONE-HALF TABLET BY MOUTH TWICE DAILY  . nitroGLYCERIN (NITROSTAT) 0.4 MG SL tablet Place 1 tablet (0.4 mg total) under the tongue every 5 (five) minutes as needed. For chest pain    No Known Allergies  Past Medical  History  Diagnosis Date  . Hypertension   . Diabetes mellitus   . CAD (coronary artery disease)     LHC 7/13: mOM1 50-75%, ostial subbranch 80-90% (very small), PL branch 80-90% (very small), mRCA 40-50%, EF 30% => med Rx for small vessel disease  . Atrial fibrillation     CHADS2=3; Xarelto started 7/13; s/p TEE-DCCV => NSR;  patient back in AF with RVR at f/u 8/13 => amiodarone started => back in NSR 9/13 f/u OV  . Ischemic cardiomyopathy     echo 7/13: mild LVH, EF 25-30%, global HK with severe Hk to AK of basal to mid inf and post walls, severe diast dysfxn, mild MR, mod LAE, mod reduced RV fxn, mod RAE, PASP 50 (mild to mod pulmo HTN);  TEE 7/13 with EF 30-35%  . Epistaxis 08/2011    On heparin and Coumadin s/p silver nitrate cauterization L nare  . Hyperlipidemia   . Chronic combined systolic and diastolic CHF (congestive heart failure)   . Gout   . Tobacco abuse   . Alcohol abuse   . MI (myocardial infarction)     ROS: Negative except as per HPI  BP 128/76  Pulse 60  Resp 18  PHYSICAL EXAM: Pt is alert and oriented, NAD HEENT: normal Neck: JVP - normal, carotids 2+= without bruits Lungs: CTA bilaterally CV: RRR without murmur or gallop Abd: soft, NT, Positive BS, no hepatomegaly Ext: no C/C/E, distal pulses intact and equal Skin: warm/dry no rash  2-D echocardiogram 01/06/2012: Study Conclusions  -  Left ventricle: The cavity size was normal. Wall thickness was increased in a pattern of mild LVH. Basal to mid posterior akinesis, anterolateral hypokinesis. Systolic function was mildly reduced. The estimated ejection fraction was in the range of 45% to 50%. Features are consistent with a pseudonormal left ventricular filling pattern, with concomitant abnormal relaxation and increased filling pressure (grade 2 diastolic dysfunction). - Aortic valve: There was no stenosis. - Mitral valve: Trivial regurgitation. - Left atrium: The atrium was mildly to moderately  dilated. - Right ventricle: The cavity size was normal. Systolic function was normal. - Right atrium: The atrium was mildly dilated. - Pulmonary arteries: No complete TR doppler jet so unable to estimate PA systolic pressure. Impressions:  - Normal LV size with mild LV hypertrophy. EF 45-50% with basal to mid posterior akinesis and anterolateral hypokinesis. Normal RV size and systolic function. No significant valvular abnormalities.  ASSESSMENT AND PLAN: 1. Coronary artery disease, native vessel. The patient appears stable without any change in the pattern of his mild angina. He will continue on his current medical program.  2. Essential hypertension with suboptimal control. Recommend add hydralazine 25 mg twice a day. He's had bradycardia limiting an increase in his metoprolol. He is already on a maximum dose of a calcium channel blocker and ACE inhibitor. I'm reluctant to use hydrochlorothiazide because of gout.  For followup I will see him back in 6 months.  Tonny BollmanMichael Torie Priebe 02/16/2013 6:08 PM

## 2013-02-16 NOTE — Patient Instructions (Signed)
Your physician has recommended you make the following change in your medication:  Add Hydralazine 25 mg twice daily  Your physician wants you to follow-up in: 6 months with Dr. Excell Seltzerooper.  You will receive a reminder letter in the mail two months in advance. If you don't receive a letter, please call our office to schedule the follow-up appointment.

## 2013-03-31 ENCOUNTER — Other Ambulatory Visit: Payer: Self-pay | Admitting: Cardiovascular Disease

## 2013-04-13 ENCOUNTER — Other Ambulatory Visit: Payer: Self-pay | Admitting: *Deleted

## 2013-04-13 MED ORDER — LISINOPRIL 40 MG PO TABS: 40.0000 mg | ORAL_TABLET | Freq: Every day | ORAL | Status: DC

## 2013-04-13 MED ORDER — AMLODIPINE BESYLATE 10 MG PO TABS: 10.0000 mg | ORAL_TABLET | Freq: Every day | ORAL | Status: DC

## 2013-06-27 ENCOUNTER — Encounter: Payer: Managed Care, Other (non HMO) | Admitting: Medical

## 2013-06-30 ENCOUNTER — Encounter: Payer: Managed Care, Other (non HMO) | Admitting: Medical

## 2013-07-06 ENCOUNTER — Encounter: Payer: Managed Care, Other (non HMO) | Admitting: Medical

## 2013-08-19 ENCOUNTER — Ambulatory Visit (INDEPENDENT_AMBULATORY_CARE_PROVIDER_SITE_OTHER): Payer: BC Managed Care – PPO | Admitting: Cardiovascular Disease

## 2013-08-19 DIAGNOSIS — I209 Angina pectoris, unspecified: Secondary | ICD-10-CM

## 2013-08-19 DIAGNOSIS — I2510 Atherosclerotic heart disease of native coronary artery without angina pectoris: Secondary | ICD-10-CM

## 2013-08-24 NOTE — Progress Notes (Signed)
   Patient ID: Timothy Olsen, male    DOB: 1956-02-09, 58 y.o.   MRN: [id number]  HPI    Review of Systems    Physical Exam     ERRONEOUS ENCOUNTER

## 2013-08-25 ENCOUNTER — Ambulatory Visit (INDEPENDENT_AMBULATORY_CARE_PROVIDER_SITE_OTHER): Payer: BC Managed Care – PPO | Admitting: Cardiovascular Disease

## 2013-08-25 ENCOUNTER — Telehealth: Payer: Self-pay

## 2013-08-25 ENCOUNTER — Encounter: Payer: Self-pay | Admitting: Cardiovascular Disease

## 2013-08-25 VITALS — BP 148/99 | HR 62 | Ht 76.0 in | Wt 223.1 lb

## 2013-08-25 DIAGNOSIS — I10 Essential (primary) hypertension: Secondary | ICD-10-CM

## 2013-08-25 DIAGNOSIS — I2510 Atherosclerotic heart disease of native coronary artery without angina pectoris: Secondary | ICD-10-CM

## 2013-08-25 MED ORDER — HYDRALAZINE HCL 25 MG PO TABS
25.0000 mg | ORAL_TABLET | Freq: Three times a day (TID) | ORAL | Status: DC
Start: 2013-08-25 — End: 2014-08-23

## 2013-08-25 NOTE — Patient Instructions (Addendum)
Your physician has recommended you make the following change in your medication: INCREASE Hydralazine to $RemoveBefore [id number]$25mgan wants you to follow-up in: 1 YEAR with Dr Excell Seltzerooper.  You will receive a reminder letter in the mail two months in advance. If you don't receive a letter, please call our office to schedule the follow-up appointment.

## 2013-08-25 NOTE — Telephone Encounter (Signed)
The pt was in the office today seeing Dr Excell Seltzerooper and he said he restarted a gout medicine but did not remember the name.  He called back in to let us know it was indomethacin. [facility] said that they have not given the pt this prescription since 2013. I have placed the medicine in his list.

## 2013-08-25 NOTE — Progress Notes (Signed)
HPI:  58 year old gentleman presenting for followup evaluation. He is followed for cardiomyopathy, hypertension, and diffuse small vessel coronary artery disease. He is limited by peripheral neuropathy related to long-standing diabetes. He had atrial fibrillation back in 2013 but was unable to tolerate any anticoagulant medications. He was tried on Coumadin and Xarelto. The patient was last seen in January.  He reports no new cardiac symptoms. The patient denies dyspnea, chest pain, edema, or heart palpitations. He does have some dizziness after taking hydralazine. He notes blood pressures have been elevated in the afternoon. No other complaints today.  Outpatient Encounter Prescriptions as of 08/25/2013  Medication Sig  . amLODipine (NORVASC) 10 MG tablet Take 1 tablet (10 mg total) by mouth daily.  Marland Kitchen aspirin EC 81 MG tablet Take 1 tablet (81 mg total) by mouth daily.  . colchicine 0.6 MG tablet Take 0.6 mg by mouth daily as needed. For gout symptoms  . glipiZIDE (GLUCOTROL) 5 MG tablet Take 5 mg by mouth 2 (two) times daily before a meal.    . hydrALAZINE (APRESOLINE) 25 MG tablet Take 1 tablet (25 mg total) by mouth 2 (two) times daily.  Marland Kitchen HYDROcodone-acetaminophen (NORCO/VICODIN) 5-325 MG per tablet Take 2 tablets by mouth every 6 (six) hours as needed.  Marland Kitchen lisinopril (PRINIVIL,ZESTRIL) 40 MG tablet Take 1 tablet (40 mg total) by mouth daily.  . metFORMIN (GLUCOPHAGE) 500 MG tablet Take 500 mg by mouth 2 (two) times daily with a meal. May take 250mg  twice daily if blood sugar is already low  . metoprolol (LOPRESSOR) 50 MG tablet TAKE ONE-HALF TABLET BY MOUTH TWICE DAILY  . metoprolol (LOPRESSOR) 50 MG tablet TAKE ONE-HALF TABLET BY MOUTH TWICE DAILY  . nitroGLYCERIN (NITROSTAT) 0.4 MG SL tablet Place 1 tablet (0.4 mg total) under the tongue every 5 (five) minutes as needed. For chest pain  . pravastatin (PRAVACHOL) 20 MG tablet Take 20 mg by mouth daily.     No Known Allergies  Past  Medical History  Diagnosis Date  . Hypertension   . Diabetes mellitus   . CAD (coronary artery disease)     LHC 7/13: mOM1 50-75%, ostial subbranch 80-90% (very small), PL branch 80-90% (very small), mRCA 40-50%, EF 30% => med Rx for small vessel disease  . Atrial fibrillation     CHADS2=3; Xarelto started 7/13; s/p TEE-DCCV => NSR;  patient back in AF with RVR at f/u 8/13 => amiodarone started => back in NSR 9/13 f/u OV  . Ischemic cardiomyopathy     echo 7/13: mild LVH, EF 25-30%, global HK with severe Hk to AK of basal to mid inf and post walls, severe diast dysfxn, mild MR, mod LAE, mod reduced RV fxn, mod RAE, PASP 50 (mild to mod pulmo HTN);  TEE 7/13 with EF 30-35%  . Epistaxis 08/2011    On heparin and Coumadin s/p silver nitrate cauterization L nare  . Hyperlipidemia   . Chronic combined systolic and diastolic CHF (congestive heart failure)   . Gout   . Tobacco abuse   . Alcohol abuse   . MI (myocardial infarction)    ROS: Negative except as per HPI  BP 148/99  Pulse 62  Ht 6\' 4"  (1.93 m)  Wt 223 lb 1.9 oz (101.207 kg)  BMI 27.17 kg/m2  PHYSICAL EXAM: Pt is alert and oriented, NAD HEENT: Poor dentition, otherwise normal Neck: JVP - normal, carotids 2+= without bruits Lungs: CTA bilaterally CV: RRR without murmur or gallop Abd: soft,  NT, Positive BS, no hepatomegaly Ext: no C/C/E, distal pulses intact and equal Skin: warm/dry no rash  EKG:  Normal sinus rhythm with occasional PVC, age indeterminate inferior MI.  ASSESSMENT AND PLAN: 1. Coronary artery disease, native vessel. The patient has small vessel CAD based on cardiac cath results from 2013. He is having no anginal symptoms. He will continue with medical therapy.  2. Cardiomyopathy. The patient initially had severe LV dysfunction, but LVEF has improved to the range of 45-50%. This probably has been multifactorial with history of rapid atrial fib, malignant hypertension, and coronary artery disease. He's  tolerating metoprolol and lisinopril without evidence of volume overload on exam.  3. Paroxysmal atrial fibrillation. Unable to take anticoagulant drugs. He does tolerate low-dose aspirin. No episodes of atrial fibrillation in the last 12 months.  4. Hypertension with suboptimal control. Recommended increase hydralazine to 25 mg 3 times daily. Tonny BollmanMichael Ashtian Villacis 08/25/2013 11:24 AM

## 2013-08-27 ENCOUNTER — Encounter: Payer: Self-pay | Admitting: Cardiovascular Disease

## 2013-09-21 ENCOUNTER — Ambulatory Visit (INDEPENDENT_AMBULATORY_CARE_PROVIDER_SITE_OTHER): Payer: BC Managed Care – PPO

## 2013-09-21 ENCOUNTER — Encounter: Payer: Self-pay | Admitting: Family Medicine

## 2013-09-21 ENCOUNTER — Ambulatory Visit (INDEPENDENT_AMBULATORY_CARE_PROVIDER_SITE_OTHER): Payer: BC Managed Care – PPO | Admitting: Family Medicine

## 2013-09-21 VITALS — BP 128/70 | HR 67 | Temp 97.8°F | Resp 16 | Ht 73.5 in | Wt 218.0 lb

## 2013-09-21 DIAGNOSIS — I5022 Chronic systolic (congestive) heart failure: Secondary | ICD-10-CM

## 2013-09-21 DIAGNOSIS — M7989 Other specified soft tissue disorders: Secondary | ICD-10-CM

## 2013-09-21 DIAGNOSIS — E1140 Type 2 diabetes mellitus with diabetic neuropathy, unspecified: Secondary | ICD-10-CM

## 2013-09-21 DIAGNOSIS — M12841 Other specific arthropathies, not elsewhere classified, right hand: Secondary | ICD-10-CM

## 2013-09-21 DIAGNOSIS — M79644 Pain in right finger(s): Secondary | ICD-10-CM

## 2013-09-21 DIAGNOSIS — M12849 Other specific arthropathies, not elsewhere classified, unspecified hand: Secondary | ICD-10-CM

## 2013-09-21 DIAGNOSIS — E1149 Type 2 diabetes mellitus with other diabetic neurological complication: Secondary | ICD-10-CM

## 2013-09-21 DIAGNOSIS — M79609 Pain in unspecified limb: Secondary | ICD-10-CM

## 2013-09-21 DIAGNOSIS — E1142 Type 2 diabetes mellitus with diabetic polyneuropathy: Secondary | ICD-10-CM

## 2013-09-21 LAB — CBC WITH DIFFERENTIAL/PLATELET
BASOS ABS: 0 10*3/uL (ref 0.0–0.1)
Basophils Relative: 0 % (ref 0–1)
Eosinophils Absolute: 0.1 10*3/uL (ref 0.0–0.7)
Eosinophils Relative: 2 % (ref 0–5)
HEMATOCRIT: 39.9 % (ref 39.0–52.0)
Hemoglobin: 13.9 g/dL (ref 13.0–17.0)
Lymphocytes Relative: 36 % (ref 12–46)
Lymphs Abs: 2.2 10*3/uL (ref 0.7–4.0)
MCH: 29.3 pg (ref 26.0–34.0)
MCHC: 34.8 g/dL (ref 30.0–36.0)
MCV: 84.2 fL (ref 78.0–100.0)
MONO ABS: 0.4 10*3/uL (ref 0.1–1.0)
Monocytes Relative: 7 % (ref 3–12)
NEUTROS ABS: 3.4 10*3/uL (ref 1.7–7.7)
NEUTROS PCT: 55 % (ref 43–77)
Platelets: 233 10*3/uL (ref 150–400)
RBC: 4.74 MIL/uL (ref 4.22–5.81)
RDW: 14.3 % (ref 11.5–15.5)
WBC: 6.2 10*3/uL (ref 4.0–10.5)

## 2013-09-21 LAB — URIC ACID: URIC ACID, SERUM: 9.6 mg/dL — AB (ref 4.0–7.8)

## 2013-09-21 LAB — GLUCOSE, POCT (MANUAL RESULT ENTRY): POC GLUCOSE: 154 mg/dL — AB (ref 70–99)

## 2013-09-21 MED ORDER — PREDNISONE 20 MG PO TABS: ORAL_TABLET | ORAL | Status: DC

## 2013-09-21 MED ORDER — HYDROCODONE-ACETAMINOPHEN 5-325 MG PO TABS: 1.0000 | ORAL_TABLET | ORAL | Status: DC | PRN

## 2013-09-21 NOTE — Progress Notes (Signed)
Subjective:    Patient ID: Timothy Olsen, male    DOB: September 03, 1955, 58 y.o.   MRN: [id number]  09/21/2013  Hand Pain   Hand Pain    This 58 y.o. male presents for evaluation of R hand (fourth and fifth finger) swelling. Evaluated by [name], MD who is PCP; prescribed Medrol Dose Pack two weeks ago; took dose pack for five days with mild improvement in swelling but significant improvement in redness.  Called Dr. [name] office yesterday; nurse said that Medrol dose pack is not always effective.  [name], M.D. is out of town for two weeks; recommended evaluation at another facility.  Skin was really red along pinky of R hand; sore in 5th digit and 4th digit. Thinks may have gotten bitten by something.  Dr. [name] thought that patient had an allergic reaction to an insect bite.  Swelling has not improved at all.  Redness is much better.  Takes medication for gout; takes Colchicine and Indomethacin PRN; not sure if he has every had a gouty attack before.   No history gouty attacks.  Has rheumatoid arthritis dx from the past but not maintained on rheumatologic medications ?   Previous MD could not say it was gout but uric acid level was elevated.  No trauma to hand R.  +Disability for CHF, peripheral neuropathy and "multiple other health problems".  Not currently working.  Suffering with significant pain in fifth digit.  No fever/chills/sweats.  Sugars run good.   Review of Systems  Constitutional: Negative for fever, chills, diaphoresis and fatigue.  Musculoskeletal: Positive for arthralgias, joint swelling and myalgias.  Skin: Positive for color change. Negative for pallor, rash and wound.    Past Medical History  Diagnosis Date  . Hypertension   . Diabetes mellitus   . CAD (coronary artery disease)     LHC 7/13: mOM1 50-75%, ostial subbranch 80-90% (very small), PL branch 80-90% (very small), mRCA 40-50%, EF 30% => med Rx for small vessel disease  . Atrial fibrillation    CHADS2=3; Xarelto started 7/13; s/p TEE-DCCV => NSR;  patient back in AF with RVR at f/u 8/13 => amiodarone started => back in NSR 9/13 f/u OV  . Ischemic cardiomyopathy     echo 7/13: mild LVH, EF 25-30%, global HK with severe Hk to AK of basal to mid inf and post walls, severe diast dysfxn, mild MR, mod LAE, mod reduced RV fxn, mod RAE, PASP 50 (mild to mod pulmo HTN);  TEE 7/13 with EF 30-35%  . Epistaxis 08/2011    On heparin and Coumadin s/p silver nitrate cauterization L nare  . Hyperlipidemia   . Chronic combined systolic and diastolic CHF (congestive heart failure)   . Gout   . Tobacco abuse   . Alcohol abuse   . MI (myocardial infarction)    Past Surgical History  Procedure Laterality Date  . Tonsillectomy    . Left thumb surgery    . Adenoidectomy    . Tee without cardioversion  09/15/2011    Procedure: TRANSESOPHAGEAL ECHOCARDIOGRAM (TEE);  Surgeon: Lelon Perla, MD;  Location: [facility] ENDOSCOPY;  Service: Cardiovascular;  Laterality: N/A;  . Cardioversion  09/15/2011    Procedure: CARDIOVERSION;  Surgeon: Lelon Perla, MD;  Location: [facility] ENDOSCOPY;  Service: Cardiovascular;  Laterality: N/A;  . Cardiac catheterization  09/09/2011    a) widely patent left main with minor luminal irregularities, patent LAD, mild prox LAD calcification and luminal irregularities, patent D1, diffuse prox  and LCX- mid irregularity, 50-75% mid OM1 stenosis, 80-90% ostial stenosis in small subbranch, small PLB 80-90% stenosis, RCA- mild proximal luminal irregularity, mid 40-50% stenosis, distal irregularity, PDA and PLB patent; LVEF 30% and global L   No Known Allergies Current Outpatient Prescriptions  Medication Sig Dispense Refill  . amLODipine (NORVASC) 10 MG tablet Take 1 tablet (10 mg total) by mouth daily.  30 tablet  3  . aspirin EC 81 MG tablet Take 1 tablet (81 mg total) by mouth daily.  1 tablet  0  . colchicine 0.6 MG tablet Take 0.6 mg by mouth daily as needed. For gout symptoms      .  glipiZIDE (GLUCOTROL) 5 MG tablet Take 5 mg by mouth 2 (two) times daily before a meal.        . hydrALAZINE (APRESOLINE) 25 MG tablet Take 1 tablet (25 mg total) by mouth 3 (three) times daily.  90 tablet  11  . indomethacin (INDOCIN) 25 MG capsule Take 25 mg by mouth 3 (three) times daily with meals.      Marland Kitchen lisinopril (PRINIVIL,ZESTRIL) 40 MG tablet Take 1 tablet (40 mg total) by mouth daily.  30 tablet  3  . metFORMIN (GLUCOPHAGE) 500 MG tablet Take 500 mg by mouth 2 (two) times daily with a meal. May take 252m twice daily if blood sugar is already low      . metoprolol (LOPRESSOR) 50 MG tablet TAKE ONE-HALF TABLET BY MOUTH TWICE DAILY  30 tablet  0  . nitroGLYCERIN (NITROSTAT) 0.4 MG SL tablet Place 1 tablet (0.4 mg total) under the tongue every 5 (five) minutes as needed. For chest pain  25 tablet  2  . pravastatin (PRAVACHOL) 20 MG tablet Take 20 mg by mouth daily.       .Marland KitchenHYDROcodone-acetaminophen (NORCO/VICODIN) 5-325 MG per tablet Take 1 tablet by mouth every 4 (four) hours as needed.  30 tablet  0  . predniSONE (DELTASONE) 20 MG tablet Three tablets daily x 1 day then two tablets daily x 5 days then one tablet daily x 5 days  18 tablet  0   No current facility-administered medications for this visit.       Objective:    BP 128/70  Pulse 67  Temp(Src) 97.8 F (36.6 C) (Oral)  Resp 16  Ht 6' 1.5" (1.867 m)  Wt 218 lb (98.884 kg)  BMI 28.37 kg/m2  SpO2 96% Physical Exam  Constitutional: He is oriented to person, place, and time. He appears well-developed and well-nourished. No distress.  HENT:  Head: Normocephalic and atraumatic.  Neck: Normal range of motion. Neck supple.  Cardiovascular:  Pulses:      Radial pulses are 2+ on the right side, and 2+ on the left side.  Capillary refill < 3 seconds R hand digits  Musculoskeletal:       Right hand: He exhibits decreased range of motion, tenderness, bony tenderness and swelling. He exhibits normal two-point discrimination,  normal capillary refill, no deformity and no laceration. Normal sensation noted. Normal strength noted.  R HAND:  +TTP AT FIFTH MCP JOINT AND PIP AND DIP JOINTS WITH DIFFUSE SWELLING; MINIMAL TTP DISTAL FINGER; DECREASED FLEXION OF FIFTH DIGIT.  FOURTH DIGIT WITHOUT TENDERNESS OR SWELLING.   Neurological: He is alert and oriented to person, place, and time.  Skin: He is not diaphoretic.  No erythema of skin of R hand; healing puncture wound at base of fifth digit palmar surface.  No streaking; no redness; no  induration.  Psychiatric: He has a normal mood and affect. His behavior is normal.   Results for orders placed in visit on 09/21/13  GLUCOSE, POCT (MANUAL RESULT ENTRY)      Result Value Ref Range   POC Glucose 154 (*) 70 - 99 mg/dl   [facility] reading (PRIMARY) by  Dr. Tamala Julian.  R HAND:  DEGENERATIVE CHANGES; NO ACUTE.      Assessment & Plan:   1. Swelling of right hand   2. Pain in finger of right hand   3. Type 2 diabetes mellitus with diabetic neuropathy   4. Arthritis, episodic, of hand, right   5. Chronic systolic heart failure    1. Swelling R hand fifth digit:  New.  No evidence of cellulitis.  ddx includes acute arthritic exacerbation (gouty versus rheumatoid versus OA), insect bite with local allergic reaction.  Treat with Prednisone.  Obtain CBC, uric acid level, ESR.  RTC immediately for increasing swelling, redness, pain. 2.  Pain in fifth finger R hand:  New.  Rx for hydrocodone provided for PRN use. 3.  R hand fifth finger arthritis:  New.  Ddx acute gouty attack, acute rheumatoid arthritis exacerbation, acute OA exacerbation; rx for Prednisone provided.   4.  DMII: stable; monitor sugars closely while taking Prednisone taper. 5.  CHF systolic and diastolic: stable; euvolemic in office.  Avoid NSAID use.  Meds ordered this encounter  Medications  . predniSONE (DELTASONE) 20 MG tablet    Sig: Three tablets daily x 1 day then two tablets daily x 5 days then one tablet daily  x 5 days    Dispense:  18 tablet    Refill:  0  . HYDROcodone-acetaminophen (NORCO/VICODIN) 5-325 MG per tablet    Sig: Take 1 tablet by mouth every 4 (four) hours as needed.    Dispense:  30 tablet    Refill:  0    No Follow-up on file.   Reginia Forts, M.D.  [facility] [address] [place], [place]  [address] [phone] phone [phone] fax

## 2013-09-21 NOTE — Patient Instructions (Signed)
1.  Elevate finger as much as possible.   2.  Return for increasing pain, increasing swelling, or increasing redness.

## 2013-09-23 ENCOUNTER — Telehealth: Payer: Self-pay | Admitting: *Deleted

## 2013-09-23 NOTE — Telephone Encounter (Signed)
Call --- would recommend him discussing starting Allopurinol with Dr. Loni DollyBlount/PCP. I have only seen him one time for an acute visit; thus, it is best if he discuss starting a new daily medication with his PCP.

## 2013-09-23 NOTE — Telephone Encounter (Signed)
Pt called concerning wanting to try allopurinol. Reports that you mentioned it to him at his last visit; wanted to see if you would be willing to send in a prescription for him?

## 2013-09-24 NOTE — Telephone Encounter (Signed)
Pt.notified

## 2013-09-24 NOTE — Telephone Encounter (Signed)
Left message on machine to call back  

## 2013-09-28 ENCOUNTER — Other Ambulatory Visit: Payer: Self-pay | Admitting: *Deleted

## 2013-09-28 MED ORDER — LISINOPRIL 40 MG PO TABS: 40.0000 mg | ORAL_TABLET | Freq: Every day | ORAL | Status: DC

## 2013-09-29 ENCOUNTER — Other Ambulatory Visit: Payer: Self-pay

## 2013-09-29 DIAGNOSIS — I2510 Atherosclerotic heart disease of native coronary artery without angina pectoris: Secondary | ICD-10-CM

## 2013-09-29 DIAGNOSIS — I10 Essential (primary) hypertension: Secondary | ICD-10-CM

## 2013-09-29 MED ORDER — AMLODIPINE BESYLATE 10 MG PO TABS: 10.0000 mg | ORAL_TABLET | Freq: Every day | ORAL | Status: DC

## 2013-09-29 MED ORDER — PRAVASTATIN SODIUM 20 MG PO TABS: 20.0000 mg | ORAL_TABLET | Freq: Every day | ORAL | Status: DC

## 2013-09-29 MED ORDER — LISINOPRIL 40 MG PO TABS
40.0000 mg | ORAL_TABLET | Freq: Every day | ORAL | Status: DC
Start: 2013-09-29 — End: 2014-08-30

## 2013-09-29 MED ORDER — METOPROLOL TARTRATE 50 MG PO TABS: ORAL_TABLET | ORAL | Status: DC

## 2013-10-10 ENCOUNTER — Other Ambulatory Visit: Payer: Self-pay

## 2013-10-10 MED ORDER — INDOMETHACIN 25 MG PO CAPS: 25.0000 mg | ORAL_CAPSULE | Freq: Three times a day (TID) | ORAL | Status: DC

## 2013-12-15 ENCOUNTER — Ambulatory Visit (INDEPENDENT_AMBULATORY_CARE_PROVIDER_SITE_OTHER): Payer: BC Managed Care – PPO | Admitting: Family Medicine

## 2013-12-15 VITALS — BP 136/74 | HR 66 | Temp 98.5°F | Resp 16 | Ht 75.5 in | Wt 217.0 lb

## 2013-12-15 DIAGNOSIS — N410 Acute prostatitis: Secondary | ICD-10-CM

## 2013-12-15 DIAGNOSIS — R631 Polydipsia: Secondary | ICD-10-CM

## 2013-12-15 DIAGNOSIS — R109 Unspecified abdominal pain: Secondary | ICD-10-CM

## 2013-12-15 LAB — POCT URINALYSIS DIPSTICK
GLUCOSE UA: NEGATIVE
Ketones, UA: 15
LEUKOCYTES UA: NEGATIVE
Nitrite, UA: POSITIVE
Protein, UA: 30
RBC UA: NEGATIVE
Spec Grav, UA: <=1.005
UROBILINOGEN UA: 0.2
pH, UA: 5

## 2013-12-15 LAB — POCT CBC
Granulocyte percent: 63.5 %G (ref 37–80)
HCT, POC: 40.9 % — AB (ref 43.5–53.7)
Hemoglobin: 13.2 g/dL — AB (ref 14.1–18.1)
Lymph, poc: 2.3 (ref 0.6–3.4)
MCH: 28.1 pg (ref 27–31.2)
MCHC: 32.3 g/dL (ref 31.8–35.4)
MCV: 87 fL (ref 80–97)
MID (cbc): 0.5 (ref 0–0.9)
MPV: 6.5 fL (ref 0–99.8)
POC Granulocyte: 4.9 (ref 2–6.9)
POC LYMPH %: 30.2 % (ref 10–50)
POC MID %: 6.3 %M (ref 0–12)
Platelet Count, POC: 279 10*3/uL (ref 142–424)
RBC: 4.7 M/uL (ref 4.69–6.13)
RDW, POC: 16.2 %
WBC: 7.7 10*3/uL (ref 4.6–10.2)

## 2013-12-15 LAB — POCT UA - MICROSCOPIC ONLY
Bacteria, U Microscopic: NEGATIVE
CASTS, UR, LPF, POC: NEGATIVE
Crystals, Ur, HPF, POC: NEGATIVE
MUCUS UA: NEGATIVE
RBC, urine, microscopic: NEGATIVE
YEAST UA: NEGATIVE

## 2013-12-15 LAB — GLUCOSE, POCT (MANUAL RESULT ENTRY): POC Glucose: 93 mg/dl (ref 70–99)

## 2013-12-15 MED ORDER — DOXYCYCLINE HYCLATE 100 MG PO CAPS: 100.0000 mg | ORAL_CAPSULE | Freq: Two times a day (BID) | ORAL | Status: DC

## 2013-12-15 MED ORDER — CEFIXIME 400 MG PO TABS: 400.0000 mg | ORAL_TABLET | Freq: Every day | ORAL | Status: DC

## 2013-12-15 MED ORDER — HYDROCODONE-ACETAMINOPHEN 5-325 MG PO TABS: 1.0000 | ORAL_TABLET | ORAL | Status: DC | PRN

## 2013-12-15 NOTE — Patient Instructions (Signed)

## 2013-12-15 NOTE — Progress Notes (Signed)
Subjective:    Patient ID: Timothy Olsen, male    DOB: 10-17-1955, 58 y.o.   MRN: [id number] This chart was scribed for Norberto Sorenson, MD by Jolene Provost, Medical Scribe. This patient was seen in Room 14 and the patient's care was started a 11:46 AM.  Chief Complaint  Patient presents with  . Back Pain    left side. x3-4 days. Pain went away and then came back.     HPI Past Medical History  Diagnosis Date  . Hypertension   . Diabetes mellitus   . CAD (coronary artery disease)     LHC 7/13: mOM1 50-75%, ostial subbranch 80-90% (very small), PL branch 80-90% (very small), mRCA 40-50%, EF 30% => med Rx for small vessel disease  . Atrial fibrillation     CHADS2=3; Xarelto started 7/13; s/p TEE-DCCV => NSR;  patient back in AF with RVR at f/u 8/13 => amiodarone started => back in NSR 9/13 f/u OV  . Ischemic cardiomyopathy     echo 7/13: mild LVH, EF 25-30%, global HK with severe Hk to AK of basal to mid inf and post walls, severe diast dysfxn, mild MR, mod LAE, mod reduced RV fxn, mod RAE, PASP 50 (mild to mod pulmo HTN);  TEE 7/13 with EF 30-35%  . Epistaxis 08/2011    On heparin and Coumadin s/p silver nitrate cauterization L nare  . Hyperlipidemia   . Chronic combined systolic and diastolic CHF (congestive heart failure)   . Gout   . Tobacco abuse   . Alcohol abuse   . MI (myocardial infarction)    No Known Allergies Current Outpatient Prescriptions on File Prior to Visit  Medication Sig Dispense Refill  . amLODipine (NORVASC) 10 MG tablet Take 1 tablet (10 mg total) by mouth daily. 30 tablet 11  . aspirin EC 81 MG tablet Take 1 tablet (81 mg total) by mouth daily. 1 tablet 0  . colchicine 0.6 MG tablet Take 0.6 mg by mouth daily as needed. For gout symptoms    . glipiZIDE (GLUCOTROL) 5 MG tablet Take 5 mg by mouth 2 (two) times daily before a meal.      . hydrALAZINE (APRESOLINE) 25 MG tablet Take 1 tablet (25 mg total) by mouth 3 (three) times daily. 90 tablet 11  .  HYDROcodone-acetaminophen (NORCO/VICODIN) 5-325 MG per tablet Take 1 tablet by mouth every 4 (four) hours as needed. 30 tablet 0  . indomethacin (INDOCIN) 25 MG capsule Take 1 capsule (25 mg total) by mouth 3 (three) times daily with meals. 90 capsule 6  . lisinopril (PRINIVIL,ZESTRIL) 40 MG tablet Take 1 tablet (40 mg total) by mouth daily. 30 tablet 11  . metFORMIN (GLUCOPHAGE) 500 MG tablet Take 500 mg by mouth 2 (two) times daily with a meal. May take 250mg  twice daily if blood sugar is already low    . metoprolol (LOPRESSOR) 50 MG tablet TAKE ONE-HALF TABLET BY MOUTH TWICE DAILY 30 tablet 11  . nitroGLYCERIN (NITROSTAT) 0.4 MG SL tablet Place 1 tablet (0.4 mg total) under the tongue every 5 (five) minutes as needed. For chest pain 25 tablet 2  . pravastatin (PRAVACHOL) 20 MG tablet Take 1 tablet (20 mg total) by mouth daily. 30 tablet 11  . predniSONE (DELTASONE) 20 MG tablet Three tablets daily x 1 day then two tablets daily x 5 days then one tablet daily x 5 days 18 tablet 0   No current facility-administered medications on file prior to visit.  HPI Comments: Timothy Olsen is a 58 y.o. male with a past hx of UTIs who [facility] complaining of left-sided back pain that radiates to left lower abdomen, and right flank that started three days ago. Pt has taken tylenol, advil, and AZO OTC without relief of symptoms. Pt states he has been more thirsty than normal. Pt endorses appetite change. Pt denies nausea, dysuria, vomiting, fever or chills. Pt states he had a bad cold last week, and reported to his PCP. Pt states he drinks occasionally. Pt has NKDA.    Review of Systems  Constitutional: Negative for fever and chills.  HENT: Negative for rhinorrhea.   Gastrointestinal: Positive for abdominal pain. Negative for nausea.  Genitourinary: Positive for frequency and flank pain.       Objective:  BP 136/74 mmHg  Pulse 66  Temp(Src) 98.5 F (36.9 C) (Oral)  Resp 16  Ht 6' 3.5"  (1.918 m)  Wt 217 lb (98.431 kg)  BMI 26.76 kg/m2  SpO2 99%  Physical Exam  Constitutional: He is oriented to person, place, and time. He appears well-developed and well-nourished.  HENT:  Head: Normocephalic and atraumatic.  Eyes: Pupils are equal, round, and reactive to light.  Neck: Neck supple. No JVD present. No thyromegaly present.  Cardiovascular: Normal rate, regular rhythm, S1 normal, S2 normal and normal heart sounds.   No murmur heard. Pulmonary/Chest: Effort normal and breath sounds normal. No respiratory distress. He has no wheezes.  Genitourinary: Rectal exam shows tenderness. Rectal exam shows no external hemorrhoid, no internal hemorrhoid, no mass and anal tone normal. Prostate is tender. Prostate is not enlarged.  Neurological: He is alert and oriented to person, place, and time.  Skin: Skin is warm and dry.  Psychiatric: He has a normal mood and affect. His behavior is normal.  Nursing note and vitals reviewed.      Assessment & Plan:   Left flank pain - Plan: POCT UA - Microscopic Only, POCT urinalysis dipstick, POCT CBC, Comprehensive metabolic panel, Urine culture, GC/Chlamydia Probe Amp  Polydipsia - Plan: POCT glucose (manual entry), Comprehensive metabolic panel  Prostatitis, acute - Plan: Urine culture  Meds ordered this encounter  Medications  . doxycycline (VIBRAMYCIN) 100 MG capsule    Sig: Take 1 capsule (100 mg total) by mouth 2 (two) times daily.    Dispense:  20 capsule    Refill:  0  . cefixime (SUPRAX) 400 MG tablet    Sig: Take 1 tablet (400 mg total) by mouth daily.    Dispense:  1 tablet    Refill:  0  . HYDROcodone-acetaminophen (NORCO/VICODIN) 5-325 MG per tablet    Sig: Take 1 tablet by mouth every 4 (four) hours as needed for severe pain.    Dispense:  15 tablet    Refill:  0    I personally performed the services described in this documentation, which was scribed in my presence. The recorded information has been reviewed and  considered, and addended by me as needed.  Norberto SorensonEva Jaylon Grode, MD MPH  Results for orders placed or performed in visit on 12/15/13  Urine culture  Result Value Ref Range   Colony Count NO GROWTH    Organism ID, Bacteria NO GROWTH   GC/Chlamydia Probe Amp  Result Value Ref Range   CT Probe RNA NEGATIVE    GC Probe RNA NEGATIVE   Comprehensive metabolic panel  Result Value Ref Range   Sodium 134 (L) 135 - 145 mEq/L   Potassium 4.3  3.5 - 5.3 mEq/L   Chloride 103 96 - 112 mEq/L   CO2 25 19 - 32 mEq/L   Glucose, Bld 88 70 - 99 mg/dL   BUN 11 6 - 23 mg/dL   Creat 1.610.72 0.960.50 - 0.451.35 mg/dL   Total Bilirubin 0.6 0.2 - 1.2 mg/dL   Alkaline Phosphatase 64 39 - 117 U/L   AST 14 0 - 37 U/L   ALT <8 0 - 53 U/L   Total Protein 6.7 6.0 - 8.3 g/dL   Albumin 3.9 3.5 - 5.2 g/dL   Calcium 9.7 8.4 - 40.910.5 mg/dL  POCT UA - Microscopic Only  Result Value Ref Range   WBC, Ur, HPF, POC 0-1    RBC, urine, microscopic neg    Bacteria, U Microscopic neg    Mucus, UA neg    Epithelial cells, urine per micros 1-2    Crystals, Ur, HPF, POC neg    Casts, Ur, LPF, POC neg    Yeast, UA neg   POCT urinalysis dipstick  Result Value Ref Range   Color, UA dark yellow    Clarity, UA clear    Glucose, UA neg    Bilirubin, UA moderate    Ketones, UA 15    Spec Grav, UA <=1.005    Blood, UA neg    pH, UA 5.0    Protein, UA 30    Urobilinogen, UA 0.2    Nitrite, UA pos    Leukocytes, UA Negative   POCT CBC  Result Value Ref Range   WBC 7.7 4.6 - 10.2 K/uL   Lymph, poc 2.3 0.6 - 3.4   POC LYMPH PERCENT 30.2 10 - 50 %L   MID (cbc) 0.5 0 - 0.9   POC MID % 6.3 0 - 12 %M   POC Granulocyte 4.9 2 - 6.9   Granulocyte percent 63.5 37 - 80 %G   RBC 4.70 4.69 - 6.13 M/uL   Hemoglobin 13.2 (A) 14.1 - 18.1 g/dL   HCT, POC 81.140.9 (A) 91.443.5 - 53.7 %   MCV 87.0 80 - 97 fL   MCH, POC 28.1 27 - 31.2 pg   MCHC 32.3 31.8 - 35.4 g/dL   RDW, POC 78.216.2 %   Platelet Count, POC 279 142 - 424 K/uL   MPV 6.5 0 - 99.8 fL  POCT  glucose (manual entry)  Result Value Ref Range   POC Glucose 93 70 - 99 mg/dl

## 2013-12-16 LAB — COMPREHENSIVE METABOLIC PANEL
ALT: <8 U/L (ref 0–53)
AST: 14 U/L (ref 0–37)
Albumin: 3.9 g/dL (ref 3.5–5.2)
Alkaline Phosphatase: 64 U/L (ref 39–117)
BILIRUBIN TOTAL: 0.6 mg/dL (ref 0.2–1.2)
BUN: 11 mg/dL (ref 6–23)
CO2: 25 meq/L (ref 19–32)
CREATININE: 0.72 mg/dL (ref 0.50–1.35)
Calcium: 9.7 mg/dL (ref 8.4–10.5)
Chloride: 103 mEq/L (ref 96–112)
GLUCOSE: 88 mg/dL (ref 70–99)
Potassium: 4.3 mEq/L (ref 3.5–5.3)
Sodium: 134 mEq/L — ABNORMAL LOW (ref 135–145)
Total Protein: 6.7 g/dL (ref 6.0–8.3)

## 2013-12-16 LAB — URINE CULTURE
COLONY COUNT: NO GROWTH
Organism ID, Bacteria: NO GROWTH

## 2013-12-17 LAB — GC/CHLAMYDIA PROBE AMP
CT Probe RNA: NEGATIVE
GC PROBE AMP APTIMA: NEGATIVE

## 2013-12-28 ENCOUNTER — Encounter: Payer: Self-pay | Admitting: Family Medicine

## 2014-01-17 ENCOUNTER — Other Ambulatory Visit: Payer: Self-pay | Admitting: Family Medicine

## 2014-01-17 ENCOUNTER — Ambulatory Visit
Admission: RE | Admit: 2014-01-17 | Discharge: 2014-01-17 | Disposition: A | Discharge status: Home / Self Care | Payer: BC Managed Care – PPO | Source: Ambulatory Visit | Attending: Family Medicine | Admitting: Family Medicine

## 2014-01-17 DIAGNOSIS — M25562 Pain in left knee: Secondary | ICD-10-CM

## 2014-01-17 DIAGNOSIS — M542 Cervicalgia: Secondary | ICD-10-CM

## 2014-01-17 DIAGNOSIS — M25561 Pain in right knee: Secondary | ICD-10-CM

## 2014-01-17 DIAGNOSIS — M545 Low back pain: Secondary | ICD-10-CM

## 2014-01-19 ENCOUNTER — Encounter (HOSPITAL_COMMUNITY): Payer: Self-pay | Admitting: Cardiovascular Disease

## 2014-01-21 ENCOUNTER — Ambulatory Visit (INDEPENDENT_AMBULATORY_CARE_PROVIDER_SITE_OTHER): Payer: BC Managed Care – PPO | Admitting: Internal Medicine

## 2014-01-21 VITALS — BP 100/56 | HR 61 | Temp 98.2°F | Resp 18 | Ht 74.0 in | Wt 214.0 lb

## 2014-01-21 DIAGNOSIS — R109 Unspecified abdominal pain: Secondary | ICD-10-CM

## 2014-01-21 DIAGNOSIS — M545 Low back pain, unspecified: Secondary | ICD-10-CM

## 2014-01-21 DIAGNOSIS — N390 Urinary tract infection, site not specified: Secondary | ICD-10-CM

## 2014-01-21 LAB — POCT URINALYSIS DIPSTICK
Glucose, UA: NEGATIVE
LEUKOCYTES UA: NEGATIVE
Nitrite, UA: POSITIVE
Protein, UA: 100
RBC UA: NEGATIVE
Spec Grav, UA: 1.015
Urobilinogen, UA: 1
pH, UA: 5.5

## 2014-01-21 LAB — POCT UA - MICROSCOPIC ONLY
CRYSTALS, UR, HPF, POC: NEGATIVE
Casts, Ur, LPF, POC: NEGATIVE
RBC, URINE, MICROSCOPIC: NEGATIVE
Yeast, UA: NEGATIVE

## 2014-01-21 MED ORDER — CIPROFLOXACIN HCL 500 MG PO TABS: 500.0000 mg | ORAL_TABLET | Freq: Two times a day (BID) | ORAL | Status: DC

## 2014-01-21 MED ORDER — TRAMADOL HCL 50 MG PO TABS: 50.0000 mg | ORAL_TABLET | Freq: Three times a day (TID) | ORAL | Status: DC | PRN

## 2014-01-21 NOTE — Patient Instructions (Signed)

## 2014-01-21 NOTE — Progress Notes (Signed)
   Subjective:    Patient ID: Timothy Olsen, male    DOB: January 20, 1956, 58 y.o.   MRN: [id number]  HPI Convinced he has uti and was not treated long enough after last visit. Does not want another prostate exam today. He did not have prostatitis last time. Has right flank pain, no abdominal pain. No N or V.   Review of Systems     Objective:   Physical Exam  Constitutional: He is oriented to person, place, and time. He appears well-developed and well-nourished. He appears distressed.  HENT:  Head: Normocephalic.  Eyes: EOM are normal. No scleral icterus.  Neck: Normal range of motion.  Cardiovascular: Normal rate.   Pulmonary/Chest: Effort normal.  Abdominal: Soft. There is no tenderness. There is no guarding.  Musculoskeletal: He exhibits tenderness.  Neurological: He is alert and oriented to person, place, and time. He exhibits normal muscle tone. Coordination normal.  Psychiatric: He has a normal mood and affect.     Results for orders placed or performed in visit on 01/21/14  POCT urinalysis dipstick  Result Value Ref Range   Color, UA yellow    Clarity, UA clear    Glucose, UA neg    Bilirubin, UA moderate    Ketones, UA trace    Spec Grav, UA 1.015    Blood, UA neg    pH, UA 5.5    Protein, UA 100    Urobilinogen, UA 1.0    Nitrite, UA positive    Leukocytes, UA Negative   POCT UA - Microscopic Only  Result Value Ref Range   WBC, Ur, HPF, POC 1-3    RBC, urine, microscopic neg    Bacteria, U Microscopic trace    Mucus, UA trace    Epithelial cells, urine per micros 0-3    Crystals, Ur, HPF, POC neg    Casts, Ur, LPF, POC neg    Yeast, UA neg         Assessment & Plan:  UTI LBP Cipro/Tramadol

## 2014-01-22 LAB — URINE CULTURE: Colony Count: 1000

## 2014-01-24 ENCOUNTER — Telehealth: Payer: Self-pay | Admitting: *Deleted

## 2014-01-24 ENCOUNTER — Ambulatory Visit: Payer: BC Managed Care – PPO | Attending: Family Medicine | Admitting: Physical Therapy

## 2014-01-24 ENCOUNTER — Ambulatory Visit: Payer: BC Managed Care – PPO | Admitting: Physical Therapy

## 2014-01-24 DIAGNOSIS — S86912A Strain of unspecified muscle(s) and tendon(s) at lower leg level, left leg, initial encounter: Secondary | ICD-10-CM

## 2014-01-24 DIAGNOSIS — E118 Type 2 diabetes mellitus with unspecified complications: Secondary | ICD-10-CM | POA: Diagnosis not present

## 2014-01-24 DIAGNOSIS — Z5189 Encounter for other specified aftercare: Secondary | ICD-10-CM | POA: Insufficient documentation

## 2014-01-24 DIAGNOSIS — I252 Old myocardial infarction: Secondary | ICD-10-CM | POA: Diagnosis not present

## 2014-01-24 DIAGNOSIS — M109 Gout, unspecified: Secondary | ICD-10-CM | POA: Insufficient documentation

## 2014-01-24 DIAGNOSIS — I10 Essential (primary) hypertension: Secondary | ICD-10-CM | POA: Diagnosis not present

## 2014-01-24 DIAGNOSIS — S139XXA Sprain of joints and ligaments of unspecified parts of neck, initial encounter: Secondary | ICD-10-CM

## 2014-01-24 DIAGNOSIS — I4891 Unspecified atrial fibrillation: Secondary | ICD-10-CM | POA: Insufficient documentation

## 2014-01-24 DIAGNOSIS — S86812D Strain of other muscle(s) and tendon(s) at lower leg level, left leg, subsequent encounter: Secondary | ICD-10-CM | POA: Diagnosis not present

## 2014-01-24 DIAGNOSIS — I255 Ischemic cardiomyopathy: Secondary | ICD-10-CM | POA: Insufficient documentation

## 2014-01-24 DIAGNOSIS — E785 Hyperlipidemia, unspecified: Secondary | ICD-10-CM | POA: Diagnosis not present

## 2014-01-24 DIAGNOSIS — S161XXD Strain of muscle, fascia and tendon at neck level, subsequent encounter: Secondary | ICD-10-CM | POA: Diagnosis not present

## 2014-01-24 DIAGNOSIS — S86911A Strain of unspecified muscle(s) and tendon(s) at lower leg level, right leg, initial encounter: Secondary | ICD-10-CM

## 2014-01-24 DIAGNOSIS — S139XXD Sprain of joints and ligaments of unspecified parts of neck, subsequent encounter: Secondary | ICD-10-CM | POA: Diagnosis not present

## 2014-01-24 DIAGNOSIS — S86811D Strain of other muscle(s) and tendon(s) at lower leg level, right leg, subsequent encounter: Secondary | ICD-10-CM | POA: Insufficient documentation

## 2014-01-24 DIAGNOSIS — I2510 Atherosclerotic heart disease of native coronary artery without angina pectoris: Secondary | ICD-10-CM | POA: Diagnosis not present

## 2014-01-24 DIAGNOSIS — M62830 Muscle spasm of back: Secondary | ICD-10-CM

## 2014-01-24 NOTE — Telephone Encounter (Signed)
appts made and printed. Pt only want to schedule 3 appts for now...td

## 2014-01-24 NOTE — Patient Instructions (Signed)
Cryotherapy Cryotherapy means treatment with cold. Ice or gel packs can be used to reduce both pain and swelling. Ice is the most helpful within the first 24 to 48 hours after an injury or flare-up from overusing a muscle or joint. Sprains, strains, spasms, burning pain, shooting pain, and aches can all be eased with ice. Ice can also be used when recovering from surgery. Ice is effective, has very few side effects, and is safe for most people to use. PRECAUTIONS  Ice is not a safe treatment option for people with:  Raynaud phenomenon. This is a condition affecting small blood vessels in the extremities. Exposure to cold may cause your problems to return.  Cold hypersensitivity. There are many forms of cold hypersensitivity, including:  Cold urticaria. Red, itchy hives appear on the skin when the tissues begin to warm after being iced.  Cold erythema. This is a red, itchy rash caused by exposure to cold.  Cold hemoglobinuria. Red blood cells break down when the tissues begin to warm after being iced. The hemoglobin that carry oxygen are passed into the urine because they cannot combine with blood proteins fast enough.  Numbness or altered sensitivity in the area being iced. If you have any of the following conditions, do not use ice until you have discussed cryotherapy with your caregiver:  Heart conditions, such as arrhythmia, angina, or chronic heart disease.  High blood pressure.  Healing wounds or open skin in the area being iced.  Current infections.  Rheumatoid arthritis.  Poor circulation.  Diabetes. Ice slows the blood flow in the region it is applied. This is beneficial when trying to stop inflamed tissues from spreading irritating chemicals to surrounding tissues. However, if you expose your skin to cold temperatures for too long or without the proper protection, you can damage your skin or nerves. Watch for signs of skin damage due to cold. HOME CARE INSTRUCTIONS Follow  these tips to use ice and cold packs safely.  Place a dry or damp towel between the ice and skin. A damp towel will cool the skin more quickly, so you may need to shorten the time that the ice is used.  For a more rapid response, add gentle compression to the ice.  Ice for no more than 10 to 20 minutes at a time. The bonier the area you are icing, the less time it will take to get the benefits of ice.  Check your skin after 5 minutes to make sure there are no signs of a poor response to cold or skin damage.  Rest 20 minutes or more between uses.  Once your skin is numb, you can end your treatment. You can test numbness by very lightly touching your skin. The touch should be so light that you do not see the skin dimple from the pressure of your fingertip. When using ice, most people will feel these normal sensations in this order: cold, burning, aching, and numbness.  Do not use ice on someone who cannot communicate their responses to pain, such as small children or people with dementia. HOW TO MAKE AN ICE PACK Ice packs are the most common way to use ice therapy. Other methods include ice massage, ice baths, and cryosprays. Muscle creams that cause a cold, tingly feeling do not offer the same benefits that ice offers and should not be used as a substitute unless recommended by your caregiver. To make an ice pack, do one of the following:  Place crushed ice or a  bag of frozen vegetables in a sealable plastic bag. Squeeze out the excess air. Place this bag inside another plastic bag. Slide the bag into a pillowcase or place a damp towel between your skin and the bag.  Mix 3 parts water with 1 part rubbing alcohol. Freeze the mixture in a sealable plastic bag. When you remove the mixture from the freezer, it will be slushy. Squeeze out the excess air. Place this bag inside another plastic bag. Slide the bag into a pillowcase or place a damp towel between your skin and the bag. SEEK MEDICAL CARE  IF:  You develop white spots on your skin. This may give the skin a blotchy (mottled) appearance.  Your skin turns blue or pale.  Your skin becomes waxy or hard.  Your swelling gets worse. MAKE SURE YOU:   Understand these instructions.  Will watch your condition.  Will get help right away if you are not doing well or get worse. Document Released: 09/23/2010 Document Revised: 06/13/2013 Document Reviewed: 09/23/2010 [facility] Patient Information 2015 [place] [place]. This information is not intended to replace advice given to you by your health care provider. Make sure you discuss any questions you have with your health care provider.  Heel Slide   Bend left knee and pull heel toward buttocks.  Use a strap or sheet to assist with bending Repeat __5__ times. Do _3-5___ sessions per day for both knees   http://gt2.exer.us/300   Copyright  VHI. All rights reserved.  AROM: Neck Rotation   Turn head slowly to look over one shoulder, then the other. Hold each position __3__ seconds. Repeat __3__ times per set. Do 1____ sets per session. Do __3-5__ sessions per day.  http://orth.exer.us/294   Copyright  VHI. All rights reserved.  AROM: Lateral Neck Flexion   Slowly tilt head toward one shoulder, then the other. Hold each position _3___ seconds. Repeat _3___ times per set. Do __1__ sets per session. Do ___3-5_ sessions per day.  http://orth.exer.us/296   Copyright  VHI. All rights reserved.  Extension   Hands behind neck, bend head back as far as is comfortable. Hold 3____ seconds. Repeat __3__ times. Do ___3-5_ sessions per day.  Copyright  VHI. All rights reserved.  AROM: Neck Flexion   Bend head forward. Hold __3__ seconds. Repeat _3___ times per set. Do _1___ sets per session. Do __3-5__ sessions per day.  http://orth.exer.us/298   Copyright  VHI. All rights reserved.
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
## 2014-01-31 ENCOUNTER — Ambulatory Visit: Payer: BC Managed Care – PPO | Admitting: Physical Therapy

## 2014-01-31 DIAGNOSIS — Z5189 Encounter for other specified aftercare: Secondary | ICD-10-CM | POA: Diagnosis not present

## 2014-01-31 DIAGNOSIS — S139XXA Sprain of joints and ligaments of unspecified parts of neck, initial encounter: Secondary | ICD-10-CM

## 2014-01-31 DIAGNOSIS — M62830 Muscle spasm of back: Secondary | ICD-10-CM

## 2014-01-31 NOTE — Therapy (Signed)
[facility] [address] [place], [place], [address] Phone: [phone]   Fax:  [phone]  Physical Therapy Treatment  Patient Details  Name: Timothy Olsen MRN: [id number] Date of Birth: [birth year]  Encounter Date: 01/31/2014      PT End of Session - 01/31/14 1400    Visit Number 2   Number of Visits 16   Date for PT Re-Evaluation 02/24/14   PT Start Time 1333   PT Stop Time 1410   PT Time Calculation (min) 37 min   Activity Tolerance Patient limited by pain   Behavior During Therapy Agitated      Past Medical History  Diagnosis Date  . Hypertension   . Diabetes mellitus   . CAD (coronary artery disease)     LHC 7/13: mOM1 50-75%, ostial subbranch 80-90% (very small), PL branch 80-90% (very small), mRCA 40-50%, EF 30% => med Rx for small vessel disease  . Atrial fibrillation     CHADS2=3; Xarelto started 7/13; s/p TEE-DCCV => NSR;  patient back in AF with RVR at f/u 8/13 => amiodarone started => back in NSR 9/13 f/u OV  . Ischemic cardiomyopathy     echo 7/13: mild LVH, EF 25-30%, global HK with severe Hk to AK of basal to mid inf and post walls, severe diast dysfxn, mild MR, mod LAE, mod reduced RV fxn, mod RAE, PASP 50 (mild to mod pulmo HTN);  TEE 7/13 with EF 30-35%  . Epistaxis 08/2011    On heparin and Coumadin s/p silver nitrate cauterization L nare  . Hyperlipidemia   . Chronic combined systolic and diastolic CHF (congestive heart failure)   . Gout   . Tobacco abuse   . Alcohol abuse   . MI (myocardial infarction)     Past Surgical History  Procedure Laterality Date  . Tonsillectomy    . Left thumb surgery    . Adenoidectomy    . Tee without cardioversion  09/15/2011    Procedure: TRANSESOPHAGEAL ECHOCARDIOGRAM (TEE);  Surgeon: Lewayne BuntingBrian S Crenshaw, MD;  [facility];  Service: Cardiovascular;  Laterality: N/A;  . Cardioversion  09/15/2011    Procedure: CARDIOVERSION;  Surgeon: Lewayne BuntingBrian S Crenshaw,  MD;  [facility];  Service: Cardiovascular;  Laterality: N/A;  . Cardiac catheterization  09/09/2011    a) widely patent left main with minor luminal irregularities, patent LAD, mild prox LAD calcification and luminal irregularities, patent D1, diffuse prox and LCX- mid irregularity, 50-75% mid OM1 stenosis, 80-90% ostial stenosis in small subbranch, small PLB 80-90% stenosis, RCA- mild proximal luminal irregularity, mid 40-50% stenosis, distal irregularity, PDA and PLB patent; LVEF 30% and global L  . Left heart catheterization with coronary angiogram N/A 09/09/2011    Procedure: LEFT HEART CATHETERIZATION WITH CORONARY ANGIOGRAM;  Surgeon: Tonny BollmanMichael Cooper, MD;  [facility];  Service: Cardiovascular;  Laterality: N/A;    There were no vitals taken for this visit.  Visit Diagnosis:  Neck sprain and strain, initial encounter  Lumbar paraspinal muscle spasm      Subjective Assessment - 01/31/14 1341    Symptoms reports using sheet to knee motion made his feet swell.  Feels back is 9/10 neck 8/10, knees 8/10.   Pain Score 8    Pain Location Back   Pain Orientation Lower   Pain Descriptors / Indicators --  bone hurts on both sides   Pain Type Chronic pain;Acute pain   Pain Onset 1 to 4 weeks ago   Aggravating  Factors  moving   Pain Relieving Factors aleve, heat   Multiple Pain Sites Yes   Pain Score 8   Pain Location Neck   Pain Orientation Left;Right   Pain Descriptors / Indicators --  constant regular pain   Pain Frequency Constant   Pain Score 7   Pain Location Knee   Pain Orientation Left;Right  Lt knee hurts more than Rt,  ice helps   Pain Descriptors / Indicators Sore                    [facility] Adult PT Treatment/Exercise - 01/31/14 1355    Modalities   Modalities Moist Heat   Moist Heat Therapy   Number Minutes Moist Heat 15 Minutes   Moist Heat Location --  neck , back                  PT Short Term Goals - 01/31/14 1403     PT SHORT TERM GOAL #1   Title "Independent with initial HEP   Time 4   Period Weeks   Status On-going   PT SHORT TERM GOAL #2   Title "Report pain decrease from neck/back 8 /10 to 5/10 and knees from 10/10 to 6/10   Time 4   Period Weeks   Status On-going   PT SHORT TERM GOAL #3   Title "Demonstrate understanding of proper sitting posture and be more conscious of position and posture throughout the day   Time 4   Period Weeks   Status Unable to assess           PT Long Term Goals - 01/24/14 1814    PT LONG TERM GOAL #1   Title "Demonstrate and verbalize techniques to reduce the risk of re-injury including: lifting, posture, body mechanics.    PT LONG TERM GOAL #2   Title "Pt will be independent with advanced HEP   PT LONG TERM GOAL #3   Title "Pt will tolerate sitting 1 hour without increased pain to ride in car without increased pain   PT LONG TERM GOAL #4   Title "FOTO will improve from  59%  to  36%   indicating improved functional mobility   Time 8   Period Weeks   Status New   PT LONG TERM GOAL #5   Title "Pt will tolerate walking for 2 hours without increased pain in order to return to PLOF and work    Time 8   Period Weeks   Status New               Plan - 01/31/14 1401    Clinical Impression Statement Patient refused all exercises, he was impatient and demanding.  Moist heat was only treatment today. (neck and back)   PT Next Visit Plan gentle ROM        Problem List Patient Active Problem List   Diagnosis Date Noted  . PVD (peripheral vascular disease) 02/16/2012  . Peripheral neuropathy 02/16/2012  . CAD in native artery 11/26/2011  . Edema of lower extremity 11/26/2011  . Epistaxis 09/16/2011  . Acute on chronic combined systolic and diastolic CHF (congestive heart failure) 09/16/2011  . Atrial fibrillation with rapid ventricular response 09/12/2011  . Non-ST elevation myocardial infarction (NSTEMI), initial episode of care 09/12/2011  .  Ischemic cardiomyopathy 09/12/2011  . Joint swelling 01/23/2011  . Chest pain 01/21/2011  . Diabetes mellitus 01/21/2011  . Hypertension 01/21/2011  . Hyperlipidemia 01/21/2011   Liz Beach,  PTA 01/31/2014 2:05 PM Phone: [phone] Fax: [phone]  [facility] HospitalARRIS,KAREN 01/31/2014, 2:05 PM  [facility] [address] [place], [place], [address] Phone: [phone]   Fax:  [phone]

## 2014-01-31 NOTE — Patient Instructions (Signed)
Advised patient keep moving.  Do gentle stretches  after getting warm from the shower.

## 2014-02-07 ENCOUNTER — Ambulatory Visit: Payer: BC Managed Care – PPO | Admitting: Rehabilitation

## 2014-02-07 DIAGNOSIS — S86912A Strain of unspecified muscle(s) and tendon(s) at lower leg level, left leg, initial encounter: Secondary | ICD-10-CM

## 2014-02-07 DIAGNOSIS — M62830 Muscle spasm of back: Secondary | ICD-10-CM

## 2014-02-07 DIAGNOSIS — S139XXA Sprain of joints and ligaments of unspecified parts of neck, initial encounter: Secondary | ICD-10-CM

## 2014-02-07 DIAGNOSIS — S86911A Strain of unspecified muscle(s) and tendon(s) at lower leg level, right leg, initial encounter: Secondary | ICD-10-CM

## 2014-02-07 DIAGNOSIS — Z5189 Encounter for other specified aftercare: Secondary | ICD-10-CM | POA: Diagnosis not present

## 2014-02-07 NOTE — Therapy (Addendum)
[place], [place], [address] Phone: [phone]   Fax:  [phone]  Physical Therapy Treatment/Discharge Note  Patient Details  Name: Timothy Olsen MRN: [id number] Date of Birth: 29-Apr-1955  Encounter Date: 02/07/2014      PT End of Session - 02/07/14 1409    Visit Number 3   Number of Visits 16   Date for PT Re-Evaluation 03/27/14   PT Start Time 0130   PT Stop Time 0215   PT Time Calculation (min) 45 min      Past Medical History  Diagnosis Date  . Hypertension   . Diabetes mellitus   . CAD (coronary artery disease)     LHC 7/13: mOM1 50-75%, ostial subbranch 80-90% (very small), PL branch 80-90% (very small), mRCA 40-50%, EF 30% => med Rx for small vessel disease  . Atrial fibrillation     CHADS2=3; Xarelto started 7/13; s/p TEE-DCCV => NSR;  patient back in AF with RVR at f/u 8/13 => amiodarone started => back in NSR 9/13 f/u OV  . Ischemic cardiomyopathy     echo 7/13: mild LVH, EF 25-30%, global HK with severe Hk to AK of basal to mid inf and post walls, severe diast dysfxn, mild MR, mod LAE, mod reduced RV fxn, mod RAE, PASP 50 (mild to mod pulmo HTN);  TEE 7/13 with EF 30-35%  . Epistaxis 08/2011    On heparin and Coumadin s/p silver nitrate cauterization L nare  . Hyperlipidemia   . Chronic combined systolic and diastolic CHF (congestive heart failure)   . Gout   . Tobacco abuse   . Alcohol abuse   . MI (myocardial infarction)     Past Surgical History  Procedure Laterality Date  . Tonsillectomy    . Left thumb surgery    . Adenoidectomy    . Tee without cardioversion  09/15/2011    Procedure: TRANSESOPHAGEAL ECHOCARDIOGRAM (TEE);  Surgeon: Lelon Perla, MD;  Location: [facility] ENDOSCOPY;  Service: Cardiovascular;  Laterality: N/A;  . Cardioversion  09/15/2011    Procedure: CARDIOVERSION;  Surgeon: Lelon Perla, MD;  Location: [facility] ENDOSCOPY;  Service: Cardiovascular;  Laterality:  N/A;  . Cardiac catheterization  09/09/2011    a) widely patent left main with minor luminal irregularities, patent LAD, mild prox LAD calcification and luminal irregularities, patent D1, diffuse prox and LCX- mid irregularity, 50-75% mid OM1 stenosis, 80-90% ostial stenosis in small subbranch, small PLB 80-90% stenosis, RCA- mild proximal luminal irregularity, mid 40-50% stenosis, distal irregularity, PDA and PLB patent; LVEF 30% and global L  . Left heart catheterization with coronary angiogram N/A 09/09/2011    Procedure: LEFT HEART CATHETERIZATION WITH CORONARY ANGIOGRAM;  Surgeon: Sherren Mocha, MD;  Location: [facility] CATH LAB;  Service: Cardiovascular;  Laterality: N/A;    There were no vitals taken for this visit.  Visit Diagnosis:  Neck sprain and strain, initial encounter  Lumbar paraspinal muscle spasm  Knee strain, left, initial encounter  Knee strain, right, initial encounter      Subjective Assessment - 02/07/14 1337    Symptoms I have been walking a little more and stretching my neck, my neck and knees feel a little better. My back hurt real bad when it got cold. Neck 6/10, back 7/10 knees 4/10   Aggravating Factors  cold weather   Pain Relieving Factors heat          [facility] PT Assessment - 02/07/14 1351    AROM  Cervical - Right Rotation 40   Cervical - Left Rotation 40                  [facility] Adult PT Treatment/Exercise - 02/07/14 1352    Exercises   Exercises --   Neck Exercises: Stretches   Other Neck Stretches AROM x 10 SB and cerv rotation   Lumbar Exercises: Stretches   Single Knee to Chest Stretch 3 reps;30 seconds   Lower Trunk Rotation 5 reps;10 seconds   Lumbar Exercises: Aerobic   Stationary Bike Nustep Level 2 UE LE x 6 min   Knee/Hip Exercises: Supine   Short Arc Quad Sets 20 reps   Shoulder Exercises: Seated   Retraction AROM;10 reps   Other Seated Exercises shoulder rolls x 10 each way   Modalities   Modalities Moist Heat   Moist  Heat Therapy   Number Minutes Moist Heat 15 Minutes   Moist Heat Location --  neck and back                PT Education - 02/07/14 1409    Education provided Yes   Education Details HEP, Keep moving    Person(s) Educated Patient   Methods Explanation;Handout   Comprehension Verbalized understanding          PT Short Term Goals - 02/07/14 1711    PT SHORT TERM GOAL #1   Title "Independent with initial HEP   Time 4   Period Weeks   Status Achieved   PT SHORT TERM GOAL #2   Title "Report pain decrease from neck/back 8 /10 to 5/10 and knees from 10/10 to 6/10   Time 4   Period Weeks   Status Partially Met   PT SHORT TERM GOAL #3   Title "Demonstrate understanding of proper sitting posture and be more conscious of position and posture throughout the day   Time 4   Period Weeks   Status Unable to assess           PT Long Term Goals - 02/07/14 1711    PT LONG TERM GOAL #1   Title "Demonstrate and verbalize techniques to reduce the risk of re-injury including: lifting, posture, body mechanics.    Time 8   Period Weeks   Status On-going   PT LONG TERM GOAL #2   Title "Pt will be independent with advanced HEP   Time 8   Period Weeks   Status On-going   PT LONG TERM GOAL #3   Title "Pt will tolerate sitting 1 hour without increased pain to ride in car without increased pain   Time 8   Period Weeks   Status On-going   PT LONG TERM GOAL #4   Title "FOTO will improve from  59%  to  36%   indicating improved functional mobility   Time 8   Period Weeks   Status On-going   PT LONG TERM GOAL #5   Title "Pt will tolerate walking for 2 hours without increased pain in order to return to PLOF and work    Time 8   Period Weeks   Status On-going               Plan - 02/07/14 1709    Clinical Impression Statement able to begin aerobic and flexibility exercises in clinic today and add to HEP with improved pt tolerance to therex. Cervical rotation improved.  STG #1 MET   PT Next Visit Plan continue therex,progress gently, continue  Nustep and heat        Problem List Patient Active Problem List   Diagnosis Date Noted  . PVD (peripheral vascular disease) 02/16/2012  . Peripheral neuropathy 02/16/2012  . CAD in native artery 11/26/2011  . Edema of lower extremity 11/26/2011  . Epistaxis 09/16/2011  . Acute on chronic combined systolic and diastolic CHF (congestive heart failure) 09/16/2011  . Atrial fibrillation with rapid ventricular response 09/12/2011  . Non-ST elevation myocardial infarction (NSTEMI), initial episode of care 09/12/2011  . Ischemic cardiomyopathy 09/12/2011  . Joint swelling 01/23/2011  . Chest pain 01/21/2011  . Diabetes mellitus 01/21/2011  . Hypertension 01/21/2011  . Hyperlipidemia 01/21/2011    Dorene Ar, PTA 02/07/2014, 5:21 PM  [place], [place], [address] Phone: [phone]   Fax:  [phone]  PHYSICAL THERAPY DISCHARGE SUMMARY  Visits from Start of Care: 3 Current functional level related to goals / functional outcomes: Unknown   Remaining deficits: Unknown   Education / Equipment: Initial HEP Plan:                                                    Patient goals were partially met. Patient is being discharged due to not returning since the last visit.  ?????       Voncille Lo, PT 12/21/2014 12:39 PM Phone: [phone] Fax: [phone]

## 2014-02-07 NOTE — Patient Instructions (Signed)
Knee to Chest (Flexion)   Pull knee toward chest. Feel stretch in lower back or buttock area. Breathing deeply, Hold _20-30___ seconds. Repeat with other knee. Repeat __3__ times each leg. Do ___2_ sessions per day.  http://gt2.exer.us/225   Copyright  VHI. All rights reserved.   Lower Trunk Rotation Stretch   Keeping back flat and feet together, rotate knees to left side. Hold __10__ seconds. Repeat 3____ times each way. Do _2___ sets per session. Do _2___ sessions per day.  http://orth.exer.us/122   Copyright  VHI. All rights reserved.

## 2014-02-09 ENCOUNTER — Ambulatory Visit: Payer: BC Managed Care – PPO | Admitting: Rehabilitation

## 2014-02-16 ENCOUNTER — Ambulatory Visit: Payer: No Typology Code available for payment source | Admitting: Physical Therapy

## 2014-08-16 ENCOUNTER — Encounter: Payer: Self-pay | Admitting: Cardiovascular Disease

## 2014-08-16 ENCOUNTER — Ambulatory Visit (INDEPENDENT_AMBULATORY_CARE_PROVIDER_SITE_OTHER): Payer: Medicare Other | Admitting: Cardiovascular Disease

## 2014-08-16 VITALS — BP 120/70 | HR 108 | Ht 76.0 in | Wt 221.8 lb

## 2014-08-16 DIAGNOSIS — I10 Essential (primary) hypertension: Secondary | ICD-10-CM | POA: Diagnosis not present

## 2014-08-16 DIAGNOSIS — E785 Hyperlipidemia, unspecified: Secondary | ICD-10-CM

## 2014-08-16 DIAGNOSIS — I4891 Unspecified atrial fibrillation: Secondary | ICD-10-CM

## 2014-08-16 DIAGNOSIS — I255 Ischemic cardiomyopathy: Secondary | ICD-10-CM | POA: Diagnosis not present

## 2014-08-16 MED ORDER — METOPROLOL TARTRATE 50 MG PO TABS: 50.0000 mg | ORAL_TABLET | Freq: Two times a day (BID) | ORAL | Status: DC

## 2014-08-16 NOTE — Patient Instructions (Signed)
Medication Instructions:  Your physician has recommended you make the following change in your medication:  1. INCREASE Metoprolol Tartrate to  take one by mouth twice a day  Labwork: No new orders.   Testing/Procedures: Your physician has requested that you have an echocardiogram. Echocardiography is a painless test that uses sound waves to create images of your heart. It provides your doctor with information about the size and shape of your heart and how well your heart's chambers and valves are working. This procedure takes approximately one hour. There are no restrictions for this procedure.  Follow-Up: Your physician recommends that you schedule a follow-up appointment in: 1 WEEK with PA/NP   Any Other Special Instructions Will Be Listed Below (If Applicable).

## 2014-08-16 NOTE — Progress Notes (Signed)
Cardiology Office Note Date:  08/16/2014   ID:  Timothy Olsen, DOB [birth year], MRN [id number]  PCP:  Gwynneth Aliment, MD  Cardiologist:  Tonny Bollman, MD    Chief Complaint  Patient presents with  . Leg Swelling    History of Present Illness: Timothy Olsen is a 59 y.o. male who presents for follow-up evaluation. He is followed for cardiomyopathy, hypertension, and diffuse small vessel coronary artery disease. He is limited by peripheral neuropathy related to long-standing diabetes. He had atrial fibrillation back in 2013 but was unable to tolerate any anticoagulant medications. He was tried on Coumadin and Xarelto. The patient was last seen one year ago. Says he 'almost bled out' on both drugs. Complained of severe epistaxis and is unwilling to try any other anticoagulant drugs.  The patient has noticed that his heart rate is generally in the 80s and 90s over the past week. He checks his blood pressure a few times daily. His baseline heart rate is generally about 60 bpm. He does not experience any heart palpitations. He denies chest pain or shortness of breath. He is largely inactive because of peripheral neuropathy and foot problems. He denies orthopnea or PND. He complains of chronic ankle swelling.   Past Medical History  Diagnosis Date  . Hypertension   . Diabetes mellitus   . CAD (coronary artery disease)     LHC 7/13: mOM1 50-75%, ostial subbranch 80-90% (very small), PL branch 80-90% (very small), mRCA 40-50%, EF 30% => med Rx for small vessel disease  . Atrial fibrillation     CHADS2=3; Xarelto started 7/13; s/p TEE-DCCV => NSR;  patient back in AF with RVR at f/u 8/13 => amiodarone started => back in NSR 9/13 f/u OV  . Ischemic cardiomyopathy     echo 7/13: mild LVH, EF 25-30%, global HK with severe Hk to AK of basal to mid inf and post walls, severe diast dysfxn, mild MR, mod LAE, mod reduced RV fxn, mod RAE, PASP 50 (mild to mod pulmo HTN);  TEE 7/13 with EF 30-35%    . Epistaxis 08/2011    On heparin and Coumadin s/p silver nitrate cauterization L nare  . Hyperlipidemia   . Chronic combined systolic and diastolic CHF (congestive heart failure)   . Gout   . Tobacco abuse   . Alcohol abuse   . MI (myocardial infarction)     Past Surgical History  Procedure Laterality Date  . Tonsillectomy    . Left thumb surgery    . Adenoidectomy    . Tee without cardioversion  09/15/2011    Procedure: TRANSESOPHAGEAL ECHOCARDIOGRAM (TEE);  Surgeon: Lewayne Bunting, MD;  Location: [facility] ENDOSCOPY;  Service: Cardiovascular;  Laterality: N/A;  . Cardioversion  09/15/2011    Procedure: CARDIOVERSION;  Surgeon: Lewayne Bunting, MD;  Location: [facility] ENDOSCOPY;  Service: Cardiovascular;  Laterality: N/A;  . Cardiac catheterization  09/09/2011    a) widely patent left main with minor luminal irregularities, patent LAD, mild prox LAD calcification and luminal irregularities, patent D1, diffuse prox and LCX- mid irregularity, 50-75% mid OM1 stenosis, 80-90% ostial stenosis in small subbranch, small PLB 80-90% stenosis, RCA- mild proximal luminal irregularity, mid 40-50% stenosis, distal irregularity, PDA and PLB patent; LVEF 30% and global L  . Left heart catheterization with coronary angiogram N/A 09/09/2011    Procedure: LEFT HEART CATHETERIZATION WITH CORONARY ANGIOGRAM;  Surgeon: Tonny Bollman, MD;  Location: [facility] CATH LAB;  Service: Cardiovascular;  Laterality: N/A;  Current Outpatient Prescriptions  Medication Sig Dispense Refill  . allopurinol (ZYLOPRIM) 100 MG tablet Take 100 mg by mouth daily as needed (for gout).    Marland Kitchen amLODipine (NORVASC) 10 MG tablet Take 1 tablet (10 mg total) by mouth daily. 30 tablet 11  . aspirin EC 81 MG tablet Take 81 mg by mouth 2 (two) times a week.  1 tablet 0  . colchicine 0.6 MG tablet Take 0.6 mg by mouth daily as needed. For gout symptoms    . glipiZIDE (GLUCOTROL) 5 MG tablet Take 5 mg by mouth 2 (two) times daily before a meal.      .  hydrALAZINE (APRESOLINE) 25 MG tablet Take 1 tablet (25 mg total) by mouth 3 (three) times daily. 90 tablet 11  . indomethacin (INDOCIN) 25 MG capsule Take 1 capsule (25 mg total) by mouth 3 (three) times daily with meals. 90 capsule 6  . lisinopril (PRINIVIL,ZESTRIL) 40 MG tablet Take 1 tablet (40 mg total) by mouth daily. 30 tablet 11  . metFORMIN (GLUCOPHAGE) 1000 MG tablet Take 1,000 mg by mouth as directed. Sliding Scale    . metoprolol (LOPRESSOR) 50 MG tablet TAKE ONE-HALF TABLET BY MOUTH TWICE DAILY 30 tablet 11  . nitroGLYCERIN (NITROSTAT) 0.4 MG SL tablet Place 1 tablet (0.4 mg total) under the tongue every 5 (five) minutes as needed. For chest pain 25 tablet 2  . pravastatin (PRAVACHOL) 20 MG tablet Take 1 tablet (20 mg total) by mouth daily. 30 tablet 11   No current facility-administered medications for this visit.    Allergies:   Review of patient's allergies indicates no known allergies.   Social History:  The patient  reports that he has quit smoking. His smoking use included Cigarettes. He has a 1 pack-year smoking history. He has never used smokeless tobacco. He reports that he drinks about 3.0 oz of alcohol per week. He reports that he does not use illicit drugs.   Family History:  The patient's  family history includes Diabetes type II in his mother; Hypertension in his father.    ROS:  Please see the history of present illness.  Otherwise, review of systems is positive for leg swelling, diarrhea, muscle pain, constipation, foot pain, balance problems.  All other systems are reviewed and negative.    PHYSICAL EXAM: VS:  BP 120/70 mmHg  Pulse 108  Ht  (1.93 m)  Wt 221 lb 12.8 oz (100.608 kg)  BMI 27.01 kg/m2 , BMI Body mass index is 27.01 kg/(m^2).  On my check his heart rate is 108 bpm GEN: Well nourished, well developed, in no acute distress HEENT: normal Neck: no JVD, no masses. No carotid bruits Cardiac: tachycardic and irregular without murmur or gallop  On  my check the pulse rate is 110 bpm          Respiratory:  clear to auscultation bilaterally, normal work of breathing GI: soft, nontender, nondistended, + BS MS: no deformity or atrophy Ext: no pretibial edema Skin: warm and dry, no rash Neuro:  Strength and sensation are intact Psych: euthymic mood, full affect  EKG:  EKG is ordered today. The ekg ordered today shows Atrial fibrillation with RVR 137 bpm, rare PVC, nonspecific T wave abnormality  Recent Labs: 09/21/2013: Platelets 233 12/15/2013: ALT <8; BUN 11; Creat 0.72; Hemoglobin 13.2*; Potassium 4.3; Sodium 134*   Lipid Panel     Component Value Date/Time   CHOL 155 09/09/2011 0508   TRIG 76 09/09/2011 0508  HDL 41 09/09/2011 0508   CHOLHDL 3.8 09/09/2011 0508   VLDL 15 09/09/2011 0508   LDLCALC 99 09/09/2011 0508      Wt Readings from Last 3 Encounters:  08/16/14 221 lb 12.8 oz (100.608 kg)  01/21/14 214 lb (97.07 kg)  12/15/13 217 lb (98.431 kg)     Cardiac Studies Reviewed: 2D Echo 01/06/2012: Study Conclusions  - Left ventricle: The cavity size was normal. Wall thickness was increased in a pattern of mild LVH. Basal to mid posterior akinesis, anterolateral hypokinesis. Systolic function was mildly reduced. The estimated ejection fraction was in the range of 45% to 50%. Features are consistent with a pseudonormal left ventricular filling pattern, with concomitant abnormal relaxation and increased filling pressure (grade 2 diastolic dysfunction). - Aortic valve: There was no stenosis. - Mitral valve: Trivial regurgitation. - Left atrium: The atrium was mildly to moderately dilated. - Right ventricle: The cavity size was normal. Systolic function was normal. - Right atrium: The atrium was mildly dilated. - Pulmonary arteries: No complete TR doppler jet so unable to estimate PA systolic pressure. Impressions:  - Normal LV size with mild LV hypertrophy. EF 45-50% with basal to mid  posterior akinesis and anterolateral hypokinesis. Normal RV size and systolic function. No significant valvular abnormalities.  Cardiac Cath 09/09/2011: AO 139/97 LV 139/29  Coronary angiography: Coronary dominance: right  Left mainstem: Widely patent with minor luminal irregularity.  Left anterior descending (LAD): Patent to the left ventricular apex. The first diagonal branch is patent. There is mild proximal LAD calcification and also mild luminal irregularity but there are no significant stenoses throughout the course of the LAD distribution.  Left circumflex (LCx): The left circumflex is a large-caliber vessel. The proximal and mid circumflex have mild diffuse irregularity. The first OM is patent but in the midportion of this vessel there is 50-75% stenosis. There is also a subbranch that has tight ostial stenosis but this is a very small vessel. The percent stenosis is estimated at 80-90. The continuation of the AV groove circumflex supplies a posterolateral branch which also has 80-90% stenosis. This is a very small vessel less than 1.5 mm in caliber.  Right coronary artery (RCA): The right coronary artery is dominant. There is mild luminal irregularity in the proximal vessel. The mid vessel has 40-50% stenosis. The distal vessel has luminal irregularity. The PDA and posterolateral branches are patent  Left ventriculography: The left ventricle is poorly visualized with ventriculography, despite a 30 cc injection. However, there is clearly global left ventricular dysfunction and I would estimate the ejection fraction in the range of 30%.  Final Conclusions:  1. 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 Nonobstructive LAD and right coronary artery stenoses 3. Severe global left ventricular dysfunction  Recommendations: Medical therapy. Plan will have to be made regarding anticoagulation in this patient with  atrial fibrillation and cardiomyopathy, but this is complicated by concerns about medicine adherence and alcohol use.   ASSESSMENT AND PLAN: 1.  CAD, native vessel: no angina. Continue current Rx's.   2. Atrial fibrillation with RVR. Appears to be asymptomatic, but suspect he will begin to decompensate without better HR control. He is unwilling to take anticoagulant drugs because of past bleeding experiences on warfarin and DOAC medications. Will double beta-blocker dose to obtain better HR control and bring in for close follow for medication titration. Need to update 2D echocardiogram. May need to readdress the issue of temporary anticoagulation if her  remains in AF for consideration of cardioversion or LAA occlusion therapies.  3. Cardiomyopathy: multifactorial with AF, malignant HTN, and CAD. Medications as outlined above. Check 2D Echo.   4. HTN: controlled.   Current medicines are reviewed with the patient today.  The patient does not have concerns regarding medicines.  Labs/ tests ordered today include:  No orders of the defined types were placed in this encounter.    Disposition:   FU 1-2 weeks with PA/NP  Signed, Tonny Bollmanooper, Mareon Robinette, MD  08/16/2014 9:52 AM    [facility] [address], [place], [place]  [address] Phone: [phone]; Fax: [phone]

## 2014-08-17 ENCOUNTER — Other Ambulatory Visit: Payer: Self-pay

## 2014-08-17 ENCOUNTER — Ambulatory Visit (HOSPITAL_COMMUNITY): Payer: Medicare Other | Attending: Cardiovascular Disease

## 2014-08-17 DIAGNOSIS — I10 Essential (primary) hypertension: Secondary | ICD-10-CM | POA: Diagnosis not present

## 2014-08-17 DIAGNOSIS — Z87891 Personal history of nicotine dependence: Secondary | ICD-10-CM | POA: Insufficient documentation

## 2014-08-17 DIAGNOSIS — I517 Cardiomegaly: Secondary | ICD-10-CM | POA: Insufficient documentation

## 2014-08-17 DIAGNOSIS — E119 Type 2 diabetes mellitus without complications: Secondary | ICD-10-CM | POA: Diagnosis not present

## 2014-08-17 DIAGNOSIS — I255 Ischemic cardiomyopathy: Secondary | ICD-10-CM | POA: Diagnosis not present

## 2014-08-17 DIAGNOSIS — I340 Nonrheumatic mitral (valve) insufficiency: Secondary | ICD-10-CM | POA: Insufficient documentation

## 2014-08-17 DIAGNOSIS — I4891 Unspecified atrial fibrillation: Secondary | ICD-10-CM

## 2014-08-17 DIAGNOSIS — E785 Hyperlipidemia, unspecified: Secondary | ICD-10-CM | POA: Diagnosis not present

## 2014-08-18 ENCOUNTER — Encounter: Payer: Self-pay | Admitting: Cardiovascular Disease

## 2014-08-18 DIAGNOSIS — I119 Hypertensive heart disease without heart failure: Secondary | ICD-10-CM | POA: Diagnosis not present

## 2014-08-18 DIAGNOSIS — E1340 Other specified diabetes mellitus with diabetic neuropathy, unspecified: Secondary | ICD-10-CM | POA: Diagnosis not present

## 2014-08-18 DIAGNOSIS — E782 Mixed hyperlipidemia: Secondary | ICD-10-CM | POA: Diagnosis not present

## 2014-08-23 ENCOUNTER — Ambulatory Visit (INDEPENDENT_AMBULATORY_CARE_PROVIDER_SITE_OTHER): Payer: Medicare Other | Admitting: Nurse Practitioner

## 2014-08-23 ENCOUNTER — Encounter: Payer: Self-pay | Admitting: Nurse Practitioner

## 2014-08-23 VITALS — BP 142/80 | HR 83 | Ht 76.0 in | Wt 222.2 lb

## 2014-08-23 DIAGNOSIS — I480 Paroxysmal atrial fibrillation: Secondary | ICD-10-CM

## 2014-08-23 DIAGNOSIS — I10 Essential (primary) hypertension: Secondary | ICD-10-CM

## 2014-08-23 DIAGNOSIS — I420 Dilated cardiomyopathy: Secondary | ICD-10-CM

## 2014-08-23 LAB — CBC
HCT: 43.2 % (ref 39.0–52.0)
Hemoglobin: 14.2 g/dL (ref 13.0–17.0)
MCHC: 32.8 g/dL (ref 30.0–36.0)
MCV: 90.4 fl (ref 78.0–100.0)
Platelets: 260 10*3/uL (ref 150.0–400.0)
RBC: 4.77 Mil/uL (ref 4.22–5.81)
RDW: 15.7 % — ABNORMAL HIGH (ref 11.5–15.5)
WBC: 7.5 10*3/uL (ref 4.0–10.5)

## 2014-08-23 LAB — BASIC METABOLIC PANEL
BUN: 13 mg/dL (ref 6–23)
CO2: 30 mEq/L (ref 19–32)
Calcium: 9.7 mg/dL (ref 8.4–10.5)
Chloride: 106 mEq/L (ref 96–112)
Creatinine, Ser: 1.05 mg/dL (ref 0.40–1.50)
GFR: 93.42 mL/min (ref >60.00)
Glucose, Bld: 84 mg/dL (ref 70–99)
Potassium: 4.2 mEq/L (ref 3.5–5.1)
Sodium: 143 mEq/L (ref 135–145)

## 2014-08-23 LAB — TSH: TSH: 1.46 u[IU]/mL (ref 0.35–4.50)

## 2014-08-23 MED ORDER — HYDRALAZINE HCL 50 MG PO TABS: 50.0000 mg | ORAL_TABLET | Freq: Three times a day (TID) | ORAL | Status: DC

## 2014-08-23 MED ORDER — AMLODIPINE BESYLATE 10 MG PO TABS: 5.0000 mg | ORAL_TABLET | Freq: Every day | ORAL | Status: DC

## 2014-08-23 NOTE — Patient Instructions (Addendum)
We will be checking the following labs today - TSH, BMET, CBC   Medication Instructions:    Continue with your current medicines but  I am cutting the Norvasc (amlodipine) to half a pill - 5mg  a day  I am increasing the Hydralazine to 50 mg three times a day - this is at your drug store.     Testing/Procedures To Be Arranged:  N/A  Follow-Up:   See Dr. Excell Seltzerooper in 3 months.     Other Special Instructions:   N/A  Call the [facility] office at [phone] if you have any questions, problems or concerns.

## 2014-08-23 NOTE — Progress Notes (Signed)
CARDIOLOGY OFFICE NOTE  Date:  08/23/2014    Timothy Olsen Date of Birth: 07/10/1955 Medical Record #[id number]  PCP:  Gwynneth Aliment, MD  Cardiologist:  Excell Seltzer    Chief Complaint  Patient presents with  . Atrial Fibrillation    1 week check - seen for Dr. Excell Seltzer    History of Present Illness: Timothy Olsen is a 59 y.o. male who presents today for a follow up visit. Seen for Dr. Excell Seltzer. This is a one week check. He is followed for cardiomyopathy, hypertension, and diffuse small vessel coronary artery disease. He is limited by peripheral neuropathy related to long-standing diabetes. He had atrial fibrillation back in 2013 but was unable to tolerate any anticoagulant medications. He was tried on Coumadin and Xarelto. Says he 'almost bled out' on both drugs. Complained of severe epistaxis and has been unwilling to try any other anticoagulant drugs.  Seen earlier this month - reported that his heart rate was higher - in the 80 to 90's. His baseline heart rate is generally about 60 bpm. No palpitations. No chest pain and was not short of breath. He is largely inactive because of peripheral neuropathy and foot problems. He complained of chronic ankle swelling.  Comes back today. Here alone. Pretty argumentative with his history. Talking about his legs swelling and how he needs medicines for the swelling and then says he does not need meds for swelling. No chest pain. Not palpitations. Not aware of his atrial fib. Refuses anticoagulation. He says he is feeling ok.  Says he restricts his salt. No eating out but brought in a bag from [employer] today.    Past Medical History  Diagnosis Date  . Hypertension   . Diabetes mellitus   . CAD (coronary artery disease)     LHC 7/13: mOM1 50-75%, ostial subbranch 80-90% (very small), PL branch 80-90% (very small), mRCA 40-50%, EF 30% => med Rx for small vessel disease  . Atrial fibrillation     CHADS2=3; Xarelto started 7/13; s/p  TEE-DCCV => NSR;  patient back in AF with RVR at f/u 8/13 => amiodarone started => back in NSR 9/13 f/u OV  . Ischemic cardiomyopathy     echo 7/13: mild LVH, EF 25-30%, global HK with severe Hk to AK of basal to mid inf and post walls, severe diast dysfxn, mild MR, mod LAE, mod reduced RV fxn, mod RAE, PASP 50 (mild to mod pulmo HTN);  TEE 7/13 with EF 30-35%  . Epistaxis 08/2011    On heparin and Coumadin s/p silver nitrate cauterization L nare  . Hyperlipidemia   . Chronic combined systolic and diastolic CHF (congestive heart failure)   . Gout   . Tobacco abuse   . Alcohol abuse   . MI (myocardial infarction)     Past Surgical History  Procedure Laterality Date  . Tonsillectomy    . Left thumb surgery    . Adenoidectomy    . Tee without cardioversion  09/15/2011    Procedure: TRANSESOPHAGEAL ECHOCARDIOGRAM (TEE);  Surgeon: Lewayne Bunting, MD;  Location: [facility] ENDOSCOPY;  Service: Cardiovascular;  Laterality: N/A;  . Cardioversion  09/15/2011    Procedure: CARDIOVERSION;  Surgeon: Lewayne Bunting, MD;  Location: [facility] ENDOSCOPY;  Service: Cardiovascular;  Laterality: N/A;  . Cardiac catheterization  09/09/2011    a) widely patent left main with minor luminal irregularities, patent LAD, mild prox LAD calcification and luminal irregularities, patent D1, diffuse prox and LCX- mid irregularity, 50-75%  mid OM1 stenosis, 80-90% ostial stenosis in small subbranch, small PLB 80-90% stenosis, RCA- mild proximal luminal irregularity, mid 40-50% stenosis, distal irregularity, PDA and PLB patent; LVEF 30% and global L  . Left heart catheterization with coronary angiogram N/A 09/09/2011    Procedure: LEFT HEART CATHETERIZATION WITH CORONARY ANGIOGRAM;  Surgeon: Tonny BollmanMichael Cooper, MD;  [facility];  Service: Cardiovascular;  Laterality: N/A;     Medications: Current Outpatient Prescriptions  Medication Sig Dispense Refill  . allopurinol (ZYLOPRIM) 100 MG tablet Take 100 mg by mouth daily as needed  (for gout).    Marland Kitchen. amLODipine (NORVASC) 10 MG tablet Take 0.5 tablets (5 mg total) by mouth daily. 30 tablet 11  . aspirin EC 81 MG tablet Take 81 mg by mouth 2 (two) times a week.  1 tablet 0  . colchicine 0.6 MG tablet Take 0.6 mg by mouth daily as needed. For gout symptoms    . glipiZIDE (GLUCOTROL) 5 MG tablet Take 5 mg by mouth 2 (two) times daily before a meal.      . indomethacin (INDOCIN) 25 MG capsule Take 1 capsule (25 mg total) by mouth 3 (three) times daily with meals. 90 capsule 6  . lisinopril (PRINIVIL,ZESTRIL) 40 MG tablet Take 1 tablet (40 mg total) by mouth daily. 30 tablet 11  . metFORMIN (GLUCOPHAGE) 1000 MG tablet Take 1,000 mg by mouth as directed. Sliding Scale    . metoprolol (LOPRESSOR) 50 MG tablet Take 1 tablet (50 mg total) by mouth 2 (two) times daily. TAKE ONE-HALF TABLET BY MOUTH TWICE DAILY 60 tablet 11  . nitroGLYCERIN (NITROSTAT) 0.4 MG SL tablet Place 1 tablet (0.4 mg total) under the tongue every 5 (five) minutes as needed. For chest pain 25 tablet 2  . pravastatin (PRAVACHOL) 20 MG tablet Take 1 tablet (20 mg total) by mouth daily. 30 tablet 11  . hydrALAZINE (APRESOLINE) 50 MG tablet Take 1 tablet (50 mg total) by mouth 3 (three) times daily. 90 tablet 6   No current facility-administered medications for this visit.    Allergies: No Known Allergies  Social History: The patient  reports that he has quit smoking. His smoking use included Cigarettes. He has a 1 pack-year smoking history. He has never used smokeless tobacco. He reports that he drinks about 3.0 oz of alcohol per week. He reports that he does not use illicit drugs.   Family History: The patient's family history includes Diabetes type II in his mother; Hypertension in his father.   Review of Systems: Please see the history of present illness.   Otherwise, the review of systems is positive for leg swelling which is chronic, diarrhea, muscle pain, balance issues and skipped heart beats.   All  other systems are reviewed and negative.   Physical Exam: VS:  BP 142/80 mmHg  Pulse 83  Ht 6\' 4"  (1.93 m)  Wt 222 lb 3.2 oz (100.789 kg)  BMI 27.06 kg/m2 .  BMI Body mass index is 27.06 kg/(m^2).  Wt Readings from Last 3 Encounters:  08/23/14 222 lb 3.2 oz (100.789 kg)  08/16/14 221 lb 12.8 oz (100.608 kg)  01/21/14 214 lb (97.07 kg)    General: Alert and in no acute distress. His weight is up.   HEENT: Normal but has poor dentition. Neck: Supple, no JVD, carotid bruits, or masses noted.  Cardiac: Irregular irregular rhythm. Rate is ok at 83.   No edema.  Respiratory:  Lungs are clear to auscultation bilaterally with normal work  of breathing.  GI: Soft and nontender.  MS: No deformity or atrophy. Gait and ROM intact. Skin: Warm and dry. Color is normal.  Neuro:  Strength and sensation are intact and no gross focal deficits noted.  Psych: Alert, appropriate and with normal affect.   LABORATORY DATA:  EKG:  EKG is ordered today. This demonstrates atrial fib with rate of 83.  Lab Results  Component Value Date   WBC 7.7 12/15/2013   HGB 13.2* 12/15/2013   HCT 40.9* 12/15/2013   PLT 233 09/21/2013   GLUCOSE 88 12/15/2013   CHOL 155 09/09/2011   TRIG 76 09/09/2011   HDL 41 09/09/2011   LDLCALC 99 09/09/2011   ALT <8 12/15/2013   AST 14 12/15/2013   NA 134* 12/15/2013   K 4.3 12/15/2013   CL 103 12/15/2013   CREATININE 0.72 12/15/2013   BUN 11 12/15/2013   CO2 25 12/15/2013   TSH 0.31* 09/25/2011   INR 2.20* 09/19/2011   HGBA1C 6.9* 09/09/2011    BNP (last 3 results) No results for input(s): BNP in the last 8760 hours.  ProBNP (last 3 results) No results for input(s): PROBNP in the last 8760 hours.   Other Studies Reviewed Today:  Echo Study Conclusions from 08/17/2014  - Left ventricle: The cavity size was mildly dilated. Wall thickness was normal. Systolic function was mildly reduced. The estimated ejection fraction was in the range of 45% to  50%. Hypokinesis of the basal-midinferolateral myocardium. - Mitral valve: There was mild to moderate regurgitation directed centrally. - Left atrium: The atrium was severely dilated. - Right atrium: The atrium was mildly dilated. - Pulmonary arteries: Systolic pressure was mildly to moderately increased. PA peak pressure: 48 mm Hg (S).  Cardiac Cath 09/09/2011: AO 139/97 LV 139/29  Coronary angiography: Coronary dominance: right  Left mainstem: Widely patent with minor luminal irregularity.  Left anterior descending (LAD): Patent to the left ventricular apex. The first diagonal branch is patent. There is mild proximal LAD calcification and also mild luminal irregularity but there are no significant stenoses throughout the course of the LAD distribution.  Left circumflex (LCx): The left circumflex is a large-caliber vessel. The proximal and mid circumflex have mild diffuse irregularity. The first OM is patent but in the midportion of this vessel there is 50-75% stenosis. There is also a subbranch that has tight ostial stenosis but this is a very small vessel. The percent stenosis is estimated at 80-90. The continuation of the AV groove circumflex supplies a posterolateral branch which also has 80-90% stenosis. This is a very small vessel less than 1.5 mm in caliber.  Right coronary artery (RCA): The right coronary artery is dominant. There is mild luminal irregularity in the proximal vessel. The mid vessel has 40-50% stenosis. The distal vessel has luminal irregularity. The PDA and posterolateral branches are patent  Left ventriculography: The left ventricle is poorly visualized with ventriculography, despite a 30 cc injection. However, there is clearly global left ventricular dysfunction and I would estimate the ejection fraction in the range of 30%.  Final Conclusions:  1. Distal vessel coronary artery disease primarily in the left circumflex distribution as detailed  above 2. Nonobstructive LAD and right coronary artery stenoses 3. Severe global left ventricular dysfunction  Recommendations: Medical therapy. Plan will have to be made regarding anticoagulation in this patient with atrial fibrillation and cardiomyopathy, but this is complicated by concerns about medicine adherence and alcohol use.   ASSESSMENT AND PLAN: 1. CAD, native vessel: no  angina. Continue current Rx's.   2. Atrial fibrillation with RVR.  He is unwilling to take anticoagulant drugs because of past bleeding experiences on warfarin and DOAC medications. His rate is more controlled with the increase in beta blocker. I have left him on his current regimen. Echo with signficant LAE - making restoration of NSR unlikely. I would favor rate control. Last TSH was low - recheck today along with baseline labs.   3. Cardiomyopathy: multifactorial with AF, malignant HTN, and CAD. Medications as outlined above. Echo reviewed with him. Hydralazine increased today  4. HTN: controlled but will cut the Norvasc back and increase his hydralazine.  5. Swelling - he was finally able to tell me that he did want something for his swelling - just not fluid pills - will try lower dose of Norvasc. Increase Hydralazine to compensate for BP.    Current medicines are reviewed with the patient today.  The patient does not have concerns regarding medicines other than what has been noted above.  The following changes have been made:  See above.  Labs/ tests ordered today include:    Orders Placed This Encounter  Procedures  . Basic metabolic panel  . CBC  . TSH  . EKG 12-Lead     Disposition:   FU with Dr. Excell Seltzer in 3 months.   Patient is agreeable to this plan and will call if any problems develop in the interim.   Signed: Rosalio Macadamia, RN, ANP-C 08/23/2014 8:52 AM  [facility] [address] [place], [place]  [address] Phone: [phone] Fax:  [phone]

## 2014-08-30 ENCOUNTER — Other Ambulatory Visit: Payer: Self-pay

## 2014-08-30 DIAGNOSIS — I10 Essential (primary) hypertension: Secondary | ICD-10-CM

## 2014-08-30 DIAGNOSIS — I255 Ischemic cardiomyopathy: Secondary | ICD-10-CM

## 2014-08-30 DIAGNOSIS — I4891 Unspecified atrial fibrillation: Secondary | ICD-10-CM

## 2014-08-30 DIAGNOSIS — E785 Hyperlipidemia, unspecified: Secondary | ICD-10-CM

## 2014-08-30 MED ORDER — METOPROLOL TARTRATE 50 MG PO TABS: 50.0000 mg | ORAL_TABLET | Freq: Two times a day (BID) | ORAL | Status: DC

## 2014-08-30 MED ORDER — LISINOPRIL 40 MG PO TABS: 40.0000 mg | ORAL_TABLET | Freq: Every day | ORAL | Status: DC

## 2014-10-04 ENCOUNTER — Other Ambulatory Visit: Payer: Self-pay | Admitting: Cardiovascular Disease

## 2014-10-12 ENCOUNTER — Encounter: Payer: Self-pay | Admitting: Family Medicine

## 2014-10-12 ENCOUNTER — Ambulatory Visit (INDEPENDENT_AMBULATORY_CARE_PROVIDER_SITE_OTHER): Payer: Medicare Other | Admitting: Family Medicine

## 2014-10-12 ENCOUNTER — Ambulatory Visit (HOSPITAL_COMMUNITY)
Admission: RE | Admit: 2014-10-12 | Discharge: 2014-10-12 | Disposition: A | Discharge status: Home / Self Care | Payer: Medicare Other | Source: Ambulatory Visit | Attending: Family Medicine | Admitting: Family Medicine

## 2014-10-12 VITALS — BP 130/90 | HR 81 | Temp 98.0°F | Resp 16 | Ht 73.25 in | Wt 227.0 lb

## 2014-10-12 DIAGNOSIS — M2540 Effusion, unspecified joint: Secondary | ICD-10-CM

## 2014-10-12 DIAGNOSIS — M79604 Pain in right leg: Secondary | ICD-10-CM | POA: Diagnosis not present

## 2014-10-12 DIAGNOSIS — M1A49X1 Other secondary chronic gout, multiple sites, with tophus (tophi): Secondary | ICD-10-CM | POA: Diagnosis not present

## 2014-10-12 DIAGNOSIS — I2510 Atherosclerotic heart disease of native coronary artery without angina pectoris: Secondary | ICD-10-CM | POA: Diagnosis not present

## 2014-10-12 DIAGNOSIS — I739 Peripheral vascular disease, unspecified: Secondary | ICD-10-CM

## 2014-10-12 DIAGNOSIS — M25569 Pain in unspecified knee: Secondary | ICD-10-CM

## 2014-10-12 DIAGNOSIS — M7989 Other specified soft tissue disorders: Secondary | ICD-10-CM | POA: Insufficient documentation

## 2014-10-12 DIAGNOSIS — E1140 Type 2 diabetes mellitus with diabetic neuropathy, unspecified: Secondary | ICD-10-CM | POA: Diagnosis not present

## 2014-10-12 DIAGNOSIS — G629 Polyneuropathy, unspecified: Secondary | ICD-10-CM | POA: Diagnosis not present

## 2014-10-12 DIAGNOSIS — I5043 Acute on chronic combined systolic (congestive) and diastolic (congestive) heart failure: Secondary | ICD-10-CM | POA: Diagnosis not present

## 2014-10-12 DIAGNOSIS — R600 Localized edema: Secondary | ICD-10-CM

## 2014-10-12 LAB — POCT CBC
Granulocyte percent: 55.3 %G (ref 37–80)
HCT, POC: 42.4 % — AB (ref 43.5–53.7)
Hemoglobin: 13.5 g/dL — AB (ref 14.1–18.1)
LYMPH, POC: 2.8 (ref 0.6–3.4)
MCH, POC: 28.2 pg (ref 27–31.2)
MCHC: 31.9 g/dL (ref 31.8–35.4)
MCV: 88.3 fL (ref 80–97)
MID (CBC): 0.6 (ref 0–0.9)
MPV: 6.5 fL (ref 0–99.8)
POC Granulocyte: 4.1 (ref 2–6.9)
POC LYMPH PERCENT: 36.9 %L (ref 10–50)
POC MID %: 7.8 % (ref 0–12)
Platelet Count, POC: 281 10*3/uL (ref 142–424)
RBC: 4.8 M/uL (ref 4.69–6.13)
RDW, POC: 16.5 %
WBC: 7.5 10*3/uL (ref 4.6–10.2)

## 2014-10-12 LAB — POCT GLYCOSYLATED HEMOGLOBIN (HGB A1C): Hemoglobin A1C: 6.1

## 2014-10-12 LAB — URIC ACID: URIC ACID, SERUM: 7.5 mg/dL (ref 4.0–7.8)

## 2014-10-12 LAB — POCT SEDIMENTATION RATE

## 2014-10-12 LAB — GLUCOSE, POCT (MANUAL RESULT ENTRY): POC GLUCOSE: 106 mg/dL — AB (ref 70–99)

## 2014-10-12 MED ORDER — HYDROCODONE-ACETAMINOPHEN 5-325 MG PO TABS: 1.0000 | ORAL_TABLET | Freq: Four times a day (QID) | ORAL | Status: DC | PRN

## 2014-10-12 MED ORDER — INDOMETHACIN 25 MG PO CAPS: 25.0000 mg | ORAL_CAPSULE | Freq: Three times a day (TID) | ORAL | Status: DC

## 2014-10-12 MED ORDER — COLCHICINE 0.6 MG PO TABS: ORAL_TABLET | ORAL | Status: DC

## 2014-10-12 NOTE — Progress Notes (Signed)
VASCULAR LAB PRELIMINARY  PRELIMINARY  PRELIMINARY  PRELIMINARY  Right lower extremity venous duplex completed.    Preliminary report:  There is no DVT or SVT noted in the right lower extremity. There are several enlarged lymph nodes noted in the right groin.   Zerah Hilyer, RVT 10/12/2014, 3:08 PM

## 2014-10-12 NOTE — Patient Instructions (Addendum)
GO OVER TO [place] [facility] TO REGISTER FOR DOPPLER.  YOUR APPOINTMENT IS AT 3PM TODAY Take 2 tabs of your colcrys today and 1 tab an hour later.  Repeat daily until diarrhea develops or gout flair resolves. Continue to take the indomethacin 3x/d with food until resolves. Start below diet changes. Can use prn hydrocodone. You will need to recheck in clinic for any refills.  If pain and swelling continues, I would recommend returning to clinic for aspiration of your knee effusion to confirm gout vs osteoarthritis and ensure no blood or infection in joint - this is an easy procedure in the office which patients tolerate well and often result in immediate relief.  Do not restart your allopurinol when you are having a gout flair - it will make it worse.  However, after your pain and swelling decrease, I will then send a refill on the allopurinol to your pharmacy which you should restart after symptoms improve.  Elevate your legs above the level of your heart whenever possible.    Make sure you follow-up with your doctor to your your diabetes and neuropathy checked on - you are at high risk for developing an ulcer of your feet which can lead to infection and amputation so it is good to keep a very close eye on your feet with your diabetic doctor and your foot doctor if needed.  You can reduce your uric acid level by avoiding red meat, saturated fats (dairy fats, butter fats, animal fats), high fructose corn syrup, and beer and drinking more water.   Peripheral Edema You have swelling in your legs (peripheral edema). This swelling is due to excess accumulation of salt and water in your body. Edema may be a sign of heart, kidney or liver disease, or a side effect of a medication. It may also be due to problems in the leg veins. Elevating your legs and using special support stockings may be very helpful, if the cause of the swelling is due to poor venous circulation. Avoid long periods of standing,  whatever the cause. Treatment of edema depends on identifying the cause. Chips, pretzels, pickles and other salty foods should be avoided. Restricting salt in your diet is almost always needed. Water pills (diuretics) are often used to remove the excess salt and water from your body via urine. These medicines prevent the kidney from reabsorbing sodium. This increases urine flow. Diuretic treatment may also result in lowering of potassium levels in your body. Potassium supplements may be needed if you have to use diuretics daily. Daily weights can help you keep track of your progress in clearing your edema. You should call your caregiver for follow up care as recommended. SEEK IMMEDIATE MEDICAL CARE IF:   You have increased swelling, pain, redness, or heat in your legs.  You develop shortness of breath, especially when lying down.  You develop chest or abdominal pain, weakness, or fainting.  You have a fever. Document Released: 03/06/2004 Document Revised: 04/21/2011 Document Reviewed: 02/14/2009 [facility] Patient Information 2015 [place], [place]. This information is not intended to replace advice given to you by your health care provider. Make sure you discuss any questions you have with your health care provider.  Gout Gout is an inflammatory arthritis caused by a buildup of uric acid crystals in the joints. Uric acid is a chemical that is normally present in the blood. When the level of uric acid in the blood is too high it can form crystals that deposit in your  joints and tissues. This causes joint redness, soreness, and swelling (inflammation). Repeat attacks are common. Over time, uric acid crystals can form into masses (tophi) near a joint, destroying bone and causing disfigurement. Gout is treatable and often preventable. CAUSES  The disease begins with elevated levels of uric acid in the blood. Uric acid is produced by your body when it breaks down a naturally found substance called purines.  Certain foods you eat, such as meats and fish, contain high amounts of purines. Causes of an elevated uric acid level include:  Being passed down from parent to child (heredity).  Diseases that cause increased uric acid production (such as obesity, psoriasis, and certain cancers).  Excessive alcohol use.  Diet, especially diets rich in meat and seafood.  Medicines, including certain cancer-fighting medicines (chemotherapy), water pills (diuretics), and aspirin.  Chronic kidney disease. The kidneys are no longer able to remove uric acid well.  Problems with metabolism. Conditions strongly associated with gout include:  Obesity.  High blood pressure.  High cholesterol.  Diabetes. Not everyone with elevated uric acid levels gets gout. It is not understood why some people get gout and others do not. Surgery, joint injury, and eating too much of certain foods are some of the factors that can lead to gout attacks. SYMPTOMS   An attack of gout comes on quickly. It causes intense pain with redness, swelling, and warmth in a joint.  Fever can occur.  Often, only one joint is involved. Certain joints are more commonly involved:  Base of the big toe.  Knee.  Ankle.  Wrist.  Finger. Without treatment, an attack usually goes away in a few days to weeks. Between attacks, you usually will not have symptoms, which is different from many other forms of arthritis. DIAGNOSIS  Your caregiver will suspect gout based on your symptoms and exam. In some cases, tests may be recommended. The tests may include:  Blood tests.  Urine tests.  X-rays.  Joint fluid exam. This exam requires a needle to remove fluid from the joint (arthrocentesis). Using a microscope, gout is confirmed when uric acid crystals are seen in the joint fluid. TREATMENT  There are two phases to gout treatment: treating the sudden onset (acute) attack and preventing attacks (prophylaxis).  Treatment of an Acute  Attack.  Medicines are used. These include anti-inflammatory medicines or steroid medicines.  An injection of steroid medicine into the affected joint is sometimes necessary.  The painful joint is rested. Movement can worsen the arthritis.  You may use warm or cold treatments on painful joints, depending which works best for you.  Treatment to Prevent Attacks.  If you suffer from frequent gout attacks, your caregiver may advise preventive medicine. These medicines are started after the acute attack subsides. These medicines either help your kidneys eliminate uric acid from your body or decrease your uric acid production. You may need to stay on these medicines for a very long time.  The early phase of treatment with preventive medicine can be associated with an increase in acute gout attacks. For this reason, during the first few months of treatment, your caregiver may also advise you to take medicines usually used for acute gout treatment. Be sure you understand your caregiver's directions. Your caregiver may make several adjustments to your medicine dose before these medicines are effective.  Discuss dietary treatment with your caregiver or dietitian. Alcohol and drinks high in sugar and fructose and foods such as meat, poultry, and seafood can increase uric acid  levels. Your caregiver or dietitian can advise you on drinks and foods that should be limited. HOME CARE INSTRUCTIONS   Do not take aspirin to relieve pain. This raises uric acid levels.  Only take over-the-counter or prescription medicines for pain, discomfort, or fever as directed by your caregiver.  Rest the joint as much as possible. When in bed, keep sheets and blankets off painful areas.  Keep the affected joint raised (elevated).  Apply warm or cold treatments to painful joints. Use of warm or cold treatments depends on which works best for you.  Use crutches if the painful joint is in your leg.  Drink enough fluids  to keep your urine clear or pale yellow. This helps your body get rid of uric acid. Limit alcohol, sugary drinks, and fructose drinks.  Follow your dietary instructions. Pay careful attention to the amount of protein you eat. Your daily diet should emphasize fruits, vegetables, whole grains, and fat-free or low-fat milk products. Discuss the use of coffee, vitamin C, and cherries with your caregiver or dietitian. These may be helpful in lowering uric acid levels.  Maintain a healthy body weight. SEEK MEDICAL CARE IF:   You develop diarrhea, vomiting, or any side effects from medicines.  You do not feel better in 24 hours, or you are getting worse. SEEK IMMEDIATE MEDICAL CARE IF:   Your joint becomes suddenly more tender, and you have chills or a fever. MAKE SURE YOU:   Understand these instructions.  Will watch your condition.  Will get help right away if you are not doing well or get worse. Document Released: 01/25/2000 Document Revised: 06/13/2013 Document Reviewed: 09/10/2011 [facility] Patient Information 2015 [place], [place]. This information is not intended to replace advice given to you by your health care provider. Make sure you discuss any questions you have with your health care provider.    There are some easy diet adjustments that you can make to lower your blood uric acid level and thereby hopefully never have to suffer from another gout flair (or at least less frequently).  You should avoid alcohol, drink plenty of water, and try to follow a "low purine" diet as your body produces uric acid when it breaks down prurines-substances that are found naturally in your body, as well as in certain foods such as organ meats, anchioves, herring, asparagus, and mushrooms. Also, increasing your diet in certain foods that may lower uric acid levels is a pretty safe way to decrease your likelihood of gout so consider drinking coffee (regular and/or decaf), eating fruits with Vitamin C in them  such as citrus fruits, strawberries, broccoli,  brussel sprouts, papaya, and cantaloupe (though megadoses of vitamin C supplements may do the opposite and increase your body's uric acid levels), and/or eating more cherries and other dark-colored fruits, such as blackberries, blueberries, purple grapes and raspberries. In addition, getting plenty of vitamin A though yellow fruits, or dark green/yellow vegetables at least every other day is good.  Other general diet guidelines for people with gout who need to lower their blood uric acid levels are as follows:  Drink 8 to 16 cups ( about 2 to 4 liters) of fluid each day, with at least half being water. Eat a moderate amount of protein, preferably from healthy sources, such as low-fat or fat-free dairy, tofu, eggs, and nut butters. Limit your daily intake of meat, fish, and poultry to 4 to 6 ounces. Avoid high fat meats and desserts. Decrease your intake of shellfish, beef, lamb,  pork, eggs and cheese. Avoid drastic weight reduction or fasting.  If weight loss is desired lose it over a period of several months.
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
## 2014-10-14 LAB — PRO B NATRIURETIC PEPTIDE: PRO B NATRI PEPTIDE: 1834 pg/mL — AB (ref <126)

## 2014-10-16 MED ORDER — POTASSIUM CHLORIDE CRYS ER 20 MEQ PO TBCR: 20.0000 meq | EXTENDED_RELEASE_TABLET | Freq: Every day | ORAL | Status: AC

## 2014-10-16 MED ORDER — FUROSEMIDE 40 MG PO TABS: 40.0000 mg | ORAL_TABLET | Freq: Every day | ORAL | Status: DC

## 2014-10-16 MED ORDER — ALLOPURINOL 100 MG PO TABS: 100.0000 mg | ORAL_TABLET | Freq: Every day | ORAL | Status: DC

## 2014-10-16 MED ORDER — PREDNISONE 10 MG PO TABS: ORAL_TABLET | ORAL | Status: DC

## 2014-10-16 MED ORDER — NITROGLYCERIN 0.4 MG SL SUBL: 0.4000 mg | SUBLINGUAL_TABLET | SUBLINGUAL | Status: AC | PRN

## 2014-10-17 ENCOUNTER — Encounter: Payer: Self-pay | Admitting: Family Medicine

## 2014-10-20 ENCOUNTER — Telehealth: Payer: Self-pay | Admitting: *Deleted

## 2014-10-20 ENCOUNTER — Encounter: Payer: Self-pay | Admitting: *Deleted

## 2014-10-20 NOTE — Telephone Encounter (Signed)
Mailed letter to move pt appointment.

## 2014-11-23 ENCOUNTER — Encounter: Payer: Self-pay | Admitting: Nurse Practitioner

## 2014-11-23 ENCOUNTER — Ambulatory Visit (INDEPENDENT_AMBULATORY_CARE_PROVIDER_SITE_OTHER): Payer: Medicare Other | Admitting: Nurse Practitioner

## 2014-11-23 VITALS — BP 120/80 | HR 79 | Ht 75.0 in | Wt 233.0 lb

## 2014-11-23 DIAGNOSIS — I255 Ischemic cardiomyopathy: Secondary | ICD-10-CM | POA: Diagnosis not present

## 2014-11-23 DIAGNOSIS — I10 Essential (primary) hypertension: Secondary | ICD-10-CM

## 2014-11-23 DIAGNOSIS — I480 Paroxysmal atrial fibrillation: Secondary | ICD-10-CM | POA: Diagnosis not present

## 2014-11-23 DIAGNOSIS — E785 Hyperlipidemia, unspecified: Secondary | ICD-10-CM | POA: Diagnosis not present

## 2014-11-23 LAB — CBC
HCT: 42.7 % (ref 39.0–52.0)
Hemoglobin: 14 g/dL (ref 13.0–17.0)
MCH: 29.1 pg (ref 26.0–34.0)
MCHC: 32.8 g/dL (ref 30.0–36.0)
MCV: 88.8 fL (ref 78.0–100.0)
MPV: 9.5 fL (ref 8.6–12.4)
Platelets: 178 10*3/uL (ref 150–400)
RBC: 4.81 MIL/uL (ref 4.22–5.81)
RDW: 16.1 % — ABNORMAL HIGH (ref 11.5–15.5)
WBC: 6.9 10*3/uL (ref 4.0–10.5)

## 2014-11-23 LAB — BASIC METABOLIC PANEL
BUN: 19 mg/dL (ref 7–25)
CO2: 25 mmol/L (ref 20–31)
Calcium: 9.3 mg/dL (ref 8.6–10.3)
Chloride: 107 mmol/L (ref 98–110)
Creat: 1.21 mg/dL (ref 0.70–1.33)
Glucose, Bld: 109 mg/dL — ABNORMAL HIGH (ref 65–99)
Potassium: 4.2 mmol/L (ref 3.5–5.3)
Sodium: 140 mmol/L (ref 135–146)

## 2014-11-23 MED ORDER — DILTIAZEM HCL ER COATED BEADS 240 MG PO CP24: 240.0000 mg | ORAL_CAPSULE | Freq: Every day | ORAL | Status: DC

## 2014-11-23 NOTE — Progress Notes (Addendum)
CARDIOLOGY OFFICE NOTE  Date:  11/23/2014    Timothy Cassisnthony L Sheer Date of Birth: November 26, 1955 Medical Record #[id number]  PCP:  Gwynneth AlimentSANDERS,ROBYN N, MD  Cardiologist:  Excell Seltzerooper    Chief Complaint  Patient presents with  . Atrial Fibrillation    3 month check - seen for Dr. Excell Seltzerooper  . Edema    History of Present Illness: Timothy Olsen is a 59 y.o. male who presents today for a follow up visit. Seen for Dr. Excell Seltzerooper. This is a 3 month check. He is followed for cardiomyopathy, hypertension, and diffuse small vessel coronary artery disease. He is limited by peripheral neuropathy related to long-standing diabetes. He had atrial fibrillation back in 2013 but was unable to tolerate any anticoagulant medications. He was tried on Coumadin and Xarelto. Says he 'almost bled out' on both drugs. Complained of severe epistaxis and has been unwilling to try any other anticoagulant drugs since.  Seen back earlier this summer - reported that his heart rate was higher - in the 80 to 90's. His baseline heart rate is generally about 60 bpm. No palpitations. No chest pain and was not short of breath. He is largely inactive because of peripheral neuropathy and foot problems. He complained of chronic ankle swelling.   I last saw him in July - pretty argumentative with his history to me.  Not aware of his atrial fib. Continued to refuse anticoagulation. Said he was feeling ok. Said he restricts his salt but he eats out but brought in a bag from [employer].    Comes back today. Here alone. History remains a little hard to follow. Seems to be doing ok. No awareness of his AF. Refused anticoagulation. Weight is up but he says he is not short of breath and swelling seems better. He is happy that he has gained weight. BP ok. Notes HR typically around 90 at home but higher on my exam. Says he is taking his medicines.   Past Medical History  Diagnosis Date  . Hypertension   . Diabetes mellitus   . CAD (coronary artery  disease)     LHC 7/13: mOM1 50-75%, ostial subbranch 80-90% (very small), PL branch 80-90% (very small), mRCA 40-50%, EF 30% => med Rx for small vessel disease  . Atrial fibrillation (HCC)     CHADS2=3; Xarelto started 7/13; s/p TEE-DCCV => NSR;  patient back in AF with RVR at f/u 8/13 => amiodarone started => back in NSR 9/13 f/u OV  . Ischemic cardiomyopathy     echo 7/13: mild LVH, EF 25-30%, global HK with severe Hk to AK of basal to mid inf and post walls, severe diast dysfxn, mild MR, mod LAE, mod reduced RV fxn, mod RAE, PASP 50 (mild to mod pulmo HTN);  TEE 7/13 with EF 30-35%  . Epistaxis 08/2011    On heparin and Coumadin s/p silver nitrate cauterization L nare  . Hyperlipidemia   . Chronic combined systolic and diastolic CHF (congestive heart failure) (HCC)   . Gout   . Tobacco abuse   . Alcohol abuse   . MI (myocardial infarction) [facility](HCC)     Past Surgical History  Procedure Laterality Date  . Tonsillectomy    . Left thumb surgery    . Adenoidectomy    . Tee without cardioversion  09/15/2011    Procedure: TRANSESOPHAGEAL ECHOCARDIOGRAM (TEE);  Surgeon: Lewayne BuntingBrian S Crenshaw, MD;  [facility];  Service: Cardiovascular;  Laterality: N/A;  . Cardioversion  09/15/2011  Procedure: CARDIOVERSION;  Surgeon: Lewayne Bunting, MD;  Location: [facility] ENDOSCOPY;  Service: Cardiovascular;  Laterality: N/A;  . Cardiac catheterization  09/09/2011    a) widely patent left main with minor luminal irregularities, patent LAD, mild prox LAD calcification and luminal irregularities, patent D1, diffuse prox and LCX- mid irregularity, 50-75% mid OM1 stenosis, 80-90% ostial stenosis in small subbranch, small PLB 80-90% stenosis, RCA- mild proximal luminal irregularity, mid 40-50% stenosis, distal irregularity, PDA and PLB patent; LVEF 30% and global L  . Left heart catheterization with coronary angiogram N/A 09/09/2011    Procedure: LEFT HEART CATHETERIZATION WITH CORONARY ANGIOGRAM;  Surgeon: Tonny Bollman, MD;  Location: [facility] CATH LAB;  Service: Cardiovascular;  Laterality: N/A;     Medications: Current Outpatient Prescriptions  Medication Sig Dispense Refill  . allopurinol (ZYLOPRIM) 100 MG tablet Take 1 tablet (100 mg total) by mouth daily. 30 tablet 2  . aspirin EC 81 MG tablet Take 81 mg by mouth 2 (two) times a week.  1 tablet 0  . colchicine 0.6 MG tablet Take 2 tabs now at first sign of gout onset then 1 tab an hour later. 30 tablet 0  . furosemide (LASIX) 40 MG tablet Take 1 tablet (40 mg total) by mouth daily. 30 tablet 0  . glipiZIDE (GLUCOTROL) 5 MG tablet Take 5 mg by mouth 2 (two) times daily before a meal.      . hydrALAZINE (APRESOLINE) 50 MG tablet Take 1 tablet (50 mg total) by mouth 3 (three) times daily. 90 tablet 6  . HYDROcodone-acetaminophen (NORCO/VICODIN) 5-325 MG per tablet Take 1 tablet by mouth every 6 (six) hours as needed for moderate pain. 30 tablet 0  . indomethacin (INDOCIN) 25 MG capsule Take 1 capsule (25 mg total) by mouth 3 (three) times daily with meals. 90 capsule 0  . lisinopril (PRINIVIL,ZESTRIL) 40 MG tablet Take 1 tablet (40 mg total) by mouth daily. 30 tablet 11  . metFORMIN (GLUCOPHAGE) 1000 MG tablet Take 1,000 mg by mouth as directed. Sliding Scale    . metoprolol (LOPRESSOR) 50 MG tablet Take 1 tablet (50 mg total) by mouth 2 (two) times daily. TAKE ONE TABLET BY MOUTH TWICE DAILY 60 tablet 11  . nitroGLYCERIN (NITROSTAT) 0.4 MG SL tablet Place 1 tablet (0.4 mg total) under the tongue every 5 (five) minutes as needed. For chest pain 25 tablet 0  . potassium chloride SA (K-DUR,KLOR-CON) 20 MEQ tablet Take 1 tablet (20 mEq total) by mouth daily. With lasix only 30 tablet 0  . pravastatin (PRAVACHOL) 20 MG tablet TAKE ONE TABLET BY MOUTH ONCE DAILY 30 tablet 11  . diltiazem (CARDIZEM CD) 240 MG 24 hr capsule Take 1 capsule (240 mg total) by mouth daily. 90 capsule 3   No current facility-administered medications for this visit.     Allergies: No Known Allergies  Social History: The patient  reports that he has quit smoking. His smoking use included Cigarettes. He has a 1 pack-year smoking history. He has never used smokeless tobacco. He reports that he drinks about 3.0 oz of alcohol per week. He reports that he does not use illicit drugs.   Family History: The patient's family history includes Diabetes type II in his mother; Hypertension in his father.   Review of Systems: Please see the history of present illness.   Otherwise, the review of systems is positive for none.   All other systems are reviewed and negative.   Physical Exam: VS:  BP 120/80 mmHg  Pulse 79  Ht  (1.905 m)  Wt 233 lb (105.688 kg)  BMI 29.12 kg/m2  SpO2 95% .  BMI Body mass index is 29.12 kg/(m^2).  Wt Readings from Last 3 Encounters:  11/23/14 233 lb (105.688 kg)  10/12/14 227 lb (102.967 kg)  08/23/14 222 lb 3.2 oz (100.789 kg)    General: Alert. He is in no acute distress. His weight is up.  HEENT: Normal. Neck: Supple, no JVD, carotid bruits, or masses noted.  Cardiac: Irregular irregular rhythm with elevated rate.  HR is 120 by me. No murmurs, rubs, or gallops. No edema.  Respiratory:  Lungs are clear to auscultation bilaterally with normal work of breathing.  GI: Soft and nontender.  MS: No deformity or atrophy. Gait and ROM intact. Skin: Warm and dry. Color is normal.  Neuro:  Strength and sensation are intact and no gross focal deficits noted.  Psych: Alert, appropriate and with normal affect.   LABORATORY DATA:  EKG:  EKG is not ordered today.   Lab Results  Component Value Date   WBC 7.5 10/12/2014   HGB 13.5* 10/12/2014   HCT 42.4* 10/12/2014   PLT 260.0 08/23/2014   GLUCOSE 84 08/23/2014   CHOL 155 09/09/2011   TRIG 76 09/09/2011   HDL 41 09/09/2011   LDLCALC 99 09/09/2011   ALT <8 12/15/2013   AST 14 12/15/2013   NA 143 08/23/2014   K 4.2 08/23/2014   CL 106 08/23/2014   CREATININE 1.05  08/23/2014   BUN 13 08/23/2014   CO2 30 08/23/2014   TSH 1.46 08/23/2014   INR 2.20* 09/19/2011   HGBA1C 6.1 10/12/2014    BNP (last 3 results) No results for input(s): BNP in the last 8760 hours.  ProBNP (last 3 results)  Recent Labs  10/12/14 1207  PROBNP 1834.00*     Other Studies Reviewed Today:  Echo Study Conclusions from 08/17/2014  - Left ventricle: The cavity size was mildly dilated. Wall thickness was normal. Systolic function was mildly reduced. The estimated ejection fraction was in the range of 45% to 50%. Hypokinesis of the basal-midinferolateral myocardium. - Mitral valve: There was mild to moderate regurgitation directed centrally. - Left atrium: The atrium was severely dilated. - Right atrium: The atrium was mildly dilated. - Pulmonary arteries: Systolic pressure was mildly to moderately increased. PA peak pressure: 48 mm Hg (S).  Cardiac Cath 09/09/2011:  Coronary angiography:  Left mainstem: Widely patent with minor luminal irregularity.  Left anterior descending (LAD): Patent to the left ventricular apex. The first diagonal branch is patent. There is mild proximal LAD calcification and also mild luminal irregularity but there are no significant stenoses throughout the course of the LAD distribution.  Left circumflex (LCx): The left circumflex is a large-caliber vessel. The proximal and mid circumflex have mild diffuse irregularity. The first OM is patent but in the midportion of this vessel there is 50-75% stenosis. There is also a subbranch that has tight ostial stenosis but this is a very small vessel. The percent stenosis is estimated at 80-90. The continuation of the AV groove circumflex supplies a posterolateral branch which also has 80-90% stenosis. This is a very small vessel less than 1.5 mm in caliber.  Right coronary artery (RCA): The right coronary artery is dominant. There is mild luminal irregularity in the proximal  vessel. The mid vessel has 40-50% stenosis. The distal vessel has luminal irregularity. The PDA and posterolateral branches are patent  Left ventriculography: The left ventricle  is poorly visualized with ventriculography, despite a 30 cc injection. However, there is clearly global left ventricular dysfunction and I would estimate the ejection fraction in the range of 30%.  Final Conclusions:  1. 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 Nonobstructive LAD and right coronary artery stenoses 3. Severe global left ventricular dysfunction  Recommendations: Medical therapy. Plan will have to be made regarding anticoagulation in this patient with atrial fibrillation and cardiomyopathy, but this is complicated by concerns about medicine adherence and alcohol use.   ASSESSMENT AND PLAN: 1. CAD, native vessel: no angina. Continue current Rx's.   2. Atrial fibrillation with RVR. He is unwilling to take anticoagulant drugs because of past bleeding experiences on warfarin and DOAC medications. His rate has previously been more controlled with the increase in beta blocker. I have stopped his Norvasc - will replace with Diltiazem 240 mg a day.  Echo with signficant LAE - making restoration of NSR unlikely. I would favor rate control.   3. Cardiomyopathy: multifactorial with AF, malignant HTN, and CAD. Medications as outlined above. He is on a pretty good regimen. Changing Norvasc to Diltiazem today.   4. HTN: controlled on present treatment.   5. Swelling - seems better. Weight is up - will leave on current regimen. Recheck lab to include BNP today. I suspect this is more to increased calories. He was happy that he had gained weight.   Current medicines are reviewed with the patient today.  The patient does not have concerns regarding medicines other than what has been noted above.  The following changes have been made:  See  above.  Labs/ tests ordered today include:    Orders Placed This Encounter  Procedures  . Brain natriuretic peptide  . Basic metabolic panel  . CBC     Disposition:   FU with me in 6 weeks with EKG.   Patient is agreeable to this plan and will call if any problems develop in the interim.   Signed: Rosalio Macadamia, RN, ANP-C 11/23/2014 10:58 AM  [facility] [address] [place], [place]  [address] Phone: [phone] Fax: [phone]       Addendum from Dr. Excell Seltzer regarding Watchman  Tonny Bollman, MD  Marily Lente, NP; Rosalio Macadamia, NP           Lawson Fiscal sees him 12-6. He can be a major challenge and I don't think he would consent to 6 weeks of Coumadin but would be worth discussing with him. My gut feeling is that he will be a NO.

## 2014-11-23 NOTE — Patient Instructions (Addendum)
We will be checking the following labs today - BMET, CBC, BNP  Medication Instructions:    Continue with your current medicines.   STOP Norvasc (amlodipine)  START Diltiazem 240 mg to take one a day - start today - this has been sent to your drug store.    Testing/Procedures To Be Arranged:  N/A  Follow-Up:   See me in 6 weeks with EKG   Other Special Instructions:   Keep a check on your heart rate at home.   Call the [facility] office at [phone] if you have any questions, problems or concerns.

## 2014-11-24 ENCOUNTER — Ambulatory Visit: Payer: Medicare Other | Admitting: Nurse Practitioner

## 2014-11-24 DIAGNOSIS — H2513 Age-related nuclear cataract, bilateral: Secondary | ICD-10-CM | POA: Diagnosis not present

## 2014-11-24 DIAGNOSIS — H52223 Regular astigmatism, bilateral: Secondary | ICD-10-CM | POA: Diagnosis not present

## 2014-11-24 DIAGNOSIS — H524 Presbyopia: Secondary | ICD-10-CM | POA: Diagnosis not present

## 2014-11-24 DIAGNOSIS — E119 Type 2 diabetes mellitus without complications: Secondary | ICD-10-CM | POA: Diagnosis not present

## 2014-11-24 DIAGNOSIS — H11002 Unspecified pterygium of left eye: Secondary | ICD-10-CM | POA: Diagnosis not present

## 2014-11-24 DIAGNOSIS — H5203 Hypermetropia, bilateral: Secondary | ICD-10-CM | POA: Diagnosis not present

## 2014-11-24 LAB — BRAIN NATRIURETIC PEPTIDE: Brain Natriuretic Peptide: 321.5 pg/mL — ABNORMAL HIGH (ref 0.0–100.0)

## 2014-11-27 ENCOUNTER — Other Ambulatory Visit: Payer: Self-pay | Admitting: *Deleted

## 2014-11-27 MED ORDER — FUROSEMIDE 40 MG PO TABS: 40.0000 mg | ORAL_TABLET | Freq: Every day | ORAL | Status: DC

## 2014-12-04 DIAGNOSIS — I119 Hypertensive heart disease without heart failure: Secondary | ICD-10-CM | POA: Diagnosis not present

## 2014-12-04 DIAGNOSIS — R7309 Other abnormal glucose: Secondary | ICD-10-CM | POA: Diagnosis not present

## 2014-12-13 ENCOUNTER — Ambulatory Visit: Payer: Medicare Other | Admitting: Nurse Practitioner

## 2015-01-12 ENCOUNTER — Ambulatory Visit (INDEPENDENT_AMBULATORY_CARE_PROVIDER_SITE_OTHER): Payer: Medicare Other | Admitting: Physician Assistant

## 2015-01-12 ENCOUNTER — Encounter: Payer: Self-pay | Admitting: Physician Assistant

## 2015-01-12 VITALS — BP 147/82 | HR 69 | Temp 97.3°F | Resp 16 | Ht 73.25 in | Wt 230.0 lb

## 2015-01-12 DIAGNOSIS — R8290 Unspecified abnormal findings in urine: Secondary | ICD-10-CM | POA: Diagnosis not present

## 2015-01-12 DIAGNOSIS — M545 Low back pain, unspecified: Secondary | ICD-10-CM

## 2015-01-12 LAB — POC MICROSCOPIC URINALYSIS (UMFC): Mucus: ABSENT

## 2015-01-12 LAB — POCT URINALYSIS DIP (MANUAL ENTRY)
Blood, UA: NEGATIVE
Glucose, UA: 100 — AB
Leukocytes, UA: NEGATIVE
Nitrite, UA: POSITIVE — AB
Protein Ur, POC: 100 — AB
SPEC GRAV UA: 1.01
UROBILINOGEN UA: 2
pH, UA: 5.5

## 2015-01-12 MED ORDER — SULFAMETHOXAZOLE-TRIMETHOPRIM 800-160 MG PO TABS: 1.0000 | ORAL_TABLET | Freq: Two times a day (BID) | ORAL | Status: DC

## 2015-01-12 NOTE — Patient Instructions (Signed)
There are some abnormal findings of the urine, but it's not clear that the cause is an infection. The infection may be in your prostate. I have sent an antibiotic to the pharmacy for you to start while we wait for the urine culture results, which will take 2-7 days.  I also note that there is sugar in your urine, which suggests that your diabetes is not as well controlled as it should be. Dr. Allyne GeeSanders can address this at your visit there later this month.

## 2015-01-12 NOTE — Progress Notes (Signed)
Patient ID: Timothy Olsen, male    DOB: 08/31/1955, 59 y.o.   MRN: [id number]  PCP: Gwynneth AlimentSANDERS,ROBYN N, MD  Subjective:   Chief Complaint  Patient presents with  . Flank Pain    "few days"    HPI Presents for evaluation of what he believes is a UTI.  Bilateral low back pain x a few days. Initial symptoms came on while he was sitting down. It got worse suddenly while he was getting out of the car at the [employer]. Feels like it did a year ago when he was treated for UTI. He's also had similar symptoms with walking pneumonia a few years ago, so he was sure to come in quickly. Took some cranberry pills, ASA 81 mg and AZO which all helped reduce his symptoms temporarily.  Notes that he drinks a lot of juice and Ginger Ale, and sometimes that triggers it.  "My back don't hurt."  No cough. No fever, chills. No nausea, vomiting or diarrhea, though he notes that he had some mild loose stools after over eating at [place]. No melena or hematochezia. No urinary urgency or increase in urinary frequency (he takes furosemide). No hematuria.  To see his PCP on 01/28/15.    Review of Systems  Constitutional: Negative for fever, chills, diaphoresis, activity change, appetite change, fatigue and unexpected weight change.  Eyes: Negative for visual disturbance.  Respiratory: Negative for cough and shortness of breath.   Cardiovascular: Negative for chest pain, palpitations and leg swelling.  Gastrointestinal: Negative for nausea, vomiting, diarrhea, constipation and blood in stool.  Genitourinary: Positive for frequency (but not more than usual) and flank pain (points to the low back bilaterally). Negative for dysuria, urgency, hematuria and difficulty urinating.  Musculoskeletal: Positive for back pain. Negative for arthralgias and gait problem.  Neurological: Negative for dizziness, weakness and light-headedness.  Hematological: Negative for adenopathy.       Patient Active  Problem List   Diagnosis Date Noted  . PVD (peripheral vascular disease) (HCC) 02/16/2012  . Peripheral neuropathy (HCC) 02/16/2012  . CAD in native artery 11/26/2011  . Edema of lower extremity 11/26/2011  . Epistaxis 09/16/2011  . Acute on chronic combined systolic and diastolic CHF (congestive heart failure) (HCC) 09/16/2011  . Atrial fibrillation with rapid ventricular response (HCC) 09/12/2011  . Non-ST elevation myocardial infarction (NSTEMI), initial episode of care (HCC) 09/12/2011  . Ischemic cardiomyopathy 09/12/2011  . Joint swelling 01/23/2011  . Chest pain 01/21/2011  . Diabetes mellitus (HCC) 01/21/2011  . Hypertension 01/21/2011  . Hyperlipidemia 01/21/2011     Prior to Admission medications   Medication Sig Start Date End Date Taking? Authorizing Provider  allopurinol (ZYLOPRIM) 100 MG tablet Take 1 tablet (100 mg total) by mouth daily. 10/16/14  Yes Sherren MochaEva N Shaw, MD  aspirin EC 81 MG tablet Take 81 mg by mouth 2 (two) times a week.  07/06/12  Yes Tonny BollmanMichael Cooper, MD  colchicine 0.6 MG tablet Take 2 tabs now at first sign of gout onset then 1 tab an hour later. 10/12/14  Yes Sherren MochaEva N Shaw, MD  diltiazem (CARDIZEM CD) 240 MG 24 hr capsule Take 1 capsule (240 mg total) by mouth daily. 11/23/14  Yes Rosalio MacadamiaLori C Gerhardt, NP  furosemide (LASIX) 40 MG tablet Take 1 tablet (40 mg total) by mouth daily. 11/27/14  Yes Rosalio MacadamiaLori C Gerhardt, NP  glipiZIDE (GLUCOTROL) 5 MG tablet Take 5 mg by mouth 2 (two) times daily before a meal.     Yes  Historical Provider, MD  hydrALAZINE (APRESOLINE) 50 MG tablet Take 1 tablet (50 mg total) by mouth 3 (three) times daily. 08/23/14  Yes Rosalio Macadamia, NP  HYDROcodone-acetaminophen (NORCO/VICODIN) 5-325 MG per tablet Take 1 tablet by mouth every 6 (six) hours as needed for moderate pain. 10/12/14  Yes Sherren Mocha, MD  indomethacin (INDOCIN) 25 MG capsule Take 1 capsule (25 mg total) by mouth 3 (three) times daily with meals. 10/12/14  Yes Sherren Mocha, MD  lisinopril  (PRINIVIL,ZESTRIL) 40 MG tablet Take 1 tablet (40 mg total) by mouth daily. 08/30/14  Yes Tonny Bollman, MD  metFORMIN (GLUCOPHAGE) 1000 MG tablet Take 1,000 mg by mouth as directed. Sliding Scale 06/06/14  Yes Historical Provider, MD  metoprolol (LOPRESSOR) 50 MG tablet Take 1 tablet (50 mg total) by mouth 2 (two) times daily. TAKE ONE TABLET BY MOUTH TWICE DAILY 08/30/14  Yes Tonny Bollman, MD  nitroGLYCERIN (NITROSTAT) 0.4 MG SL tablet Place 1 tablet (0.4 mg total) under the tongue every 5 (five) minutes as needed. For chest pain 10/16/14  Yes Sherren Mocha, MD  potassium chloride SA (K-DUR,KLOR-CON) 20 MEQ tablet Take 1 tablet (20 mEq total) by mouth daily. With lasix only 10/16/14  Yes Sherren Mocha, MD  pravastatin (PRAVACHOL) 20 MG tablet TAKE ONE TABLET BY MOUTH ONCE DAILY 10/04/14  Yes Tonny Bollman, MD     No Known Allergies     Objective:  Physical Exam  Constitutional: He is oriented to person, place, and time. Vital signs are normal. He appears well-developed and well-nourished. He is active and cooperative. No distress.  BP 147/82 mmHg  Pulse 69  Temp(Src) 97.3 F (36.3 C)  Resp 16  Ht 6' 1.25" (1.861 m)  Wt 230 lb (104.327 kg)  BMI 30.12 kg/m2  HENT:  Head: Normocephalic and atraumatic.  Right Ear: Hearing normal.  Left Ear: Hearing normal.  Eyes: Conjunctivae are normal. No scleral icterus.  Neck: Normal range of motion. Neck supple. No thyromegaly present.  Cardiovascular: Normal rate, normal heart sounds and normal pulses.  An irregularly irregular rhythm present.  Pulses:      Radial pulses are 2+ on the right side, and 2+ on the left side.  Pulmonary/Chest: Effort normal and breath sounds normal.  Abdominal: Soft. Normal appearance and bowel sounds are normal. There is no hepatosplenomegaly. There is tenderness (palpation of the suprapubic area causes increased pain in the back) in the suprapubic area. There is no CVA tenderness.  Lymphadenopathy:       Head (right side):  No tonsillar, no preauricular, no posterior auricular and no occipital adenopathy present.       Head (left side): No tonsillar, no preauricular, no posterior auricular and no occipital adenopathy present.    He has no cervical adenopathy.       Right: No supraclavicular adenopathy present.       Left: No supraclavicular adenopathy present.  Neurological: He is alert and oriented to person, place, and time. No sensory deficit.  Skin: Skin is warm, dry and intact. No rash noted. No cyanosis or erythema. Nails show no clubbing.  Psychiatric: He has a normal mood and affect. His speech is normal and behavior is normal.     Results for orders placed or performed in visit on 01/12/15  POCT urinalysis dipstick  Result Value Ref Range   Color, UA orange (A) yellow   Clarity, UA cloudy (A) clear   Glucose, UA =100 (A) negative   Bilirubin, UA  small (A) negative   Ketones, POC UA trace (5) (A) negative   Spec Grav, UA 1.010    Blood, UA negative negative   pH, UA 5.5    Protein Ur, POC =100 (A) negative   Urobilinogen, UA 2.0    Nitrite, UA Positive (A) Negative   Leukocytes, UA Negative Negative  POCT Microscopic Urinalysis ([facility])  Result Value Ref Range   WBC,UR,HPF,POC None None WBC/hpf   RBC,UR,HPF,POC None None RBC/hpf   Bacteria None None, Too numerous to count   Mucus Absent Absent   Epithelial Cells, UR Per Microscopy Few (A) None, Too numerous to count cells/hpf         Assessment & Plan:   1. Bilateral low back pain without sciatica This seems musculoskeletal to me, as movement in the exam room exacerbates the pain low in his back, as does palpation of the suprapubic region. Constipation is another possibility. - POCT urinalysis dipstick - POCT Microscopic Urinalysis ([facility])  2. Abnormal urine findings Treat for the UTI he believes he has, as there are nitrites, though microscopy is normal. Also consider prostatitis, though he declines exam of the prostate. Await UCx. -  Urine culture - sulfamethoxazole-trimethoprim (BACTRIM DS,SEPTRA DS) 800-160 MG tablet; Take 1 tablet by mouth 2 (two) times daily.  Dispense: 20 tablet; Refill: 0  Glucosuria and proteinuria can be addressed at his PCP visit on 01/28/2015.   Fernande Bras, PA-C Physician Assistant-Certified [facility]

## 2015-01-14 LAB — URINE CULTURE
COLONY COUNT: NO GROWTH
Organism ID, Bacteria: NO GROWTH

## 2015-01-16 ENCOUNTER — Telehealth: Payer: Self-pay | Admitting: Cardiovascular Disease

## 2015-01-16 ENCOUNTER — Telehealth: Payer: Self-pay | Admitting: *Deleted

## 2015-01-16 ENCOUNTER — Encounter: Payer: Self-pay | Admitting: Nurse Practitioner

## 2015-01-16 ENCOUNTER — Ambulatory Visit (INDEPENDENT_AMBULATORY_CARE_PROVIDER_SITE_OTHER): Payer: Medicare Other | Admitting: Nurse Practitioner

## 2015-01-16 VITALS — BP 160/100 | HR 99 | Ht 75.5 in | Wt 226.8 lb

## 2015-01-16 DIAGNOSIS — E785 Hyperlipidemia, unspecified: Secondary | ICD-10-CM

## 2015-01-16 DIAGNOSIS — I10 Essential (primary) hypertension: Secondary | ICD-10-CM | POA: Diagnosis not present

## 2015-01-16 DIAGNOSIS — I4891 Unspecified atrial fibrillation: Secondary | ICD-10-CM | POA: Diagnosis not present

## 2015-01-16 DIAGNOSIS — I255 Ischemic cardiomyopathy: Secondary | ICD-10-CM

## 2015-01-16 NOTE — Progress Notes (Signed)
CARDIOLOGY OFFICE NOTE  Date:  01/16/2015    Timothy Olsen Date of Birth: 10-Sep-1955 Medical Record #[id number]  PCP:  Gwynneth Aliment, MD  Cardiologist:  Excell Seltzer    Chief Complaint  Patient presents with  . Congestive Heart Failure    2 month check. Seen for Dr. Excell Seltzer  . Atrial Fibrillation    History of Present Illness: Timothy Olsen is a 59 y.o. male who presents today for a follow up visit. Seen for Dr. Excell Seltzer. This is a 2 month check. He is followed for cardiomyopathy, hypertension, and diffuse small vessel coronary artery disease. He is limited by peripheral neuropathy related to long-standing diabetes. He had atrial fibrillation back in 2013 but was unable to tolerate any anticoagulant medications. He was tried on Coumadin and Xarelto. Says he 'almost bled out' on both drugs. Complained of severe epistaxis and has been unwilling to try any other anticoagulant drugs since.  Seen back earlier this summer - reported that his heart rate was higher - in the 80 to 90's. His baseline heart rate is generally about 60 bpm. No palpitations. No chest pain and was not short of breath. He is largely inactive because of peripheral neuropathy and foot problems. He complained of chronic ankle swelling.   I last saw him in July - pretty argumentative with his history to me. Not aware of his atrial fib. Continued to refuse anticoagulation. Said he was feeling ok. Said he restricts his salt but he eats out but brought in a bag from [employer].  I last saw him back in October - HR was high - I got him to agree to taking Diltiazem for better rate control. Continued to refuse anticoagulation.    Comes back today. Here alone. Doing pretty good. Notes his heart rate has been lower. No swelling. Trying to restrict his salt. Weight is down. No chest pain. Asking about his Indocin - PCP typically fills for him. Continues to refuse anticoagulation - we did discuss possible Watchman - he is NOT  interested. Has had some cold medicine today and BP up initially. He is pleased with how he is doing.  Past Medical History  Diagnosis Date  . Hypertension   . Diabetes mellitus   . CAD (coronary artery disease)     LHC 7/13: mOM1 50-75%, ostial subbranch 80-90% (very small), PL branch 80-90% (very small), mRCA 40-50%, EF 30% => med Rx for small vessel disease  . Atrial fibrillation (HCC)     CHADS2=3; Xarelto started 7/13; s/p TEE-DCCV => NSR;  patient back in AF with RVR at f/u 8/13 => amiodarone started => back in NSR 9/13 f/u OV  . Ischemic cardiomyopathy     echo 7/13: mild LVH, EF 25-30%, global HK with severe Hk to AK of basal to mid inf and post walls, severe diast dysfxn, mild MR, mod LAE, mod reduced RV fxn, mod RAE, PASP 50 (mild to mod pulmo HTN);  TEE 7/13 with EF 30-35%  . Epistaxis 08/2011    On heparin and Coumadin s/p silver nitrate cauterization L nare  . Hyperlipidemia   . Chronic combined systolic and diastolic CHF (congestive heart failure) (HCC)   . Gout   . Tobacco abuse   . Alcohol abuse   . MI (myocardial infarction) [facility])     Past Surgical History  Procedure Laterality Date  . Tonsillectomy    . Left thumb surgery    . Adenoidectomy    . Tee without cardioversion  09/15/2011    Procedure: TRANSESOPHAGEAL ECHOCARDIOGRAM (TEE);  Surgeon: Lewayne Bunting, MD;  Location: [facility] ENDOSCOPY;  Service: Cardiovascular;  Laterality: N/A;  . Cardioversion  09/15/2011    Procedure: CARDIOVERSION;  Surgeon: Lewayne Bunting, MD;  Location: [facility] ENDOSCOPY;  Service: Cardiovascular;  Laterality: N/A;  . Cardiac catheterization  09/09/2011    a) widely patent left main with minor luminal irregularities, patent LAD, mild prox LAD calcification and luminal irregularities, patent D1, diffuse prox and LCX- mid irregularity, 50-75% mid OM1 stenosis, 80-90% ostial stenosis in small subbranch, small PLB 80-90% stenosis, RCA- mild proximal luminal irregularity, mid 40-50% stenosis, distal  irregularity, PDA and PLB patent; LVEF 30% and global L  . Left heart catheterization with coronary angiogram N/A 09/09/2011    Procedure: LEFT HEART CATHETERIZATION WITH CORONARY ANGIOGRAM;  Surgeon: Tonny Bollman, MD;  Location: [facility] CATH LAB;  Service: Cardiovascular;  Laterality: N/A;     Medications: Current Outpatient Prescriptions  Medication Sig Dispense Refill  . allopurinol (ZYLOPRIM) 100 MG tablet Take 1 tablet (100 mg total) by mouth daily. 30 tablet 2  . aspirin EC 81 MG tablet Take 81 mg by mouth 2 (two) times a week.  1 tablet 0  . colchicine 0.6 MG tablet Take 2 tabs now at first sign of gout onset then 1 tab an hour later. 30 tablet 0  . diltiazem (CARDIZEM CD) 240 MG 24 hr capsule Take 1 capsule (240 mg total) by mouth daily. 90 capsule 3  . furosemide (LASIX) 40 MG tablet Take 1 tablet (40 mg total) by mouth daily. 30 tablet 11  . glipiZIDE (GLUCOTROL) 5 MG tablet Take 5 mg by mouth 2 (two) times daily before a meal.      . hydrALAZINE (APRESOLINE) 50 MG tablet Take 1 tablet (50 mg total) by mouth 3 (three) times daily. 90 tablet 6  . HYDROcodone-acetaminophen (NORCO/VICODIN) 5-325 MG per tablet Take 1 tablet by mouth every 6 (six) hours as needed for moderate pain. 30 tablet 0  . indomethacin (INDOCIN) 25 MG capsule Take 1 capsule (25 mg total) by mouth 3 (three) times daily with meals. 90 capsule 0  . lisinopril (PRINIVIL,ZESTRIL) 40 MG tablet Take 1 tablet (40 mg total) by mouth daily. 30 tablet 11  . metFORMIN (GLUCOPHAGE) 1000 MG tablet Take 1,000 mg by mouth as directed. Sliding Scale    . metoprolol (LOPRESSOR) 50 MG tablet Take 1 tablet (50 mg total) by mouth 2 (two) times daily. TAKE ONE TABLET BY MOUTH TWICE DAILY 60 tablet 11  . nitroGLYCERIN (NITROSTAT) 0.4 MG SL tablet Place 1 tablet (0.4 mg total) under the tongue every 5 (five) minutes as needed. For chest pain 25 tablet 0  . potassium chloride SA (K-DUR,KLOR-CON) 20 MEQ tablet Take 1 tablet (20 mEq total) by  mouth daily. With lasix only 30 tablet 0  . pravastatin (PRAVACHOL) 20 MG tablet TAKE ONE TABLET BY MOUTH ONCE DAILY 30 tablet 11  . sulfamethoxazole-trimethoprim (BACTRIM DS,SEPTRA DS) 800-160 MG tablet Take 1 tablet by mouth 2 (two) times daily. 20 tablet 0   No current facility-administered medications for this visit.    Allergies: No Known Allergies  Social History: The patient  reports that he has quit smoking. His smoking use included Cigarettes. He has a 1 pack-year smoking history. He has never used smokeless tobacco. He reports that he drinks about 3.0 oz of alcohol per week. He reports that he does not use illicit drugs.   Family History: The patient's family  history includes Diabetes type II in his mother; Hypertension in his father.   Review of Systems: Please see the history of present illness.   Otherwise, the review of systems is positive for none.   All other systems are reviewed and negative.   Physical Exam: VS:  BP 160/100 mmHg  Pulse 99  Ht 6' 3.5" (1.918 m)  Wt 226 lb 12.8 oz (102.876 kg)  BMI 27.97 kg/m2 .  BMI Body mass index is 27.97 kg/(m^2).  Wt Readings from Last 3 Encounters:  01/16/15 226 lb 12.8 oz (102.876 kg)  01/12/15 230 lb (104.327 kg)  11/23/14 233 lb (105.688 kg)   BP by me is 130/80.   General: Pleasant. Well developed, well nourished and in no acute distress. Weight is down 7 pounds.  HEENT: Normal. Neck: Supple, no JVD, carotid bruits, or masses noted.  Cardiac: Irregular irregular rhythm. He has better rate control.  No edema.  Respiratory:  Lungs are clear to auscultation bilaterally with normal work of breathing.  GI: Soft and nontender.  MS: No deformity or atrophy. Gait and ROM intact. Skin: Warm and dry. Color is normal.  Neuro:  Strength and sensation are intact and no gross focal deficits noted.  Psych: Alert, appropriate and with normal affect.   LABORATORY DATA:  EKG:  EKG is ordered today. This demonstrates atrial fib  with a HR of 99.  Lab Results  Component Value Date   WBC 6.9 11/23/2014   HGB 14.0 11/23/2014   HCT 42.7 11/23/2014   PLT 178 11/23/2014   GLUCOSE 109* 11/23/2014   CHOL 155 09/09/2011   TRIG 76 09/09/2011   HDL 41 09/09/2011   LDLCALC 99 09/09/2011   ALT <8 12/15/2013   AST 14 12/15/2013   NA 140 11/23/2014   K 4.2 11/23/2014   CL 107 11/23/2014   CREATININE 1.21 11/23/2014   BUN 19 11/23/2014   CO2 25 11/23/2014   TSH 1.46 08/23/2014   INR 2.20* 09/19/2011   HGBA1C 6.1 10/12/2014    BNP (last 3 results) No results for input(s): BNP in the last 8760 hours.  ProBNP (last 3 results)  Recent Labs  10/12/14 1207  PROBNP 1834.00*     Other Studies Reviewed Today:  Echo Study Conclusions from 08/17/2014  - Left ventricle: The cavity size was mildly dilated. Wall thickness was normal. Systolic function was mildly reduced. The estimated ejection fraction was in the range of 45% to 50%. Hypokinesis of the basal-midinferolateral myocardium. - Mitral valve: There was mild to moderate regurgitation directed centrally. - Left atrium: The atrium was severely dilated. - Right atrium: The atrium was mildly dilated. - Pulmonary arteries: Systolic pressure was mildly to moderately increased. PA peak pressure: 48 mm Hg (S).  Cardiac Cath 09/09/2011:  Coronary angiography:  Left mainstem: Widely patent with minor luminal irregularity.  Left anterior descending (LAD): Patent to the left ventricular apex. The first diagonal branch is patent. There is mild proximal LAD calcification and also mild luminal irregularity but there are no significant stenoses throughout the course of the LAD distribution.  Left circumflex (LCx): The left circumflex is a large-caliber vessel. The proximal and mid circumflex have mild diffuse irregularity. The first OM is patent but in the midportion of this vessel there is 50-75% stenosis. There is also a subbranch that has tight  ostial stenosis but this is a very small vessel. The percent stenosis is estimated at 80-90. The continuation of the AV groove circumflex supplies a posterolateral branch  which also has 80-90% stenosis. This is a very small vessel less than 1.5 mm in caliber.  Right coronary artery (RCA): The right coronary artery is dominant. There is mild luminal irregularity in the proximal vessel. The mid vessel has 40-50% stenosis. The distal vessel has luminal irregularity. The PDA and posterolateral branches are patent  Left ventriculography: The left ventricle is poorly visualized with ventriculography, despite a 30 cc injection. However, there is clearly global left ventricular dysfunction and I would estimate the ejection fraction in the range of 30%.  Final Conclusions:  1. 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 Nonobstructive LAD and right coronary artery stenoses 3. Severe global left ventricular dysfunction  Recommendations: Medical therapy. Plan will have to be made regarding anticoagulation in this patient with atrial fibrillation and cardiomyopathy, but this is complicated by concerns about medicine adherence and alcohol use.   ASSESSMENT AND PLAN: 1. CAD, native vessel: no angina. Continue current Rx's.   2. Atrial fibrillation with RVR. He is unwilling to take anticoagulant drugs because of past bleeding experiences on warfarin and DOAC medications. His rate has previously been more controlled with the increase in beta blocker. At last visit I replaced his Norvasc with Diltiazem 240 mg a day. Echo with signficant LAE - making restoration of NSR unlikely. He now has better rate control.    3. Cardiomyopathy: multifactorial with AF, malignant HTN, and CAD. Medications as outlined above. He is on a pretty good regimen. Weight is down. I have left him on his current regimen. He is doing better with sodium restriction.   4.  HTN: controlled on present treatment. Recheck by me is stable.   5. Swelling - seems better. His weight is down. Looks better today. Asking for Indocin -  I would favor limited use of this and to be refilled by PCP  Current medicines are reviewed with the patient today.  The patient does not have concerns regarding medicines other than what has been noted above.  The following changes have been made:  See above.  Labs/ tests ordered today include:   No orders of the defined types were placed in this encounter.     Disposition:   FU with me in 3 months.   Patient is agreeable to this plan and will call if any problems develop in the interim.   Signed: Rosalio MacadamiaLori C. Devarius Nelles, RN, ANP-C 01/16/2015 12:02 PM  [facility] [address] [place], [place]  [address] Phone: [phone]) [phone] Fax: [phone]

## 2015-01-16 NOTE — Telephone Encounter (Signed)
Pt was seen in our office today and stated needed indocin ( 25 mg ) daily filled. Stated our office has not filled this medication need to get refilled by PCP.  Norma FredricksonLori Gerhardt, NP, stated to call pharmacy to make sure medication was getting directed to right PCP. Stated medication was already filled and was done 3 days ago.

## 2015-01-16 NOTE — Telephone Encounter (Signed)
I attempted to reach Amber and the extension provided does not connect to [facility].  The person attempted to transfer me to another extension and the call was disconnected.  The pt was seen in the office today but I will not fax a copy of OV note because I am unsure if the number is correct.  At this time I will await a return [facility] further [facility].  The pt does have a diagnosis of CHF.

## 2015-01-16 NOTE — Telephone Encounter (Signed)
New Message (general)   [facility] states that the pt was referred for heart failure management. Pt is not sure if this is a long term diagnosis. [facility] requests a call back to determine if the pt is CHF. She states that their office will need the dates of the pt's BP's and pulse rates along with the confirmation that the pt is indeed CHF.  She listed her fax number.  Fax # [phone]

## 2015-01-16 NOTE — Patient Instructions (Addendum)
We will be checking the following labs today - NONE   Medication Instructions:    Continue with your current medicines.     Testing/Procedures To Be Arranged:  N/A  Follow-Up:   See me in 3 months    Other Special Instructions:   N/A    If you need a refill on your cardiac medications before your next appointment, please call your pharmacy.   Call the [facility] office at [phone] if you have any questions, problems or concerns.      

## 2015-01-23 ENCOUNTER — Telehealth: Payer: Self-pay | Admitting: Cardiovascular Disease

## 2015-01-23 NOTE — Telephone Encounter (Signed)
[facility] and provided her with requested information--EF, pulse and BP and confirmed diagnosis of CHF.  They will enroll pt in heart failure program

## 2015-01-23 NOTE — Telephone Encounter (Signed)
°  New Prob   Nurse is calling to verify heart failure diagnosis, BP, and pulse for pt. She states he is interested in enrolling in the heart failure program. Please call.

## 2015-02-28 DIAGNOSIS — I119 Hypertensive heart disease without heart failure: Secondary | ICD-10-CM | POA: Diagnosis not present

## 2015-02-28 DIAGNOSIS — E1340 Other specified diabetes mellitus with diabetic neuropathy, unspecified: Secondary | ICD-10-CM | POA: Diagnosis not present

## 2015-02-28 DIAGNOSIS — R7309 Other abnormal glucose: Secondary | ICD-10-CM | POA: Diagnosis not present

## 2015-02-28 DIAGNOSIS — R109 Unspecified abdominal pain: Secondary | ICD-10-CM | POA: Diagnosis not present

## 2015-03-25 ENCOUNTER — Other Ambulatory Visit: Payer: Self-pay | Admitting: Family Medicine

## 2015-04-17 ENCOUNTER — Ambulatory Visit: Payer: Medicare Other | Admitting: Nurse Practitioner

## 2015-04-18 ENCOUNTER — Other Ambulatory Visit: Payer: Self-pay | Admitting: Family Medicine

## 2015-05-11 DIAGNOSIS — M109 Gout, unspecified: Secondary | ICD-10-CM | POA: Diagnosis not present

## 2015-05-11 DIAGNOSIS — L0390 Cellulitis, unspecified: Secondary | ICD-10-CM | POA: Diagnosis not present

## 2015-05-30 ENCOUNTER — Ambulatory Visit (INDEPENDENT_AMBULATORY_CARE_PROVIDER_SITE_OTHER): Payer: Medicare Other | Admitting: Nurse Practitioner

## 2015-05-30 ENCOUNTER — Encounter: Payer: Self-pay | Admitting: Nurse Practitioner

## 2015-05-30 VITALS — BP 128/70 | HR 68 | Ht 76.0 in | Wt 220.1 lb

## 2015-05-30 DIAGNOSIS — I255 Ischemic cardiomyopathy: Secondary | ICD-10-CM

## 2015-05-30 DIAGNOSIS — I10 Essential (primary) hypertension: Secondary | ICD-10-CM | POA: Diagnosis not present

## 2015-05-30 DIAGNOSIS — I482 Chronic atrial fibrillation, unspecified: Secondary | ICD-10-CM

## 2015-05-30 DIAGNOSIS — E785 Hyperlipidemia, unspecified: Secondary | ICD-10-CM

## 2015-05-30 LAB — HEPATIC FUNCTION PANEL
ALT: 12 U/L (ref 9–46)
AST: 14 U/L (ref 10–35)
Albumin: 4.1 g/dL (ref 3.6–5.1)
Alkaline Phosphatase: 64 U/L (ref 40–115)
Bilirubin, Direct: 0.2 mg/dL (ref <=0.2)
Indirect Bilirubin: 0.8 mg/dL (ref 0.2–1.2)
Total Bilirubin: 1 mg/dL (ref 0.2–1.2)
Total Protein: 6.6 g/dL (ref 6.1–8.1)

## 2015-05-30 LAB — BASIC METABOLIC PANEL
BUN: 15 mg/dL (ref 7–25)
CO2: 27 mmol/L (ref 20–31)
Calcium: 9.1 mg/dL (ref 8.6–10.3)
Chloride: 104 mmol/L (ref 98–110)
Creat: 1.22 mg/dL (ref 0.70–1.33)
Glucose, Bld: 131 mg/dL — ABNORMAL HIGH (ref 65–99)
Potassium: 4.2 mmol/L (ref 3.5–5.3)
Sodium: 140 mmol/L (ref 135–146)

## 2015-05-30 LAB — LIPID PANEL
Cholesterol: 169 mg/dL (ref 125–200)
HDL: 62 mg/dL (ref >=40)
LDL Cholesterol: 77 mg/dL (ref <130)
Total CHOL/HDL Ratio: 2.7 Ratio (ref <=5.0)
Triglycerides: 149 mg/dL (ref <150)
VLDL: 30 mg/dL (ref <30)

## 2015-05-30 NOTE — Progress Notes (Signed)
CARDIOLOGY OFFICE NOTE  Date:  05/30/2015    Timothy Olsen Date of Birth: 1955-07-01 Medical Record #[id number]  PCP:  Gwynneth Aliment, MD  Cardiologist:  Excell Seltzer    Chief Complaint  Patient presents with  . Coronary Artery Disease  . Atrial Fibrillation  . Cardiomyopathy  . Hypertension    4 month check - seen for Dr. Excell Seltzer    History of Present Illness: Timothy Olsen is a 60 y.o. male who presents today for a 4 month check. Seen for Dr. Excell Seltzer.   He is followed for cardiomyopathy, hypertension, and diffuse small vessel coronary artery disease. He is limited by peripheral neuropathy related to long-standing diabetes. He had atrial fibrillation back in 2013 which is now chronic and is unable to tolerate any anticoagulant medications. He was tried on Coumadin and Xarelto. Says he 'almost bled out' on both drugs. Complained of severe epistaxis and has been unwilling to try any other anticoagulant drugs since.  I have seen him back several times - still very adamant about not taking anticoagulation. Did get him to take CCB for better rate control. Not felt to be a candidate for Watchman.  Comes back today. Here alone. Has had more issues with his gout but this has gotten better. He has cut back on his eating and has lost weight. No chest pain. Breathing is good. He is limiting his salt. No real issues and he feels like he is doing well.   Past Medical History  Diagnosis Date  . Hypertension   . Diabetes mellitus   . CAD (coronary artery disease)     LHC 7/13: mOM1 50-75%, ostial subbranch 80-90% (very small), PL branch 80-90% (very small), mRCA 40-50%, EF 30% => med Rx for small vessel disease  . Atrial fibrillation (HCC)     CHADS2=3; Xarelto started 7/13; s/p TEE-DCCV => NSR;  patient back in AF with RVR at f/u 8/13 => amiodarone started => back in NSR 9/13 f/u OV  . Ischemic cardiomyopathy     echo 7/13: mild LVH, EF 25-30%, global HK with severe Hk to AK of basal  to mid inf and post walls, severe diast dysfxn, mild MR, mod LAE, mod reduced RV fxn, mod RAE, PASP 50 (mild to mod pulmo HTN);  TEE 7/13 with EF 30-35%  . Epistaxis 08/2011    On heparin and Coumadin s/p silver nitrate cauterization L nare  . Hyperlipidemia   . Chronic combined systolic and diastolic CHF (congestive heart failure) (HCC)   . Gout   . Tobacco abuse   . Alcohol abuse   . MI (myocardial infarction) [facility])     Past Surgical History  Procedure Laterality Date  . Tonsillectomy    . Left thumb surgery    . Adenoidectomy    . Tee without cardioversion  09/15/2011    Procedure: TRANSESOPHAGEAL ECHOCARDIOGRAM (TEE);  Surgeon: Lewayne Bunting, MD;  Location: [facility] ENDOSCOPY;  Service: Cardiovascular;  Laterality: N/A;  . Cardioversion  09/15/2011    Procedure: CARDIOVERSION;  Surgeon: Lewayne Bunting, MD;  Location: [facility] ENDOSCOPY;  Service: Cardiovascular;  Laterality: N/A;  . Cardiac catheterization  09/09/2011    a) widely patent left main with minor luminal irregularities, patent LAD, mild prox LAD calcification and luminal irregularities, patent D1, diffuse prox and LCX- mid irregularity, 50-75% mid OM1 stenosis, 80-90% ostial stenosis in small subbranch, small PLB 80-90% stenosis, RCA- mild proximal luminal irregularity, mid 40-50% stenosis, distal irregularity, PDA and PLB patent;  LVEF 30% and global L  . Left heart catheterization with coronary angiogram N/A 09/09/2011    Procedure: LEFT HEART CATHETERIZATION WITH CORONARY ANGIOGRAM;  Surgeon: Tonny Bollman, MD;  Location: [facility] CATH LAB;  Service: Cardiovascular;  Laterality: N/A;     Medications: Current Outpatient Prescriptions  Medication Sig Dispense Refill  . allopurinol (ZYLOPRIM) 100 MG tablet Take 100 mg by mouth as needed (for gout flare up).    Marland Kitchen aspirin EC 81 MG tablet Take 81 mg by mouth 2 (two) times a week.  1 tablet 0  . colchicine 0.6 MG tablet Take 0.6 mg by mouth as needed (2 tabs at first sign or gout flare up  than 1 tab an hour later).    Marland Kitchen diltiazem (CARDIZEM CD) 240 MG 24 hr capsule Take 1 capsule (240 mg total) by mouth daily. 90 capsule 3  . furosemide (LASIX) 40 MG tablet Take 1 tablet (40 mg total) by mouth daily. 30 tablet 11  . glipiZIDE (GLUCOTROL) 5 MG tablet Take 5 mg by mouth 2 (two) times daily before a meal.      . hydrALAZINE (APRESOLINE) 50 MG tablet Take 1 tablet (50 mg total) by mouth 3 (three) times daily. 90 tablet 6  . HYDROcodone-acetaminophen (NORCO/VICODIN) 5-325 MG per tablet Take 1 tablet by mouth every 6 (six) hours as needed for moderate pain. 30 tablet 0  . indomethacin (INDOCIN) 25 MG capsule Take 25 mg by mouth as needed (gout flare up).    Marland Kitchen lisinopril (PRINIVIL,ZESTRIL) 40 MG tablet Take 1 tablet (40 mg total) by mouth daily. 30 tablet 11  . metFORMIN (GLUCOPHAGE) 1000 MG tablet Take 1,000 mg by mouth as directed. Sliding Scale    . metoprolol (LOPRESSOR) 50 MG tablet Take 1 tablet (50 mg total) by mouth 2 (two) times daily. TAKE ONE TABLET BY MOUTH TWICE DAILY 60 tablet 11  . nitroGLYCERIN (NITROSTAT) 0.4 MG SL tablet Place 1 tablet (0.4 mg total) under the tongue every 5 (five) minutes as needed. For chest pain 25 tablet 0  . potassium chloride SA (K-DUR,KLOR-CON) 20 MEQ tablet Take 1 tablet (20 mEq total) by mouth daily. With lasix only 30 tablet 0  . pravastatin (PRAVACHOL) 40 MG tablet Take 40 mg by mouth daily.      No current facility-administered medications for this visit.    Allergies: No Known Allergies  Social History: The patient  reports that he has quit smoking. His smoking use included Cigarettes. He has a 1 pack-year smoking history. He has never used smokeless tobacco. He reports that he drinks about 3.0 oz of alcohol per week. He reports that he does not use illicit drugs.   Family History: The patient's family history includes Diabetes type II in his mother; Hypertension in his father.   Review of Systems: Please see the history of present  illness.   Otherwise, the review of systems is positive for none.   All other systems are reviewed and negative.   Physical Exam: VS:  BP 128/70 mmHg  Pulse 68  Ht  (1.93 m)  Wt 220 lb 1.9 oz (99.846 kg)  BMI 26.81 kg/m2 .  BMI Body mass index is 26.81 kg/(m^2).  Wt Readings from Last 3 Encounters:  05/30/15 220 lb 1.9 oz (99.846 kg)  01/16/15 226 lb 12.8 oz (102.876 kg)  01/12/15 230 lb (104.327 kg)    General: Very talkative - likes to get his point across. He is alert and in no acute distress. He  has lost 10 pounds since last visit here.  HEENT: Normal. Neck: Supple, no JVD, carotid bruits, or masses noted.  Cardiac: Regular rate and rhythm. No murmurs, rubs, or gallops. No edema.  Respiratory:  Lungs are clear to auscultation bilaterally with normal work of breathing.  GI: Soft and nontender.  MS: No deformity or atrophy. Gait and ROM intact. Skin: Warm and dry. Color is normal.  Neuro:  Strength and sensation are intact and no gross focal deficits noted.  Psych: Alert, appropriate and with normal affect.   LABORATORY DATA:  EKG:  EKG is not ordered today.  Lab Results  Component Value Date   WBC 6.9 11/23/2014   HGB 14.0 11/23/2014   HCT 42.7 11/23/2014   PLT 178 11/23/2014   GLUCOSE 109* 11/23/2014   CHOL 155 09/09/2011   TRIG 76 09/09/2011   HDL 41 09/09/2011   LDLCALC 99 09/09/2011   ALT <8 12/15/2013   AST 14 12/15/2013   NA 140 11/23/2014   K 4.2 11/23/2014   CL 107 11/23/2014   CREATININE 1.21 11/23/2014   BUN 19 11/23/2014   CO2 25 11/23/2014   TSH 1.46 08/23/2014   INR 2.20* 09/19/2011   HGBA1C 6.1 10/12/2014    BNP (last 3 results) No results for input(s): BNP in the last 8760 hours.  ProBNP (last 3 results)  Recent Labs  10/12/14 1207  PROBNP 1834.00*     Other Studies Reviewed Today:  Echo Study Conclusions from 08/17/2014  - Left ventricle: The cavity size was mildly dilated. Wall thickness was normal. Systolic function was  mildly reduced. The estimated ejection fraction was in the range of 45% to 50%. Hypokinesis of the basal-midinferolateral myocardium. - Mitral valve: There was mild to moderate regurgitation directed centrally. - Left atrium: The atrium was severely dilated. - Right atrium: The atrium was mildly dilated. - Pulmonary arteries: Systolic pressure was mildly to moderately increased. PA peak pressure: 48 mm Hg (S).  Cardiac Cath 09/09/2011:  Coronary angiography:  Left mainstem: Widely patent with minor luminal irregularity.  Left anterior descending (LAD): Patent to the left ventricular apex. The first diagonal branch is patent. There is mild proximal LAD calcification and also mild luminal irregularity but there are no significant stenoses throughout the course of the LAD distribution.  Left circumflex (LCx): The left circumflex is a large-caliber vessel. The proximal and mid circumflex have mild diffuse irregularity. The first OM is patent but in the midportion of this vessel there is 50-75% stenosis. There is also a subbranch that has tight ostial stenosis but this is a very small vessel. The percent stenosis is estimated at 80-90. The continuation of the AV groove circumflex supplies a posterolateral branch which also has 80-90% stenosis. This is a very small vessel less than 1.5 mm in caliber.  Right coronary artery (RCA): The right coronary artery is dominant. There is mild luminal irregularity in the proximal vessel. The mid vessel has 40-50% stenosis. The distal vessel has luminal irregularity. The PDA and posterolateral branches are patent  Left ventriculography: The left ventricle is poorly visualized with ventriculography, despite a 30 cc injection. However, there is clearly global left ventricular dysfunction and I would estimate the ejection fraction in the range of 30%.  Final Conclusions:  1. Distal vessel coronary artery disease primarily in the left  circumflex distribution as detailed above 2. Nonobstructive LAD and right coronary artery stenoses 3. Severe global left ventricular dysfunction  Recommendations: Medical therapy. Plan will have to be made  regarding anticoagulation in this patient with atrial fibrillation and cardiomyopathy, but this is complicated by concerns about medicine adherence and alcohol use.   ASSESSMENT AND PLAN: 1. CAD, native vessel: no angina. Continue current Rx's.   2. Atrial fibrillation with RVR. He is unwilling to take anticoagulant drugs because of past bleeding experiences on warfarin and DOAC medications. His rate has previously been more controlled with the increase in beta blocker. At last visit I replaced his Norvasc with Diltiazem 240 mg a day. Echo with signficant LAE - making restoration of NSR unlikely. He now has better rate control. He is basically asymptomatic - no change in his current regimen.   3. Cardiomyopathy: multifactorial with AF, malignant HTN, and CAD. Medications as outlined above. He is on a pretty good regimen. Weight is down. I have left him on his current regimen. He is doing better with sodium restriction.   4. HTN: controlled on present treatment.   5. HLD - checking lipids today with LFTs.   Current medicines are reviewed with the patient today.  The patient does not have concerns regarding medicines other than what has been noted above.  The following changes have been made:  See above.  Labs/ tests ordered today include:    Orders Placed This Encounter  Procedures  . Basic metabolic panel  . Hepatic function panel  . Lipid panel     Disposition:   FU with me in 6 months.   Patient is agreeable to this plan and will call if any problems develop in the interim.   Signed: Rosalio MacadamiaLori C. Deltha Bernales, RN, ANP-C 05/30/2015 11:26 AM  [facility] [address] [place], [place]  [address] Phone: [phone] Fax: [phone])  [phone]

## 2015-05-30 NOTE — Patient Instructions (Addendum)
We will be checking the following labs today - BMET, HPF and Lipids   Medication Instructions:    Continue with your current medicines.     Testing/Procedures To Be Arranged:  N/A  Follow-Up:   See me in 6 months.     Other Special Instructions:   Keep up the good work!    If you need a refill on your cardiac medications before your next appointment, please call your pharmacy.   Call the [facility] office at [phone] if you have any questions, problems or concerns.

## 2015-05-31 ENCOUNTER — Other Ambulatory Visit: Payer: Self-pay | Admitting: Nurse Practitioner

## 2015-08-20 ENCOUNTER — Telehealth: Payer: Self-pay | Admitting: Cardiovascular Disease

## 2015-08-20 DIAGNOSIS — I10 Essential (primary) hypertension: Secondary | ICD-10-CM

## 2015-08-20 DIAGNOSIS — E785 Hyperlipidemia, unspecified: Secondary | ICD-10-CM

## 2015-08-20 DIAGNOSIS — I255 Ischemic cardiomyopathy: Secondary | ICD-10-CM

## 2015-08-20 DIAGNOSIS — I4891 Unspecified atrial fibrillation: Secondary | ICD-10-CM

## 2015-08-20 MED ORDER — FUROSEMIDE 40 MG PO TABS: ORAL_TABLET | ORAL | Status: AC

## 2015-08-20 NOTE — Telephone Encounter (Signed)
Pt c/o swelling: STAT is pt has developed SOB within 24 hours  1. How long have you been experiencing swelling? 3 or 4 days  2. Where is the swelling located? Both legs swollen below knees  3.  Are you currently taking a "fluid pill"?yes  4.  Are you currently SOB? no  5.  Have you traveled recently? No  Pt is also concerned about his HR 96

## 2015-08-20 NOTE — Telephone Encounter (Signed)
Calling stating he has swelling in both legs.  His (R) leg is better since starting back on Furosemide one week ago. His (L) leg is swollen from  thigh to foot but seems to also getting better.  States he stopped taking the Furosemide 40 mg daily last Oct because he was  urinating  all the time. He restarted Furosemide 20 mg one week ago; but took 40 mg yesterday.  Also states his HR has been 96-114 at times.  BP usually runs 120-130  but occ goes up to 160-170.  States if his BP is low at night he does not take the third dose Hydralazine. States he does watch his salt intake.  Is not SOB.  Does not know what his wt is because he doesn't weigh himself.  Spoke w/Lori Gerhardt,NP who advised to take the Furosemide 40 mg today and tomorrow and if swelling hasn't decreased by end of this week will need to call back and schedule LE doppler study.  For HR may take extra 1/2 dose of Metoprolol if HR greater than 100-110. That would be taking Metoprolol 75 mg BID.  Tried to express to him how important it is to take his medication as directed.  He states he understands but that if taking the Furosemide causes him to urinate all the time he has to do what "my body tells me".  He states that also applies to taking the Hydralazine.  He verbalizes understanding and will call if swelling or HR is not better by end of this week.

## 2015-08-20 NOTE — Telephone Encounter (Signed)
Lm to call back

## 2015-08-28 ENCOUNTER — Other Ambulatory Visit: Payer: Self-pay | Admitting: Cardiovascular Disease

## 2015-09-05 ENCOUNTER — Other Ambulatory Visit: Payer: Self-pay | Admitting: Cardiovascular Disease

## 2015-09-05 DIAGNOSIS — I4891 Unspecified atrial fibrillation: Secondary | ICD-10-CM

## 2015-09-05 DIAGNOSIS — I10 Essential (primary) hypertension: Secondary | ICD-10-CM

## 2015-09-05 DIAGNOSIS — I255 Ischemic cardiomyopathy: Secondary | ICD-10-CM

## 2015-09-05 DIAGNOSIS — E785 Hyperlipidemia, unspecified: Secondary | ICD-10-CM

## 2015-11-13 ENCOUNTER — Encounter: Payer: Self-pay | Admitting: Nurse Practitioner

## 2015-11-27 ENCOUNTER — Ambulatory Visit (INDEPENDENT_AMBULATORY_CARE_PROVIDER_SITE_OTHER): Payer: Medicare Other | Admitting: Nurse Practitioner

## 2015-11-27 ENCOUNTER — Telehealth: Payer: Self-pay | Admitting: *Deleted

## 2015-11-27 ENCOUNTER — Encounter: Payer: Self-pay | Admitting: Nurse Practitioner

## 2015-11-27 VITALS — BP 140/90 | HR 88 | Ht 76.0 in | Wt 217.0 lb

## 2015-11-27 DIAGNOSIS — I482 Chronic atrial fibrillation, unspecified: Secondary | ICD-10-CM

## 2015-11-27 DIAGNOSIS — E7800 Pure hypercholesterolemia, unspecified: Secondary | ICD-10-CM

## 2015-11-27 DIAGNOSIS — I10 Essential (primary) hypertension: Secondary | ICD-10-CM | POA: Diagnosis not present

## 2015-11-27 DIAGNOSIS — I255 Ischemic cardiomyopathy: Secondary | ICD-10-CM

## 2015-11-27 NOTE — Telephone Encounter (Signed)
S/w Meridith @ [phone], Ulyses AmorJennifer Olsen's office will fax recent labs.

## 2015-11-27 NOTE — Progress Notes (Signed)
CARDIOLOGY OFFICE NOTE  Date:  11/27/2015    Timothy Olsen Date of Birth: July 30, 1955 Medical Record #[id number]  PCP:  Boneta Lucks, NP  Cardiologist:  Kirt Boys  Chief Complaint  Patient presents with  . Cardiomyopathy    6 month check - seen for Dr. Excell Seltzer    History of Present Illness: Timothy Olsen is a 60 y.o. male who presents today for a 6 month check. Seen for Dr. Excell Seltzer.   He is followed for cardiomyopathy, hypertension, and diffuse small vessel coronary artery disease. He is limited by peripheral neuropathy related to long-standing diabetes. He had atrial fibrillation back in 2013 which is now chronic and is unable to tolerate any anticoagulant medications. He was tried on Coumadin and Xarelto. Says he 'almost bled out' on both drugs. Complained of severe epistaxis and has been unwilling to try any other anticoagulant drugs since.  I have seen him back several times - still very adamant about not taking anticoagulation. Did get him to take CCB for better rate control. Not felt to be a candidate for Watchman.  Last seen back in April - had actually cut back on his salt, had lost some weight. He was felt to be doing ok.   Comes back today. Here alone. Now going to [facility] for his primary care - says he has had "lots of blood drawn". Some allergies noted. No significant chest pain but keeps telling me that with the weather changes "you will have problems". Breathing seems to be ok. No real palpitations. Seems to be holding his own. Sounds like he is taking his medicines for the most part - sometimes will hoild a dose of his Hydralazine if BP too low for him.  Says he knows he's "being a smart ass today".   Past Medical History:  Diagnosis Date  . Alcohol abuse   . Atrial fibrillation (HCC)    CHADS2=3; Xarelto started 7/13; s/p TEE-DCCV => NSR;  patient back in AF with RVR at f/u 8/13 => amiodarone started => back in NSR 9/13 f/u OV  . CAD (coronary  artery disease)    LHC 7/13: mOM1 50-75%, ostial subbranch 80-90% (very small), PL branch 80-90% (very small), mRCA 40-50%, EF 30% => med Rx for small vessel disease  . Chronic combined systolic and diastolic CHF (congestive heart failure) (HCC)   . Diabetes mellitus   . Epistaxis 08/2011   On heparin and Coumadin s/p silver nitrate cauterization L nare  . Gout   . Hyperlipidemia   . Hypertension   . Ischemic cardiomyopathy    echo 7/13: mild LVH, EF 25-30%, global HK with severe Hk to AK of basal to mid inf and post walls, severe diast dysfxn, mild MR, mod LAE, mod reduced RV fxn, mod RAE, PASP 50 (mild to mod pulmo HTN);  TEE 7/13 with EF 30-35%  . MI (myocardial infarction)   . Tobacco abuse     Past Surgical History:  Procedure Laterality Date  . ADENOIDECTOMY    . CARDIAC CATHETERIZATION  09/09/2011   a) widely patent left main with minor luminal irregularities, patent LAD, mild prox LAD calcification and luminal irregularities, patent D1, diffuse prox and LCX- mid irregularity, 50-75% mid OM1 stenosis, 80-90% ostial stenosis in small subbranch, small PLB 80-90% stenosis, RCA- mild proximal luminal irregularity, mid 40-50% stenosis, distal irregularity, PDA and PLB patent; LVEF 30% and global L  . CARDIOVERSION  09/15/2011   Procedure: CARDIOVERSION;  Surgeon: Madolyn Frieze  Jens Som, MD;  Location: [facility] ENDOSCOPY;  Service: Cardiovascular;  Laterality: N/A;  . LEFT HEART CATHETERIZATION WITH CORONARY ANGIOGRAM N/A 09/09/2011   Procedure: LEFT HEART CATHETERIZATION WITH CORONARY ANGIOGRAM;  Surgeon: Tonny Bollman, MD;  Location: [facility] CATH LAB;  Service: Cardiovascular;  Laterality: N/A;  . left thumb surgery    . TEE WITHOUT CARDIOVERSION  09/15/2011   Procedure: TRANSESOPHAGEAL ECHOCARDIOGRAM (TEE);  Surgeon: Lewayne Bunting, MD;  Location: [facility] ENDOSCOPY;  Service: Cardiovascular;  Laterality: N/A;  . TONSILLECTOMY       Medications: Current Outpatient Prescriptions  Medication Sig Dispense  Refill  . allopurinol (ZYLOPRIM) 100 MG tablet Take 100 mg by mouth as needed (for gout flare up).    Marland Kitchen aspirin EC 81 MG tablet Take 81 mg by mouth 2 (two) times a week.  1 tablet 0  . colchicine 0.6 MG tablet Take 0.6 mg by mouth as needed (2 tabs at first sign or gout flare up than 1 tab an hour later).    Marland Kitchen diltiazem (CARDIZEM CD) 240 MG 24 hr capsule Take 1 capsule (240 mg total) by mouth daily. 90 capsule 3  . furosemide (LASIX) 40 MG tablet Is taking as needed 30 tablet 11  . glipiZIDE (GLUCOTROL) 5 MG tablet Take 5 mg by mouth 2 (two) times daily before a meal.      . hydrALAZINE (APRESOLINE) 50 MG tablet TAKE ONE TABLET BY MOUTH THREE TIMES DAILY 270 tablet 3  . HYDROcodone-acetaminophen (NORCO/VICODIN) 5-325 MG per tablet Take 1 tablet by mouth every 6 (six) hours as needed for moderate pain. 30 tablet 0  . indomethacin (INDOCIN) 25 MG capsule Take 25 mg by mouth as needed (gout flare up).    Marland Kitchen lisinopril (PRINIVIL,ZESTRIL) 40 MG tablet TAKE ONE TABLET BY MOUTH ONCE DAILY 90 tablet 2  . metFORMIN (GLUCOPHAGE) 1000 MG tablet Take 1,000 mg by mouth as directed. Sliding Scale    . metoprolol (LOPRESSOR) 50 MG tablet TAKE ONE TABLET BY MOUTH TWICE DAILY; MAY TAKE EXTRA 1/2 TABLET (TOTAL OF 75 MG) BID IF HR GREATER THAN 100. 60 tablet 11  . nitroGLYCERIN (NITROSTAT) 0.4 MG SL tablet Place 1 tablet (0.4 mg total) under the tongue every 5 (five) minutes as needed. For chest pain 25 tablet 0  . potassium chloride SA (K-DUR,KLOR-CON) 20 MEQ tablet Take 1 tablet (20 mEq total) by mouth daily. With lasix only 30 tablet 0  . pravastatin (PRAVACHOL) 40 MG tablet Take 40 mg by mouth daily.      No current facility-administered medications for this visit.     Allergies: No Known Allergies  Social History: The patient  reports that he has quit smoking. His smoking use included Cigarettes. He has a 1.00 pack-year smoking history. He has never used smokeless tobacco. He reports that he drinks about  3.0 oz of alcohol per week . He reports that he does not use drugs.   Family History: The patient's family history includes Diabetes type II in his mother; Hypertension in his father.   Review of Systems: Please see the history of present illness.   Otherwise, the review of systems is positive for none.   All other systems are reviewed and negative.   Physical Exam: VS:  BP 140/90   Pulse 88   Ht 6\' 4"  (1.93 m)   Wt 217 lb (98.4 kg)   BMI 26.41 kg/m  .  BMI Body mass index is 26.41 kg/m.  Wt Readings from Last 3 Encounters:  11/27/15  217 lb (98.4 kg)  05/30/15 220 lb 1.9 oz (99.8 kg)  01/16/15 226 lb 12.8 oz (102.9 kg)    General: He can be a little loud. But he is alert and in no acute distress.   HEENT: Normal.  Neck: Supple, no JVD, carotid bruits, or masses noted.  Cardiac: Irregular irregular rhythm. His rate is ok. No murmurs, rubs, or gallops. No edema.  Respiratory:  Lungs are clear to auscultation bilaterally with normal work of breathing.  GI: Soft and nontender.  MS: No deformity or atrophy. Gait and ROM intact.  Skin: Warm and dry. Color is normal.  Neuro:  Strength and sensation are intact and no gross focal deficits noted.  Psych: Alert, appropriate and with normal affect.   LABORATORY DATA:  EKG:  EKG is not ordered today.  Lab Results  Component Value Date   WBC 6.9 11/23/2014   HGB 14.0 11/23/2014   HCT 42.7 11/23/2014   PLT 178 11/23/2014   GLUCOSE 131 (H) 05/30/2015   CHOL 169 05/30/2015   TRIG 149 05/30/2015   HDL 62 05/30/2015   LDLCALC 77 05/30/2015   ALT 12 05/30/2015   AST 14 05/30/2015   NA 140 05/30/2015   K 4.2 05/30/2015   CL 104 05/30/2015   CREATININE 1.22 05/30/2015   BUN 15 05/30/2015   CO2 27 05/30/2015   TSH 1.46 08/23/2014   INR 2.20 (H) 09/19/2011   HGBA1C 6.1 10/12/2014    BNP (last 3 results) No results for input(s): BNP in the last 8760 hours.  ProBNP (last 3 results) No results for input(s): PROBNP in the last  8760 hours.   Other Studies Reviewed Today:  Echo Study Conclusions from 08/17/2014  - Left ventricle: The cavity size was mildly dilated. Wall thickness was normal. Systolic function was mildly reduced. The estimated ejection fraction was in the range of 45% to 50%. Hypokinesis of the basal-midinferolateral myocardium. - Mitral valve: There was mild to moderate regurgitation directed centrally. - Left atrium: The atrium was severely dilated. - Right atrium: The atrium was mildly dilated. - Pulmonary arteries: Systolic pressure was mildly to moderately increased. PA peak pressure: 48 mm Hg (S).  Cardiac Cath 09/09/2011:  Coronary angiography:  Left mainstem: Widely patent with minor luminal irregularity.  Left anterior descending (LAD): Patent to the left ventricular apex. The first diagonal branch is patent. There is mild proximal LAD calcification and also mild luminal irregularity but there are no significant stenoses throughout the course of the LAD distribution.  Left circumflex (LCx): The left circumflex is a large-caliber vessel. The proximal and mid circumflex have mild diffuse irregularity. The first OM is patent but in the midportion of this vessel there is 50-75% stenosis. There is also a subbranch that has tight ostial stenosis but this is a very small vessel. The percent stenosis is estimated at 80-90. The continuation of the AV groove circumflex supplies a posterolateral branch which also has 80-90% stenosis. This is a very small vessel less than 1.5 mm in caliber.  Right coronary artery (RCA): The right coronary artery is dominant. There is mild luminal irregularity in the proximal vessel. The mid vessel has 40-50% stenosis. The distal vessel has luminal irregularity. The PDA and posterolateral branches are patent  Left ventriculography: The left ventricle is poorly visualized with ventriculography, despite a 30 cc injection. However, there is  clearly global left ventricular dysfunction and I would estimate the ejection fraction in the range of 30%.  Final Conclusions:  1.  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 Nonobstructive LAD and right coronary artery stenoses 3. Severe global left ventricular dysfunction  Recommendations: Medical therapy. Plan will have to be made regarding anticoagulation in this patient with atrial fibrillation and cardiomyopathy, but this is complicated by concerns about medicine adherence and alcohol use.   ASSESSMENT AND PLAN: 1. CAD, native vessel: no angina. Continue current Rx's.   2. Chronic atrial fibrillation with chronic RVR. He is unwilling to take anticoagulant drugs because of past bleeding experiences on warfarin and DOAC medications. His rate has previously been more controlled with the increase in beta blocker. I have replaced his Norvasc with Diltiazem 240 mg in the past.  Echo with signficant LAE - making restoration of NSR unlikely. He now has better rate control. He is basically asymptomatic and seems to be holding his own for now - no change in his current regimen.   3. Cardiomyopathy: multifactorial with AF, malignant HTN, and CAD. Medications as outlined above. He is on a pretty good regimen. Weight is down. I have left him on his current regimen. He is doing better with sodium restriction.   4. HTN: I have left him on his current regimen  5. HLD - will try to get a copy of his labs from his new PCP  Current medicines are reviewed with the patient today.  The patient does not have concerns regarding medicines other than what has been noted above.  The following changes have been made:  See above.  Labs/ tests ordered today include:   No orders of the defined types were placed in this encounter.    Disposition:   FU with Dr. Excell Seltzer or me in 6 months.   Patient is agreeable to this plan and will  call if any problems develop in the interim.   Signed: Rosalio Macadamia, RN, ANP-C 11/27/2015 11:50 AM  [facility] [address] [place], [place]  [address] Phone: [phone] Fax: [phone]

## 2015-11-27 NOTE — Patient Instructions (Addendum)
We will be checking the following labs today - NONE  We will call your PCP office to try and get a copy of your labs.     Medication Instructions:    Continue with your current medicines.     Testing/Procedures To Be Arranged:  N/A  Follow-Up:   See me or Dr. Excell Seltzerooper in 6 months    Other Special Instructions:   N/A    If you need a refill on your cardiac medications before your next appointment, please call your pharmacy.   Call the [facility] office at [phone] if you have any questions, problems or concerns.

## 2015-11-28 ENCOUNTER — Other Ambulatory Visit: Payer: Self-pay | Admitting: Nurse Practitioner

## 2015-12-10 ENCOUNTER — Other Ambulatory Visit: Payer: Self-pay | Admitting: Cardiovascular Disease

## 2015-12-10 DIAGNOSIS — I255 Ischemic cardiomyopathy: Secondary | ICD-10-CM

## 2015-12-10 DIAGNOSIS — I10 Essential (primary) hypertension: Secondary | ICD-10-CM

## 2015-12-10 DIAGNOSIS — E785 Hyperlipidemia, unspecified: Secondary | ICD-10-CM

## 2015-12-10 DIAGNOSIS — I4891 Unspecified atrial fibrillation: Secondary | ICD-10-CM

## 2016-01-16 ENCOUNTER — Telehealth: Payer: Self-pay | Admitting: Cardiovascular Disease

## 2016-01-16 NOTE — Telephone Encounter (Signed)
I advised patient to call pharmacy before going out to be sure it was ready. He voiced understanding and thanks.

## 2016-01-16 NOTE — Telephone Encounter (Signed)
I called pharmacy and advised ok to fill.  Pharmacist aware and states they will fill.  I tried to contact patient and let him know.  LMTCB

## 2016-01-16 NOTE — Telephone Encounter (Signed)
New Message  Pt c/o medication issue:  1. Name of Medication: Colchicine.... Cartia   2. How are you currently taking this medication (dosage and times per day)? .0.6mg ..... 240mg    3. Are you having a reaction (difficulty breathing--STAT)? No  4. What is your medication issue? Per pt states the pharmacy will not refill Colchicine because the state it counter acts with the Cartia. Pt stats he was told he would need a new bp medication. Or a new medication for his gout. Please call back to discuss

## 2016-01-16 NOTE — Telephone Encounter (Signed)
Please advise pharmacy that it's fine for pt to be on both of these medications; the colchicine is just prn for gout flares and drug interaction is not severe enough to warrant changing Cartia to a different medication.

## 2016-01-16 NOTE — Telephone Encounter (Signed)
Pt cb.  I advised him of the attached message and rx should be ready soon.

## 2016-01-16 NOTE — Telephone Encounter (Signed)
Please advise 

## 2016-01-21 ENCOUNTER — Telehealth: Payer: Self-pay | Admitting: Cardiovascular Disease

## 2016-01-21 NOTE — Telephone Encounter (Signed)
LM to cb

## 2016-01-21 NOTE — Telephone Encounter (Signed)
919am [facility]pls call [phone]

## 2016-01-21 NOTE — Telephone Encounter (Signed)
Spoke with pt who was confused about whether it is OK to take both Cartia and prn colchicine.  Advised OK to take as long as colchicine is only used prn.  He would like for this office to call in an RX.  Advised this office does not RX medications for gout and he will need to contact his PCP.  He states understanding.

## 2016-01-21 NOTE — Telephone Encounter (Signed)
Patient has a question about his medication and possible adverse interactions.  Please call patient back to advise.

## 2016-01-29 ENCOUNTER — Telehealth: Payer: Self-pay | Admitting: Nurse Practitioner

## 2016-01-29 NOTE — Telephone Encounter (Signed)
Follow Up   Timothy Olsen is calling to say that he will switch to a new medication and he does have the allergic reaction to the Lisinopril . Will call [name] at [facility] to let her know that he wants to change the medication . Please call if you have any questions . Thanks

## 2016-01-29 NOTE — Telephone Encounter (Signed)
Pt will continue Lisinopril and if pt wants to switch to a different medication that does the same thing. Pt will call back.

## 2016-01-29 NOTE — Telephone Encounter (Signed)
New message    Pt verbalized that it is flu and cold season and that he dont want his lisinopril to stop. [name] NP at [facility] told pt that because of his dry cough, he should stop taking his medication. He states that he wants to keep taking it and he dont want to start something new.

## 2016-01-30 NOTE — Telephone Encounter (Signed)
Follow up       [name], NP at [facility] want to know if it is ok to switch pt from lisinopril 40mg  to losartan 100mg  due to a persistant cough.  Please call

## 2016-01-30 NOTE — Telephone Encounter (Signed)
Tried to call [facility] @ [phone] to let [name], NP that Norma FredricksonLori Gerhardt, NP, stated pt can change to Losartan (100 mg ) daily.  Office is closed.  Out of office tomorrow will route to Karsten FellsKaty Kemp, Charity fundraiserN. Orpha BurKaty is aware.

## 2016-01-31 NOTE — Telephone Encounter (Signed)
Left message on [name], NP's CMA's VM that the patient may change to Losartan 100 mg daily. Instructed her to call if she has any further questions or concerns.

## 2016-03-19 ENCOUNTER — Encounter (HOSPITAL_COMMUNITY): Payer: Self-pay | Admitting: Emergency Medicine

## 2016-03-19 ENCOUNTER — Emergency Department (HOSPITAL_COMMUNITY): Payer: Medicare Other

## 2016-03-19 ENCOUNTER — Emergency Department (HOSPITAL_COMMUNITY)
Admission: EM | Admit: 2016-03-19 | Discharge: 2016-03-20 | Disposition: A | Discharge status: Home / Self Care | Payer: Medicare Other | Attending: Physician Assistant | Admitting: Physician Assistant

## 2016-03-19 DIAGNOSIS — Z7984 Long term (current) use of oral hypoglycemic drugs: Secondary | ICD-10-CM | POA: Insufficient documentation

## 2016-03-19 DIAGNOSIS — I252 Old myocardial infarction: Secondary | ICD-10-CM | POA: Diagnosis not present

## 2016-03-19 DIAGNOSIS — Z79899 Other long term (current) drug therapy: Secondary | ICD-10-CM | POA: Diagnosis not present

## 2016-03-19 DIAGNOSIS — Z7982 Long term (current) use of aspirin: Secondary | ICD-10-CM | POA: Diagnosis not present

## 2016-03-19 DIAGNOSIS — E1140 Type 2 diabetes mellitus with diabetic neuropathy, unspecified: Secondary | ICD-10-CM | POA: Diagnosis not present

## 2016-03-19 DIAGNOSIS — I2510 Atherosclerotic heart disease of native coronary artery without angina pectoris: Secondary | ICD-10-CM | POA: Diagnosis not present

## 2016-03-19 DIAGNOSIS — I110 Hypertensive heart disease with heart failure: Secondary | ICD-10-CM | POA: Insufficient documentation

## 2016-03-19 DIAGNOSIS — Y9389 Activity, other specified: Secondary | ICD-10-CM | POA: Insufficient documentation

## 2016-03-19 DIAGNOSIS — M79602 Pain in left arm: Secondary | ICD-10-CM | POA: Insufficient documentation

## 2016-03-19 DIAGNOSIS — Y999 Unspecified external cause status: Secondary | ICD-10-CM | POA: Diagnosis not present

## 2016-03-19 DIAGNOSIS — Y929 Unspecified place or not applicable: Secondary | ICD-10-CM | POA: Diagnosis not present

## 2016-03-19 DIAGNOSIS — Z87891 Personal history of nicotine dependence: Secondary | ICD-10-CM | POA: Insufficient documentation

## 2016-03-19 DIAGNOSIS — W19XXXA Unspecified fall, initial encounter: Secondary | ICD-10-CM

## 2016-03-19 DIAGNOSIS — W1839XA Other fall on same level, initial encounter: Secondary | ICD-10-CM | POA: Insufficient documentation

## 2016-03-19 DIAGNOSIS — I5042 Chronic combined systolic (congestive) and diastolic (congestive) heart failure: Secondary | ICD-10-CM | POA: Insufficient documentation

## 2016-03-19 MED ORDER — OXYCODONE-ACETAMINOPHEN 5-325 MG PO TABS: 1.0000 | ORAL_TABLET | Freq: Four times a day (QID) | ORAL | 0 refills | Status: AC | PRN
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
## 2016-03-19 NOTE — ED Provider Notes (Addendum)
[facility] DEPT Provider Note   CSN: [id number] Arrival date & time: 03/19/16  [id number]     History   Chief Complaint Chief Complaint  Patient presents with  . Fall    HPI Pietro Cassisnthony L Diclemente is a 61 y.o. male.  HPI   Patient is 61 year old male presenting after fall. Patient reports that he is backing up off a ramp and fell. He states he hurt his left arm and all over his back. He says that his back pain feels like nerve pain. It is not tender in any one point but rather all over. Patient doesn't strike head. No loss of consciousness.  Past Medical History:  Diagnosis Date  . Alcohol abuse   . Atrial fibrillation (HCC)    CHADS2=3; Xarelto started 7/13; s/p TEE-DCCV => NSR;  patient back in AF with RVR at f/u 8/13 => amiodarone started => back in NSR 9/13 f/u OV  . CAD (coronary artery disease)    LHC 7/13: mOM1 50-75%, ostial subbranch 80-90% (very small), PL branch 80-90% (very small), mRCA 40-50%, EF 30% => med Rx for small vessel disease  . Chronic combined systolic and diastolic CHF (congestive heart failure) (HCC)   . Diabetes mellitus   . Epistaxis 08/2011   On heparin and Coumadin s/p silver nitrate cauterization L nare  . Gout   . Hyperlipidemia   . Hypertension   . Ischemic cardiomyopathy    echo 7/13: mild LVH, EF 25-30%, global HK with severe Hk to AK of basal to mid inf and post walls, severe diast dysfxn, mild MR, mod LAE, mod reduced RV fxn, mod RAE, PASP 50 (mild to mod pulmo HTN);  TEE 7/13 with EF 30-35%  . MI (myocardial infarction)   . Tobacco abuse     Patient Active Problem List   Diagnosis Date Noted  . PVD (peripheral vascular disease) (HCC) 02/16/2012  . Peripheral neuropathy (HCC) 02/16/2012  . CAD in native artery 11/26/2011  . Edema of lower extremity 11/26/2011  . Epistaxis 09/16/2011  . Acute on chronic combined systolic and diastolic CHF (congestive heart failure) (HCC) 09/16/2011  . Atrial fibrillation with rapid ventricular response  (HCC) 09/12/2011  . Non-ST elevation myocardial infarction (NSTEMI), initial episode of care (HCC) 09/12/2011  . Ischemic cardiomyopathy 09/12/2011  . Joint swelling 01/23/2011  . Chest pain 01/21/2011  . Diabetes mellitus (HCC) 01/21/2011  . Hypertension 01/21/2011  . Hyperlipidemia 01/21/2011    Past Surgical History:  Procedure Laterality Date  . ADENOIDECTOMY    . CARDIAC CATHETERIZATION  09/09/2011   a) widely patent left main with minor luminal irregularities, patent LAD, mild prox LAD calcification and luminal irregularities, patent D1, diffuse prox and LCX- mid irregularity, 50-75% mid OM1 stenosis, 80-90% ostial stenosis in small subbranch, small PLB 80-90% stenosis, RCA- mild proximal luminal irregularity, mid 40-50% stenosis, distal irregularity, PDA and PLB patent; LVEF 30% and global L  . CARDIOVERSION  09/15/2011   Procedure: CARDIOVERSION;  Surgeon: Lewayne BuntingBrian S Crenshaw, MD;  [facility];  Service: Cardiovascular;  Laterality: N/A;  . LEFT HEART CATHETERIZATION WITH CORONARY ANGIOGRAM N/A 09/09/2011   Procedure: LEFT HEART CATHETERIZATION WITH CORONARY ANGIOGRAM;  Surgeon: Tonny BollmanMichael Cooper, MD;  [facility];  Service: Cardiovascular;  Laterality: N/A;  . left thumb surgery    . TEE WITHOUT CARDIOVERSION  09/15/2011   Procedure: TRANSESOPHAGEAL ECHOCARDIOGRAM (TEE);  Surgeon: Lewayne BuntingBrian S Crenshaw, MD;  [facility];  Service: Cardiovascular;  Laterality: N/A;  . TONSILLECTOMY  Home Medications    Prior to Admission medications   Medication Sig Start Date End Date Taking? Authorizing Provider  allopurinol (ZYLOPRIM) 100 MG tablet Take 100 mg by mouth as needed (for gout flare up).    Historical Provider, MD  aspirin EC 81 MG tablet Take 81 mg by mouth 2 (two) times a week.  07/06/12   Tonny Bollman, MD  CARTIA XT 240 MG 24 hr capsule TAKE ONE CAPSULE BY MOUTH DAILY 11/29/15   Rosalio Macadamia, NP  colchicine 0.6 MG tablet Take 0.6 mg by mouth as  needed (2 tabs at first sign or gout flare up than 1 tab an hour later).    Historical Provider, MD  furosemide (LASIX) 40 MG tablet Is taking as needed 08/20/15   Tonny Bollman, MD  glipiZIDE (GLUCOTROL) 5 MG tablet Take 5 mg by mouth 2 (two) times daily before a meal.      Historical Provider, MD  hydrALAZINE (APRESOLINE) 50 MG tablet TAKE ONE TABLET BY MOUTH THREE TIMES DAILY 05/31/15   Rosalio Macadamia, NP  HYDROcodone-acetaminophen (NORCO/VICODIN) 5-325 MG per tablet Take 1 tablet by mouth every 6 (six) hours as needed for moderate pain. 10/12/14   Sherren Mocha, MD  indomethacin (INDOCIN) 25 MG capsule Take 25 mg by mouth as needed (gout flare up).    Historical Provider, MD  lisinopril (PRINIVIL,ZESTRIL) 40 MG tablet TAKE ONE TABLET BY MOUTH ONCE DAILY 08/29/15   Tonny Bollman, MD  metFORMIN (GLUCOPHAGE) 1000 MG tablet Take 1,000 mg by mouth as directed. Sliding Scale 06/06/14   Historical Provider, MD  metoprolol (LOPRESSOR) 50 MG tablet TAKE ONE TABLET BY MOUTH TWICE DAILY; MAY TAKE EXTRA 1/2 TABLET (TOTAL OF 75 MG) BID IF HR GREATER THAN 100. 08/20/15   Rosalio Macadamia, NP  metoprolol (LOPRESSOR) 50 MG tablet TAKE ONE TABLET BY MOUTH TWICE DAILY 12/10/15   Tonny Bollman, MD  nitroGLYCERIN (NITROSTAT) 0.4 MG SL tablet Place 1 tablet (0.4 mg total) under the tongue every 5 (five) minutes as needed. For chest pain 10/16/14   Sherren Mocha, MD  potassium chloride SA (K-DUR,KLOR-CON) 20 MEQ tablet Take 1 tablet (20 mEq total) by mouth daily. With lasix only 10/16/14   Sherren Mocha, MD  pravastatin (PRAVACHOL) 40 MG tablet Take 40 mg by mouth daily.  05/20/15   Historical Provider, MD    Family History Family History  Problem Relation Age of Onset  . Diabetes type II Mother   . Hypertension Father     Social History Social History  Substance Use Topics  . Smoking status: Former Smoker    Packs/day: 0.50    Years: 2.00    Types: Cigarettes  . Smokeless tobacco: Never Used  . Alcohol use 3.0 oz/week     6 drink(s) per week     Comment: rarely     Allergies   Patient has no known allergies.   Review of Systems Review of Systems  Constitutional: Negative for activity change.  Respiratory: Negative for shortness of breath.   Cardiovascular: Negative for chest pain.  Gastrointestinal: Negative for abdominal pain.  All other systems reviewed and are negative.    Physical Exam Updated Vital Signs BP (!) 134/110 (BP Location: Left Arm)   Pulse 75   Temp 97.7 F (36.5 C) (Oral)   Resp 16   Ht 6\' 4"  (1.93 m)   Wt 225 lb (102.1 kg)   SpO2 99%   BMI 27.39 kg/m   Physical  Exam  Constitutional: He is oriented to person, place, and time. He appears well-nourished.  HENT:  Head: Normocephalic.  Eyes: Conjunctivae are normal.  Cardiovascular: Normal rate.   No murmur heard. Pulmonary/Chest: Effort normal and breath sounds normal. No respiratory distress. He has no wheezes.  Musculoskeletal:  Patient has tenderness in left arm. However no overlying bruising. Patient has no point tenderness on his back.  L distal pulses and sensation and motor intact.  Neurological: He is oriented to person, place, and time. No cranial nerve deficit. Coordination normal.  Skin: Skin is warm and dry. He is not diaphoretic.  No evidence of abrasions or bruising.  Psychiatric: He has a normal mood and affect. His behavior is normal.     ED Treatments / Results  Labs (all labs ordered are listed, but only abnormal results are displayed) Labs Reviewed - No data to display  EKG  EKG Interpretation None       Radiology Dg Shoulder Left  Result Date: 03/19/2016 CLINICAL DATA:  Left shoulder pain after fall. EXAM: LEFT SHOULDER - 2+ VIEW COMPARISON:  None. FINDINGS: Nondisplaced humeral head fracture superolaterally partially undermining the greater tuberosity. No malalignment of the glenohumeral or AC joints. AC joint osteoarthritis with undersurface spurring is noted. IMPRESSION: Neer  category 1 part fracture of the left proximal humerus. Electronically Signed   By: Tollie Eth M.D.   On: 03/19/2016 21:20    Procedures Procedures (including critical care time)  Medications Ordered in ED Medications - No data to display   Initial Impression / Assessment and Plan / ED Course  I have reviewed the triage vital signs and the nursing notes.  Pertinent labs & imaging results that were available during my care of the patient were reviewed by me and considered in my medical decision making (see chart for details).     Patient here after mechanical fall. Patient denies of left shoulder pain and back pain. No external signs of trauma, no bruising no ecchymosis or abrasions. X-ray shows proximal humeral fracture. We'll put in sling. We'll get plain films of his back and chest. Pt explicity said he feels he might have a rib fracture because of the fall. Patient has no neurologic symptoms at this time is able to move both legs normally no decreased sensation, normal strength. No lack of bowel or bladder continence. No red flags. We'll have him follow-up with his primary care physician as needed.  11:43 PM Xrays show the rib fracture.   Again, no evidence of bruising or echymosis. Will give very short Rx of pain medicaitons and follow up with PCP.    Final Clinical Impressions(s) / ED Diagnoses   Final diagnoses:  None    New Prescriptions New Prescriptions   No medications on file     Courteney Randall An, MD 03/19/16 2343    Courteney Randall An, MD 03/19/16 2345

## 2016-03-20 NOTE — ED Notes (Signed)
Pt wants monitoring devices removed Pt refusing shoulder immobilizer to be placed at this time. Pt requesting to take his own BP medication

## 2016-03-31 ENCOUNTER — Inpatient Hospital Stay (HOSPITAL_COMMUNITY)
Admission: EM | Admit: 2016-03-31 | Discharge: 2016-04-10 | DRG: 292 | Disposition: E | Payer: Medicare Other | Attending: Internal Medicine | Admitting: Internal Medicine

## 2016-03-31 ENCOUNTER — Emergency Department (HOSPITAL_COMMUNITY): Payer: Medicare Other

## 2016-03-31 ENCOUNTER — Encounter (HOSPITAL_COMMUNITY): Payer: Self-pay | Admitting: Emergency Medicine

## 2016-03-31 DIAGNOSIS — M79674 Pain in right toe(s): Secondary | ICD-10-CM

## 2016-03-31 DIAGNOSIS — I959 Hypotension, unspecified: Secondary | ICD-10-CM | POA: Diagnosis not present

## 2016-03-31 DIAGNOSIS — I10 Essential (primary) hypertension: Secondary | ICD-10-CM | POA: Diagnosis present

## 2016-03-31 DIAGNOSIS — I110 Hypertensive heart disease with heart failure: Principal | ICD-10-CM | POA: Diagnosis present

## 2016-03-31 DIAGNOSIS — I5082 Biventricular heart failure: Secondary | ICD-10-CM | POA: Diagnosis present

## 2016-03-31 DIAGNOSIS — Z7982 Long term (current) use of aspirin: Secondary | ICD-10-CM

## 2016-03-31 DIAGNOSIS — M109 Gout, unspecified: Secondary | ICD-10-CM | POA: Diagnosis present

## 2016-03-31 DIAGNOSIS — E785 Hyperlipidemia, unspecified: Secondary | ICD-10-CM | POA: Diagnosis present

## 2016-03-31 DIAGNOSIS — I739 Peripheral vascular disease, unspecified: Secondary | ICD-10-CM | POA: Diagnosis present

## 2016-03-31 DIAGNOSIS — T461X5A Adverse effect of calcium-channel blockers, initial encounter: Secondary | ICD-10-CM | POA: Diagnosis not present

## 2016-03-31 DIAGNOSIS — Z8249 Family history of ischemic heart disease and other diseases of the circulatory system: Secondary | ICD-10-CM

## 2016-03-31 DIAGNOSIS — R570 Cardiogenic shock: Secondary | ICD-10-CM | POA: Diagnosis not present

## 2016-03-31 DIAGNOSIS — Y92230 Patient room in hospital as the place of occurrence of the external cause: Secondary | ICD-10-CM | POA: Diagnosis not present

## 2016-03-31 DIAGNOSIS — Z9119 Patient's noncompliance with other medical treatment and regimen: Secondary | ICD-10-CM

## 2016-03-31 DIAGNOSIS — I5043 Acute on chronic combined systolic (congestive) and diastolic (congestive) heart failure: Secondary | ICD-10-CM | POA: Diagnosis present

## 2016-03-31 DIAGNOSIS — I481 Persistent atrial fibrillation: Secondary | ICD-10-CM | POA: Diagnosis present

## 2016-03-31 DIAGNOSIS — S91109A Unspecified open wound of unspecified toe(s) without damage to nail, initial encounter: Secondary | ICD-10-CM | POA: Diagnosis present

## 2016-03-31 DIAGNOSIS — E1151 Type 2 diabetes mellitus with diabetic peripheral angiopathy without gangrene: Secondary | ICD-10-CM | POA: Diagnosis present

## 2016-03-31 DIAGNOSIS — Z87891 Personal history of nicotine dependence: Secondary | ICD-10-CM

## 2016-03-31 DIAGNOSIS — Z79899 Other long term (current) drug therapy: Secondary | ICD-10-CM

## 2016-03-31 DIAGNOSIS — N179 Acute kidney failure, unspecified: Secondary | ICD-10-CM | POA: Diagnosis present

## 2016-03-31 DIAGNOSIS — I2510 Atherosclerotic heart disease of native coronary artery without angina pectoris: Secondary | ICD-10-CM | POA: Diagnosis present

## 2016-03-31 DIAGNOSIS — E872 Acidosis: Secondary | ICD-10-CM | POA: Diagnosis not present

## 2016-03-31 DIAGNOSIS — Z833 Family history of diabetes mellitus: Secondary | ICD-10-CM

## 2016-03-31 DIAGNOSIS — Z66 Do not resuscitate: Secondary | ICD-10-CM | POA: Diagnosis not present

## 2016-03-31 DIAGNOSIS — E11649 Type 2 diabetes mellitus with hypoglycemia without coma: Secondary | ICD-10-CM | POA: Diagnosis present

## 2016-03-31 DIAGNOSIS — I252 Old myocardial infarction: Secondary | ICD-10-CM

## 2016-03-31 DIAGNOSIS — M7989 Other specified soft tissue disorders: Secondary | ICD-10-CM

## 2016-03-31 DIAGNOSIS — I509 Heart failure, unspecified: Secondary | ICD-10-CM

## 2016-03-31 DIAGNOSIS — F1010 Alcohol abuse, uncomplicated: Secondary | ICD-10-CM | POA: Diagnosis present

## 2016-03-31 DIAGNOSIS — R600 Localized edema: Secondary | ICD-10-CM | POA: Diagnosis present

## 2016-03-31 DIAGNOSIS — J9601 Acute respiratory failure with hypoxia: Secondary | ICD-10-CM

## 2016-03-31 DIAGNOSIS — S42209D Unspecified fracture of upper end of unspecified humerus, subsequent encounter for fracture with routine healing: Secondary | ICD-10-CM

## 2016-03-31 DIAGNOSIS — Z7984 Long term (current) use of oral hypoglycemic drugs: Secondary | ICD-10-CM

## 2016-03-31 DIAGNOSIS — I4891 Unspecified atrial fibrillation: Secondary | ICD-10-CM

## 2016-03-31 DIAGNOSIS — I255 Ischemic cardiomyopathy: Secondary | ICD-10-CM | POA: Diagnosis present

## 2016-03-31 DIAGNOSIS — R531 Weakness: Secondary | ICD-10-CM | POA: Diagnosis not present

## 2016-03-31 DIAGNOSIS — E119 Type 2 diabetes mellitus without complications: Secondary | ICD-10-CM

## 2016-03-31 LAB — CBC WITH DIFFERENTIAL/PLATELET
BASOS PCT: 0 %
Basophils Absolute: 0 10*3/uL (ref 0.0–0.1)
EOS PCT: 0 %
Eosinophils Absolute: 0 10*3/uL (ref 0.0–0.7)
HEMATOCRIT: 45.8 % (ref 39.0–52.0)
HEMOGLOBIN: 14.9 g/dL (ref 13.0–17.0)
Lymphocytes Relative: 6 %
Lymphs Abs: 0.4 10*3/uL — ABNORMAL LOW (ref 0.7–4.0)
MCH: 28.5 pg (ref 26.0–34.0)
MCHC: 32.5 g/dL (ref 30.0–36.0)
MCV: 87.6 fL (ref 78.0–100.0)
MONO ABS: 0.3 10*3/uL (ref 0.1–1.0)
MONOS PCT: 4 %
NEUTROS PCT: 90 %
Neutro Abs: 6 10*3/uL (ref 1.7–7.7)
Platelets: 154 10*3/uL (ref 150–400)
RBC: 5.23 MIL/uL (ref 4.22–5.81)
RDW: 17.4 % — AB (ref 11.5–15.5)
WBC MORPHOLOGY: INCREASED
WBC: 6.7 10*3/uL (ref 4.0–10.5)

## 2016-03-31 LAB — URINALYSIS, ROUTINE W REFLEX MICROSCOPIC
GLUCOSE, UA: NEGATIVE mg/dL
Hgb urine dipstick: NEGATIVE
KETONES UR: NEGATIVE mg/dL
Leukocytes, UA: NEGATIVE
Nitrite: NEGATIVE
PH: 5 (ref 5.0–8.0)
PROTEIN: 100 mg/dL — AB
Specific Gravity, Urine: 1.035 — ABNORMAL HIGH (ref 1.005–1.030)

## 2016-03-31 LAB — I-STAT TROPONIN, ED: TROPONIN I, POC: 0.05 ng/mL (ref 0.00–0.08)

## 2016-03-31 LAB — COMPREHENSIVE METABOLIC PANEL
ALBUMIN: 2.8 g/dL — AB (ref 3.5–5.0)
ALK PHOS: 108 U/L (ref 38–126)
ALT: 15 U/L — AB (ref 17–63)
AST: 23 U/L (ref 15–41)
Anion gap: 12 (ref 5–15)
BILIRUBIN TOTAL: 4 mg/dL — AB (ref 0.3–1.2)
BUN: 15 mg/dL (ref 6–20)
CALCIUM: 9 mg/dL (ref 8.9–10.3)
CO2: 20 mmol/L — ABNORMAL LOW (ref 22–32)
CREATININE: 1.12 mg/dL (ref 0.61–1.24)
Chloride: 106 mmol/L (ref 101–111)
GFR calc Af Amer: >60 mL/min (ref >60)
GLUCOSE: 98 mg/dL (ref 65–99)
Potassium: 4.2 mmol/L (ref 3.5–5.1)
Sodium: 138 mmol/L (ref 135–145)
TOTAL PROTEIN: 5.5 g/dL — AB (ref 6.5–8.1)

## 2016-03-31 LAB — BRAIN NATRIURETIC PEPTIDE: B Natriuretic Peptide: 1089.5 pg/mL — ABNORMAL HIGH (ref 0.0–100.0)

## 2016-03-31 LAB — LIPASE, BLOOD: Lipase: 13 U/L (ref 11–51)

## 2016-03-31 MED ORDER — SODIUM CHLORIDE 0.9 % IV BOLUS (SEPSIS)
500.0000 mL | Freq: Once | INTRAVENOUS | Status: AC
  Administered 2016-03-31: 500 mL via INTRAVENOUS

## 2016-03-31 MED ORDER — AMIODARONE HCL IN DEXTROSE 360-4.14 MG/200ML-% IV SOLN
60.0000 mg/h | INTRAVENOUS | Status: AC
  Administered 2016-04-01 (×2): 30 mg/h via INTRAVENOUS
  Administered 2016-04-02 (×2): 60 mg/h via INTRAVENOUS
  Filled 2016-03-31 (×4): qty 200

## 2016-03-31 MED ORDER — DILTIAZEM LOAD VIA INFUSION: 15.0000 mg | Freq: Once | INTRAVENOUS | Status: DC

## 2016-03-31 MED ORDER — AMIODARONE LOAD VIA INFUSION
150.0000 mg | Freq: Once | INTRAVENOUS | Status: AC
  Administered 2016-03-31: 150 mg via INTRAVENOUS
  Filled 2016-03-31: qty 83.34

## 2016-03-31 MED ORDER — DILTIAZEM LOAD VIA INFUSION
10.0000 mg | Freq: Once | INTRAVENOUS | Status: DC
  Filled 2016-03-31: qty 10

## 2016-03-31 MED ORDER — AMIODARONE HCL IN DEXTROSE 360-4.14 MG/200ML-% IV SOLN
60.0000 mg/h | INTRAVENOUS | Status: AC
  Administered 2016-03-31 – 2016-04-01 (×2): 60 mg/h via INTRAVENOUS
  Filled 2016-03-31 (×2): qty 200

## 2016-03-31 MED ORDER — DILTIAZEM HCL 100 MG IV SOLR
5.0000 mg/h | INTRAVENOUS | Status: DC
  Administered 2016-03-31: 5 mg/h via INTRAVENOUS
  Filled 2016-03-31: qty 100

## 2016-03-31 MED ORDER — FUROSEMIDE 10 MG/ML IJ SOLN: 40.0000 mg | Freq: Once | INTRAMUSCULAR | Status: DC

## 2016-03-31 MED ORDER — DILTIAZEM HCL 100 MG IV SOLR: 5.0000 mg/h | INTRAVENOUS | Status: DC

## 2016-03-31 NOTE — ED Notes (Addendum)
Spoke w/ EDP about pt bp. EDP stated to hold loading dose of Cardizem and just give continuous dose after fluid bolus.

## 2016-03-31 NOTE — ED Triage Notes (Addendum)
Pt st's he fell on 2/7 and was seen here for same.  Pt st's he has been hurting all over since then.  Pt also c/o abd. Pain.  No nausea or vomiting.  Pt also c/o bil leg pain and left lower leg weeping

## 2016-03-31 NOTE — ED Notes (Signed)
Pt insisted on taking his home meds once in room, stating that "I will die if I wait on y'all to give me my medicine." He took 1 Metoprolol & 1 Dialtiazem stating his HR is in the "150s, I took it at home before I came here."

## 2016-03-31 NOTE — ED Provider Notes (Addendum)
[facility] Provider Note   CSN: [id number] Arrival date & time: 03/18/2016  1738     History   Chief Complaint Chief Complaint  Patient presents with  . Generalized Body Aches    HPI Timothy Olsen is a 61 y.o. male.  HPI Patient presents to the emergency room for evaluation of generalized body aches, abdominal discomfort and weakness.   Patient states he fell on February 7. He was evaluated at that time. He was diagnosed proximal humerus fracture. Patient has been walking around but over the last few days he started having increasing soreness all over. Does have abdominal fullness and discomfort in his upper abdomen. He denies any trouble with nausea or vomiting. Patient also is complaining of severe leg swelling and weeping. Does have a history of atrial fibrillation and congestive heart failure. He states he has been taking his medications.  History is somewhat difficult to obtain. The patient speaks very slowly. He takes a long time to tell me about various medications he has been taking but does not specifically answer some questions that I ask. Past Medical History:  Diagnosis Date  . Alcohol abuse   . Atrial fibrillation (HCC)    CHADS2=3; Xarelto started 7/13; s/p TEE-DCCV => NSR;  patient back in AF with RVR at f/u 8/13 => amiodarone started => back in NSR 9/13 f/u OV  . CAD (coronary artery disease)    LHC 7/13: mOM1 50-75%, ostial subbranch 80-90% (very small), PL branch 80-90% (very small), mRCA 40-50%, EF 30% => med Rx for small vessel disease  . Chronic combined systolic and diastolic CHF (congestive heart failure) (HCC)   . Diabetes mellitus   . Epistaxis 08/2011   On heparin and Coumadin s/p silver nitrate cauterization L nare  . Gout   . Hyperlipidemia   . Hypertension   . Ischemic cardiomyopathy    echo 7/13: mild LVH, EF 25-30%, global HK with severe Hk to AK of basal to mid inf and post walls, severe diast dysfxn, mild MR, mod LAE, mod reduced RV fxn,  mod RAE, PASP 50 (mild to mod pulmo HTN);  TEE 7/13 with EF 30-35%  . MI (myocardial infarction)   . Tobacco abuse     Patient Active Problem List   Diagnosis Date Noted  . PVD (peripheral vascular disease) (HCC) 02/16/2012  . Peripheral neuropathy (HCC) 02/16/2012  . CAD in native artery 11/26/2011  . Edema of lower extremity 11/26/2011  . Epistaxis 09/16/2011  . Acute on chronic combined systolic and diastolic CHF (congestive heart failure) (HCC) 09/16/2011  . Atrial fibrillation with rapid ventricular response (HCC) 09/12/2011  . Non-ST elevation myocardial infarction (NSTEMI), initial episode of care (HCC) 09/12/2011  . Ischemic cardiomyopathy 09/12/2011  . Joint swelling 01/23/2011  . Chest pain 01/21/2011  . Diabetes mellitus (HCC) 01/21/2011  . Hypertension 01/21/2011  . Hyperlipidemia 01/21/2011    Past Surgical History:  Procedure Laterality Date  . ADENOIDECTOMY    . CARDIAC CATHETERIZATION  09/09/2011   a) widely patent left main with minor luminal irregularities, patent LAD, mild prox LAD calcification and luminal irregularities, patent D1, diffuse prox and LCX- mid irregularity, 50-75% mid OM1 stenosis, 80-90% ostial stenosis in small subbranch, small PLB 80-90% stenosis, RCA- mild proximal luminal irregularity, mid 40-50% stenosis, distal irregularity, PDA and PLB patent; LVEF 30% and global L  . CARDIOVERSION  09/15/2011   Procedure: CARDIOVERSION;  Surgeon: Lewayne BuntingBrian S Crenshaw, MD;  [facility];  Service: Cardiovascular;  Laterality: N/A;  .  LEFT HEART CATHETERIZATION WITH CORONARY ANGIOGRAM N/A 09/09/2011   Procedure: LEFT HEART CATHETERIZATION WITH CORONARY ANGIOGRAM;  Surgeon: Tonny Bollman, MD;  Location: [facility] CATH LAB;  Service: Cardiovascular;  Laterality: N/A;  . left thumb surgery    . TEE WITHOUT CARDIOVERSION  09/15/2011   Procedure: TRANSESOPHAGEAL ECHOCARDIOGRAM (TEE);  Surgeon: Lewayne Bunting, MD;  Location: [facility] ENDOSCOPY;  Service: Cardiovascular;   Laterality: N/A;  . TONSILLECTOMY         Home Medications    Prior to Admission medications   Medication Sig Start Date End Date Taking? Authorizing Provider  allopurinol (ZYLOPRIM) 100 MG tablet Take 100 mg by mouth as needed (for gout flare up).    Historical Provider, MD  aspirin EC 81 MG tablet Take 81 mg by mouth 2 (two) times a week.  07/06/12   Tonny Bollman, MD  CARTIA XT 240 MG 24 hr capsule TAKE ONE CAPSULE BY MOUTH DAILY 11/29/15   Rosalio Macadamia, NP  colchicine 0.6 MG tablet Take 0.6 mg by mouth as needed (2 tabs at first sign or gout flare up than 1 tab an hour later).    Historical Provider, MD  furosemide (LASIX) 40 MG tablet Is taking as needed 08/20/15   Tonny Bollman, MD  glipiZIDE (GLUCOTROL) 5 MG tablet Take 5 mg by mouth 2 (two) times daily before a meal.      Historical Provider, MD  hydrALAZINE (APRESOLINE) 50 MG tablet TAKE ONE TABLET BY MOUTH THREE TIMES DAILY 05/31/15   Rosalio Macadamia, NP  HYDROcodone-acetaminophen (NORCO/VICODIN) 5-325 MG per tablet Take 1 tablet by mouth every 6 (six) hours as needed for moderate pain. 10/12/14   Sherren Mocha, MD  indomethacin (INDOCIN) 25 MG capsule Take 25 mg by mouth as needed (gout flare up).    Historical Provider, MD  lisinopril (PRINIVIL,ZESTRIL) 40 MG tablet TAKE ONE TABLET BY MOUTH ONCE DAILY 08/29/15   Tonny Bollman, MD  metFORMIN (GLUCOPHAGE) 1000 MG tablet Take 1,000 mg by mouth as directed. Sliding Scale 06/06/14   Historical Provider, MD  metoprolol (LOPRESSOR) 50 MG tablet TAKE ONE TABLET BY MOUTH TWICE DAILY; MAY TAKE EXTRA 1/2 TABLET (TOTAL OF 75 MG) BID IF HR GREATER THAN 100. 08/20/15   Rosalio Macadamia, NP  metoprolol (LOPRESSOR) 50 MG tablet TAKE ONE TABLET BY MOUTH TWICE DAILY 12/10/15   Tonny Bollman, MD  nitroGLYCERIN (NITROSTAT) 0.4 MG SL tablet Place 1 tablet (0.4 mg total) under the tongue every 5 (five) minutes as needed. For chest pain 10/16/14   Sherren Mocha, MD  oxyCODONE-acetaminophen (PERCOCET/ROXICET)  5-325 MG tablet Take 1 tablet by mouth every 6 (six) hours as needed for severe pain. 03/19/16   Courteney Lyn Mackuen, MD  potassium chloride SA (K-DUR,KLOR-CON) 20 MEQ tablet Take 1 tablet (20 mEq total) by mouth daily. With lasix only 10/16/14   Sherren Mocha, MD  pravastatin (PRAVACHOL) 40 MG tablet Take 40 mg by mouth daily.  05/20/15   Historical Provider, MD    Family History Family History  Problem Relation Age of Onset  . Diabetes type II Mother   . Hypertension Father     Social History Social History  Substance Use Topics  . Smoking status: Former Smoker    Packs/day: 0.50    Years: 2.00    Types: Cigarettes  . Smokeless tobacco: Never Used  . Alcohol use 3.0 oz/week    6 drink(s) per week     Comment: rarely  Allergies   Patient has no known allergies.   Review of Systems Review of Systems  All other systems reviewed and are negative.    Physical Exam Updated Vital Signs BP 99/73   Pulse (!) 42   Temp 97.6 F (36.4 C) (Oral)   Resp 19   Ht 6\' 4"  (1.93 m)   Wt 102.1 kg   SpO2 100%   BMI 27.39 kg/m   Physical Exam  Constitutional: He appears well-developed. No distress.  HENT:  Head: Normocephalic and atraumatic.  Right Ear: External ear normal.  Left Ear: External ear normal.  Eyes: Conjunctivae are normal. Right eye exhibits no discharge. Left eye exhibits no discharge. No scleral icterus.  Neck: Neck supple. No tracheal deviation present.  Cardiovascular: Normal rate, regular rhythm and intact distal pulses.   Pulmonary/Chest: Effort normal and breath sounds normal. No stridor. No respiratory distress. He has no wheezes. He has no rales.  Abdominal: Soft. Bowel sounds are normal. He exhibits no distension. There is no tenderness. There is no rebound and no guarding.  Musculoskeletal: He exhibits edema. He exhibits no tenderness.  Large amount of edema, tense swelling bilateral lower extremities up to his thighs, some serous fluid draining from his  legs  Neurological: He is alert. He has normal strength. No cranial nerve deficit (no facial droop, extraocular movements intact, no slurred speech) or sensory deficit. He exhibits normal muscle tone. He displays no seizure activity. Coordination normal.  Skin: Skin is warm and dry. No rash noted. He is not diaphoretic. There is pallor.  Psychiatric: He has a normal mood and affect.  Nursing note and vitals reviewed.    ED Treatments / Results  Labs (all labs ordered are listed, but only abnormal results are displayed) Labs Reviewed  CBC WITH DIFFERENTIAL/PLATELET - Abnormal; Notable for the following:       Result Value   RDW 17.4 (*)    Lymphs Abs 0.4 (*)    All other components within normal limits  COMPREHENSIVE METABOLIC PANEL - Abnormal; Notable for the following:    CO2 20 (*)    Total Protein 5.5 (*)    Albumin 2.8 (*)    ALT 15 (*)    Total Bilirubin 4.0 (*)    All other components within normal limits  URINALYSIS, ROUTINE W REFLEX MICROSCOPIC - Abnormal; Notable for the following:    Color, Urine AMBER (*)    APPearance HAZY (*)    Specific Gravity, Urine 1.035 (*)    Bilirubin Urine MODERATE (*)    Protein, ur 100 (*)    Bacteria, UA FEW (*)    Squamous Epithelial / LPF 0-5 (*)    All other components within normal limits  BRAIN NATRIURETIC PEPTIDE - Abnormal; Notable for the following:    B Natriuretic Peptide 1,089.5 (*)    All other components within normal limits  LIPASE, BLOOD  I-STAT TROPOININ, ED    EKG  EKG Interpretation  Date/Time:  Monday March 31 2016 18:35:38 EST Ventricular Rate:  147 PR Interval:    QRS Duration: 98 QT Interval:  312 QTC Calculation: 488 R Axis:   171 Text Interpretation:  Atrial fibrillation with rapid ventricular response with premature ventricular or aberrantly conducted complexes Right axis deviation Inferior infarct , age undetermined Cannot rule out Anterior infarct , age undetermined Abnormal ECG a fib is new since  last tracing Confirmed by Charmon Thorson  MD-J, Yaslyn Cumby ([id number]) on 04/07/2016 7:06:32 PM       Radiology  Dg Chest 2 View  Result Date: 05-Apr-2016 CLINICAL DATA:  Chronic bilateral lower extremity pain and swelling. Initial encounter. EXAM: CHEST  2 VIEW COMPARISON:  Chest radiograph performed 03/19/2016 FINDINGS: The lungs are well-aerated. Mild bibasilar opacities may reflect atelectasis or possibly minimal interstitial edema. There is no evidence of pleural effusion or pneumothorax. The heart is enlarged. No acute osseous abnormalities are seen. Anterior bridging osteophytes are seen along the lower thoracic spine. IMPRESSION: Cardiomegaly. Mild bibasilar opacities may reflect atelectasis or possibly minimal interstitial edema. Electronically Signed   By: Roanna Raider M.D.   On: Apr 05, 2016 19:28    Procedures .Critical Care Performed by: Linwood Dibbles Authorized by: Linwood Dibbles   Critical care provider statement:    Critical care time (minutes):  30   Critical care was necessary to treat or prevent imminent or life-threatening deterioration of the following conditions:  Cardiac failure   Critical care was time spent personally by me on the following activities:  Discussions with consultants, evaluation of patient's response to treatment, examination of patient, ordering and performing treatments and interventions, ordering and review of laboratory studies, ordering and review of radiographic studies, pulse oximetry, re-evaluation of patient's condition, obtaining history from patient or surrogate and review of old charts   (including critical care time)  Medications Ordered in ED Medications  diltiazem (CARDIZEM) 1 mg/mL load via infusion 10 mg (0 mg Intravenous Hold Apr 05, 2016 2111)    And  diltiazem (CARDIZEM) 100 mg in dextrose 5 % 100 mL (1 mg/mL) infusion (15 mg/hr Intravenous Rate/Dose Change 2016/04/05 2304)  furosemide (LASIX) injection 40 mg (0 mg Intravenous Hold 04/05/16 2306)  sodium chloride 0.9  % bolus 500 mL (500 mLs Intravenous New Bag/Given 05-Apr-2016 2109)     Initial Impression / Assessment and Plan / ED Course  I have reviewed the triage vital signs and the nursing notes.  Pertinent labs & imaging results that were available during my care of the patient were reviewed by me and considered in my medical decision making (see chart for details).  Clinical Course as of Mar 31 2305  Mon 04-05-2016  2052 BP is low.  Will start cardizem drip but not give the bolus  [JK]  2304 Heart was decreasing into the 110s.  BP in the 90s but now BP down to the 80s.  Will consult cardiology.  Pt is not anticoagulated.  Not a candidate for cardioversion.  Consider dig?  [JK]  2306 Reviewed prior records. EF was in the 40-50 % range by echo in 2016  [JK]    Clinical Course User Index [JK] Linwood Dibbles, MD   Patient presented to the emergency room with complaints of generalized aching. However he also has been having increasing peripheral edema. His EKG shows atrial fibrillation with a rapid rate. He was given a dose of Cardizem with some improvement.  I continued him on an infusion. Patient's chest x-ray also shows evidence of pulmonary edema and his NP is elevated. I am concerned for the possibility of medical noncompliance. He was given a dose of Lasix. I will consult the medical service for admission and further treatment.  Final Clinical Impressions(s) / ED Diagnoses   Final diagnoses:  Acute congestive heart failure, unspecified congestive heart failure type [facility])  Atrial fibrillation, unspecified type (HCC)     Linwood Dibbles, MD 2016-04-05 2250  Discussed with cardiology.  Will  Dc cardizem infusion and start an amiodarone infusion because of his persistent tachcyardia and hypotension.  Linwood Dibbles, MD 03/23/2016 2322

## 2016-04-01 ENCOUNTER — Inpatient Hospital Stay (HOSPITAL_COMMUNITY): Payer: Medicare Other

## 2016-04-01 ENCOUNTER — Encounter (HOSPITAL_COMMUNITY): Payer: Self-pay | Admitting: Physician Assistant

## 2016-04-01 DIAGNOSIS — I2510 Atherosclerotic heart disease of native coronary artery without angina pectoris: Secondary | ICD-10-CM | POA: Diagnosis present

## 2016-04-01 DIAGNOSIS — E785 Hyperlipidemia, unspecified: Secondary | ICD-10-CM | POA: Diagnosis present

## 2016-04-01 DIAGNOSIS — I255 Ischemic cardiomyopathy: Secondary | ICD-10-CM

## 2016-04-01 DIAGNOSIS — F1010 Alcohol abuse, uncomplicated: Secondary | ICD-10-CM | POA: Diagnosis present

## 2016-04-01 DIAGNOSIS — I4891 Unspecified atrial fibrillation: Secondary | ICD-10-CM | POA: Diagnosis present

## 2016-04-01 DIAGNOSIS — E11649 Type 2 diabetes mellitus with hypoglycemia without coma: Secondary | ICD-10-CM | POA: Diagnosis present

## 2016-04-01 DIAGNOSIS — I739 Peripheral vascular disease, unspecified: Secondary | ICD-10-CM | POA: Diagnosis not present

## 2016-04-01 DIAGNOSIS — S91109A Unspecified open wound of unspecified toe(s) without damage to nail, initial encounter: Secondary | ICD-10-CM | POA: Diagnosis present

## 2016-04-01 DIAGNOSIS — E1151 Type 2 diabetes mellitus with diabetic peripheral angiopathy without gangrene: Secondary | ICD-10-CM | POA: Diagnosis present

## 2016-04-01 DIAGNOSIS — E1140 Type 2 diabetes mellitus with diabetic neuropathy, unspecified: Secondary | ICD-10-CM | POA: Diagnosis not present

## 2016-04-01 DIAGNOSIS — R570 Cardiogenic shock: Secondary | ICD-10-CM

## 2016-04-01 DIAGNOSIS — E872 Acidosis: Secondary | ICD-10-CM | POA: Diagnosis not present

## 2016-04-01 DIAGNOSIS — Z66 Do not resuscitate: Secondary | ICD-10-CM | POA: Diagnosis not present

## 2016-04-01 DIAGNOSIS — Z79899 Other long term (current) drug therapy: Secondary | ICD-10-CM | POA: Diagnosis not present

## 2016-04-01 DIAGNOSIS — Z87891 Personal history of nicotine dependence: Secondary | ICD-10-CM | POA: Diagnosis not present

## 2016-04-01 DIAGNOSIS — I5043 Acute on chronic combined systolic (congestive) and diastolic (congestive) heart failure: Secondary | ICD-10-CM

## 2016-04-01 DIAGNOSIS — Z8249 Family history of ischemic heart disease and other diseases of the circulatory system: Secondary | ICD-10-CM | POA: Diagnosis not present

## 2016-04-01 DIAGNOSIS — Z7984 Long term (current) use of oral hypoglycemic drugs: Secondary | ICD-10-CM | POA: Diagnosis not present

## 2016-04-01 DIAGNOSIS — Z9119 Patient's noncompliance with other medical treatment and regimen: Secondary | ICD-10-CM | POA: Diagnosis not present

## 2016-04-01 DIAGNOSIS — Z833 Family history of diabetes mellitus: Secondary | ICD-10-CM | POA: Diagnosis not present

## 2016-04-01 DIAGNOSIS — R531 Weakness: Secondary | ICD-10-CM | POA: Diagnosis present

## 2016-04-01 DIAGNOSIS — I5082 Biventricular heart failure: Secondary | ICD-10-CM | POA: Diagnosis present

## 2016-04-01 DIAGNOSIS — Z7982 Long term (current) use of aspirin: Secondary | ICD-10-CM | POA: Diagnosis not present

## 2016-04-01 DIAGNOSIS — I252 Old myocardial infarction: Secondary | ICD-10-CM | POA: Diagnosis not present

## 2016-04-01 DIAGNOSIS — I481 Persistent atrial fibrillation: Secondary | ICD-10-CM | POA: Diagnosis present

## 2016-04-01 DIAGNOSIS — I959 Hypotension, unspecified: Secondary | ICD-10-CM | POA: Diagnosis not present

## 2016-04-01 DIAGNOSIS — I110 Hypertensive heart disease with heart failure: Secondary | ICD-10-CM | POA: Diagnosis present

## 2016-04-01 DIAGNOSIS — Y92230 Patient room in hospital as the place of occurrence of the external cause: Secondary | ICD-10-CM | POA: Diagnosis not present

## 2016-04-01 DIAGNOSIS — M109 Gout, unspecified: Secondary | ICD-10-CM | POA: Diagnosis present

## 2016-04-01 DIAGNOSIS — N179 Acute kidney failure, unspecified: Secondary | ICD-10-CM | POA: Diagnosis present

## 2016-04-01 LAB — ECHOCARDIOGRAM COMPLETE
CHL CUP DOP CALC LVOT VTI: 6.72 cm
CHL CUP RV SYS PRESS: 62 mmHg
CHL CUP STROKE VOLUME: 20 mL
CHL CUP TV REG PEAK VELOCITY: 342 cm/s
EERAT: 5.6
FS: 16 % — AB (ref 28–44)
HEIGHTINCHES: 76 in
IV/PV OW: 0.91
LA diam end sys: 51 mm
LA diam index: 2.09 cm/m2
LA vol A4C: 114 ml
LA vol index: 47.1 mL/m2
LASIZE: 51 mm
LAVOL: 115 mL
LV E/e' medial: 5.6
LV E/e'average: 5.6
LV SIMPSON'S DISK: 15
LV TDI E'MEDIAL: 8.38
LV dias vol: 135 mL (ref 62–150)
LV sys vol index: 47 mL/m2
LV sys vol: 115 mL — AB (ref 21–61)
LVDIAVOLIN: 55 mL/m2
LVELAT: 12.3 cm/s
LVOT area: 2.84 cm2
LVOT peak grad rest: 1 mmHg
LVOTD: 19 mm
LVOTPV: 58.4 cm/s
LVOTSV: 19 mL
MV VTI: 101 cm
MV pk E vel: 68.9 m/s
PW: 11 mm — AB (ref 0.6–1.1)
TAPSE: 12.4 mm
TDI e' lateral: 12.3
TR max vel: 342 cm/s
WEIGHTICAEL: 3857.6 [oz_av]

## 2016-04-01 LAB — GLUCOSE, CAPILLARY
GLUCOSE-CAPILLARY: 60 mg/dL — AB (ref 65–99)
GLUCOSE-CAPILLARY: 44 mg/dL — AB (ref 65–99)
GLUCOSE-CAPILLARY: 16 mg/dL — AB (ref 65–99)
Glucose-Capillary: 30 mg/dL — CL (ref 65–99)

## 2016-04-01 LAB — HEPARIN LEVEL (UNFRACTIONATED)
HEPARIN UNFRACTIONATED: 0.21 [IU]/mL — AB (ref 0.30–0.70)
Heparin Unfractionated: 0.25 IU/mL — ABNORMAL LOW (ref 0.30–0.70)

## 2016-04-01 LAB — CBC
HEMATOCRIT: 45.8 % (ref 39.0–52.0)
HEMOGLOBIN: 14.6 g/dL (ref 13.0–17.0)
MCH: 28 pg (ref 26.0–34.0)
MCHC: 31.9 g/dL (ref 30.0–36.0)
MCV: 87.7 fL (ref 78.0–100.0)
Platelets: 172 10*3/uL (ref 150–400)
RBC: 5.22 MIL/uL (ref 4.22–5.81)
RDW: 17.2 % — ABNORMAL HIGH (ref 11.5–15.5)
WBC: 7.7 10*3/uL (ref 4.0–10.5)

## 2016-04-01 LAB — BASIC METABOLIC PANEL
Anion gap: 13 (ref 5–15)
Anion gap: 16 — ABNORMAL HIGH (ref 5–15)
BUN: 22 mg/dL — AB (ref 6–20)
BUN: 27 mg/dL — AB (ref 6–20)
CHLORIDE: 106 mmol/L (ref 101–111)
CO2: 20 mmol/L — ABNORMAL LOW (ref 22–32)
CO2: 19 mmol/L — ABNORMAL LOW (ref 22–32)
CREATININE: 1.62 mg/dL — AB (ref 0.61–1.24)
CREATININE: 1.78 mg/dL — AB (ref 0.61–1.24)
Calcium: 9 mg/dL (ref 8.9–10.3)
Calcium: 9 mg/dL (ref 8.9–10.3)
Chloride: 101 mmol/L (ref 101–111)
GFR calc Af Amer: 52 mL/min — ABNORMAL LOW (ref >60)
GFR calc Af Amer: 46 mL/min — ABNORMAL LOW (ref >60)
GFR calc non Af Amer: 45 mL/min — ABNORMAL LOW (ref >60)
GFR, EST NON AFRICAN AMERICAN: 40 mL/min — AB (ref >60)
GLUCOSE: 62 mg/dL — AB (ref 65–99)
Glucose, Bld: 52 mg/dL — ABNORMAL LOW (ref 65–99)
POTASSIUM: 4.8 mmol/L (ref 3.5–5.1)
POTASSIUM: 4.9 mmol/L (ref 3.5–5.1)
SODIUM: 139 mmol/L (ref 135–145)
Sodium: 136 mmol/L (ref 135–145)

## 2016-04-01 LAB — COOXEMETRY PANEL
Carboxyhemoglobin: 0.9 % (ref 0.5–1.5)
Methemoglobin: 0.9 % (ref 0.0–1.5)
O2 Saturation: 55.6 %
Total hemoglobin: 14 g/dL (ref 12.0–16.0)

## 2016-04-01 LAB — MAGNESIUM: MAGNESIUM: 1.5 mg/dL — AB (ref 1.7–2.4)

## 2016-04-01 LAB — MRSA PCR SCREENING: MRSA by PCR: NEGATIVE

## 2016-04-01 LAB — HIV ANTIBODY (ROUTINE TESTING W REFLEX): HIV Screen 4th Generation wRfx: NONREACTIVE

## 2016-04-01 LAB — TSH: TSH: 1.926 u[IU]/mL (ref 0.350–4.500)

## 2016-04-01 MED ORDER — FUROSEMIDE 10 MG/ML IJ SOLN
120.0000 mg/h | Freq: Once | INTRAVENOUS | Status: AC
  Administered 2016-04-01: 120 mg/h via INTRAVENOUS
  Filled 2016-04-01: qty 25

## 2016-04-01 MED ORDER — INSULIN ASPART 100 UNIT/ML ~~LOC~~ SOLN: 0.0000 [IU] | SUBCUTANEOUS | Status: DC

## 2016-04-01 MED ORDER — ASPIRIN EC 81 MG PO TBEC: 81.0000 mg | DELAYED_RELEASE_TABLET | ORAL | Status: DC

## 2016-04-01 MED ORDER — ACETAMINOPHEN 325 MG PO TABS: 650.0000 mg | ORAL_TABLET | ORAL | Status: DC | PRN

## 2016-04-01 MED ORDER — COLCHICINE 0.6 MG PO TABS: 0.6000 mg | ORAL_TABLET | Freq: Every day | ORAL | Status: DC | PRN

## 2016-04-01 MED ORDER — INDOMETHACIN 25 MG PO CAPS
25.0000 mg | ORAL_CAPSULE | Freq: Every day | ORAL | Status: DC | PRN
  Filled 2016-04-01: qty 1

## 2016-04-01 MED ORDER — HEPARIN (PORCINE) IN NACL 100-0.45 UNIT/ML-% IJ SOLN
1800.0000 [IU]/h | INTRAMUSCULAR | Status: DC
  Administered 2016-04-01: 1600 [IU]/h via INTRAVENOUS
  Administered 2016-04-01: 1800 [IU]/h via INTRAVENOUS
  Filled 2016-04-01 (×2): qty 250

## 2016-04-01 MED ORDER — PRAVASTATIN SODIUM 40 MG PO TABS
40.0000 mg | ORAL_TABLET | Freq: Every day | ORAL | Status: DC
  Administered 2016-04-01: 40 mg via ORAL
  Filled 2016-04-01 (×2): qty 1

## 2016-04-01 MED ORDER — FUROSEMIDE 10 MG/ML IJ SOLN: 80.0000 mg | Freq: Two times a day (BID) | INTRAMUSCULAR | Status: DC

## 2016-04-01 MED ORDER — MUPIROCIN CALCIUM 2 % EX CREA
TOPICAL_CREAM | Freq: Every day | CUTANEOUS | Status: DC
  Administered 2016-04-01 – 2016-04-02 (×2): via TOPICAL
  Filled 2016-04-01 (×3): qty 15

## 2016-04-01 MED ORDER — OXYCODONE-ACETAMINOPHEN 5-325 MG PO TABS
1.0000 | ORAL_TABLET | Freq: Four times a day (QID) | ORAL | Status: DC | PRN
  Administered 2016-04-01 – 2016-04-02 (×3): 1 via ORAL
  Filled 2016-04-01 (×3): qty 1

## 2016-04-01 MED ORDER — SODIUM CHLORIDE 0.9% FLUSH: 10.0000 mL | INTRAVENOUS | Status: DC | PRN

## 2016-04-01 MED ORDER — DILTIAZEM HCL ER COATED BEADS 240 MG PO CP24: 240.0000 mg | ORAL_CAPSULE | Freq: Every day | ORAL | Status: DC

## 2016-04-01 MED ORDER — FUROSEMIDE 10 MG/ML IJ SOLN
120.0000 mg | Freq: Two times a day (BID) | INTRAVENOUS | Status: DC
  Filled 2016-04-01: qty 12

## 2016-04-01 MED ORDER — HEPARIN BOLUS VIA INFUSION
4000.0000 [IU] | Freq: Once | INTRAVENOUS | Status: AC
  Administered 2016-04-01: 4000 [IU] via INTRAVENOUS
  Filled 2016-04-01: qty 4000

## 2016-04-01 MED ORDER — INSULIN ASPART 100 UNIT/ML ~~LOC~~ SOLN: 0.0000 [IU] | Freq: Three times a day (TID) | SUBCUTANEOUS | Status: DC

## 2016-04-01 MED ORDER — MILRINONE LACTATE IN DEXTROSE 20-5 MG/100ML-% IV SOLN
0.2500 ug/kg/min | INTRAVENOUS | Status: DC
  Administered 2016-04-01 – 2016-04-02 (×2): 0.25 ug/kg/min via INTRAVENOUS
  Filled 2016-04-01 (×3): qty 100

## 2016-04-01 MED ORDER — LORATADINE 10 MG PO TABS: 10.0000 mg | ORAL_TABLET | Freq: Every day | ORAL | Status: DC | PRN

## 2016-04-01 MED ORDER — AMIODARONE IV BOLUS ONLY 150 MG/100ML
150.0000 mg | Freq: Once | INTRAVENOUS | Status: AC
  Administered 2016-04-01: 150 mg via INTRAVENOUS

## 2016-04-01 MED ORDER — HEPARIN (PORCINE) IN NACL 100-0.45 UNIT/ML-% IJ SOLN
2300.0000 [IU]/h | INTRAMUSCULAR | Status: DC
  Administered 2016-04-01: 2000 [IU]/h via INTRAVENOUS
  Administered 2016-04-02: 2300 [IU]/h via INTRAVENOUS
  Filled 2016-04-01 (×2): qty 250

## 2016-04-01 MED ORDER — HEPARIN BOLUS VIA INFUSION
1000.0000 [IU] | Freq: Once | INTRAVENOUS | Status: AC
  Administered 2016-04-01: 1000 [IU] via INTRAVENOUS
  Filled 2016-04-01: qty 1000

## 2016-04-01 MED ORDER — METOPROLOL TARTRATE 25 MG PO TABS: 50.0000 mg | ORAL_TABLET | Freq: Two times a day (BID) | ORAL | Status: DC

## 2016-04-01 MED ORDER — ONDANSETRON HCL 4 MG/2ML IJ SOLN: 4.0000 mg | Freq: Four times a day (QID) | INTRAMUSCULAR | Status: DC | PRN

## 2016-04-01 MED ORDER — FUROSEMIDE 10 MG/ML IJ SOLN
80.0000 mg | Freq: Two times a day (BID) | INTRAMUSCULAR | Status: DC
  Administered 2016-04-01 (×2): 80 mg via INTRAVENOUS
  Filled 2016-04-01 (×2): qty 8

## 2016-04-01 MED ORDER — FUROSEMIDE 10 MG/ML IJ SOLN
40.0000 mg | Freq: Two times a day (BID) | INTRAMUSCULAR | Status: DC
  Administered 2016-04-01: 40 mg via INTRAVENOUS
  Filled 2016-04-01: qty 4

## 2016-04-01 NOTE — Progress Notes (Signed)
ANTICOAGULATION CONSULT NOTE - Initial Consult  Pharmacy Consult for Heparin Indication: atrial fibrillation  No Known Allergies  Patient Measurements: Height: 6\' 4"  (193 cm) Weight: 225 lb (102.1 kg) IBW/kg (Calculated) : 86.8 Heparin Dosing Weight: 102 kg  Vital Signs: Temp: 97.6 F (36.4 C) (02/19 1817) Temp Source: Oral (02/19 1817) BP: 101/83 (02/19 2330) Pulse Rate: 58 (02/19 2300)  Labs:  Recent Labs  03/28/2016 1850  HGB 14.9  HCT 45.8  PLT 154  CREATININE 1.12    Estimated Creatinine Clearance: 87.2 mL/min (by C-G formula based on SCr of 1.12 mg/dL).   Medical History: Past Medical History:  Diagnosis Date  . Alcohol abuse   . Atrial fibrillation (HCC)    CHADS2=3; Xarelto started 7/13; s/p TEE-DCCV => NSR;  patient back in AF with RVR at f/u 8/13 => amiodarone started => back in NSR 9/13 f/u OV  . CAD (coronary artery disease)    LHC 7/13: mOM1 50-75%, ostial subbranch 80-90% (very small), PL branch 80-90% (very small), mRCA 40-50%, EF 30% => med Rx for small vessel disease  . Chronic combined systolic and diastolic CHF (congestive heart failure) (HCC)   . Diabetes mellitus   . Epistaxis 08/2011   On heparin and Coumadin s/p silver nitrate cauterization L nare  . Gout   . Hyperlipidemia   . Hypertension   . Ischemic cardiomyopathy    echo 7/13: mild LVH, EF 25-30%, global HK with severe Hk to AK of basal to mid inf and post walls, severe diast dysfxn, mild MR, mod LAE, mod reduced RV fxn, mod RAE, PASP 50 (mild to mod pulmo HTN);  TEE 7/13 with EF 30-35%  . MI (myocardial infarction)   . Tobacco abuse     Medications:  See electronic med rec  Assessment: 61 y.o. M presents with afib. To begin heparin gtt. Noted that pt has been tried on multiple agents (Coumadin and Xarelto) and unable to tolerate due to epistaxis.  From cardiology op note 11/27/15: "He had atrial fibrillation back in 2013 which is now chronic and is unable to tolerate any  anticoagulant medications. He was tried on Coumadin and Xarelto. Says he 'almost bled out' on both drugs. Complained of severe epistaxis and has been unwilling to try any other anticoagulant drugs since. I have seen him back several times - still very adamant about not taking anticoagulation."  CBC ok on admission  Goal of Therapy:  Heparin level 0.3-0.7 units/ml Monitor platelets by anticoagulation protocol: Yes   Plan:  Heparin IV bolus 4000 units Heparin gtt at 1600 units/hr Will f/u heparin level in 6 hours Daily heparin level and CBC F/u longterm AC plans  Christoper Fabianaron Emmajane Altamura, PharmD, BCPS Clinical pharmacist, pager [phone] 04/01/2016,12:33 AM

## 2016-04-01 NOTE — Progress Notes (Signed)
ANTICOAGULATION CONSULT NOTE  Pharmacy Consult for Heparin Indication: atrial fibrillation  No Known Allergies  Patient Measurements: Height: 6\' 4"  (193 cm) Weight: 241 lb 1.6 oz (109.4 kg) IBW/kg (Calculated) : 86.8 Heparin Dosing Weight: 102 kg  Vital Signs: Temp: 97.2 F (36.2 C) (02/20 0749) Temp Source: Oral (02/20 0749) BP: 106/47 (02/20 0749) Pulse Rate: 34 (02/20 0749)  Labs:  Recent Labs  03/29/2016 1850 04/01/16 0202 04/01/16 0728  HGB 14.9 14.6  --   HCT 45.8 45.8  --   PLT 154 172  --   HEPARINUNFRC  --   --  0.21*  CREATININE 1.12  --   --     Estimated Creatinine Clearance: 96.2 mL/min (by C-G formula based on SCr of 1.12 mg/dL).   Medical History: Past Medical History:  Diagnosis Date  . Alcohol abuse   . Atrial fibrillation (HCC)    CHADS2=3; Xarelto started 7/13; s/p TEE-DCCV => NSR;  patient back in AF with RVR at f/u 8/13 => amiodarone started => back in NSR 9/13 f/u OV  . CAD (coronary artery disease)    LHC 7/13: mOM1 50-75%, ostial subbranch 80-90% (very small), PL branch 80-90% (very small), mRCA 40-50%, EF 30% => med Rx for small vessel disease  . Chronic combined systolic and diastolic CHF (congestive heart failure) (HCC)   . Diabetes mellitus   . Epistaxis 08/2011   On heparin and Coumadin s/p silver nitrate cauterization L nare  . Gout   . Hyperlipidemia   . Hypertension   . Ischemic cardiomyopathy    echo 7/13: mild LVH, EF 25-30%, global HK with severe Hk to AK of basal to mid inf and post walls, severe diast dysfxn, mild MR, mod LAE, mod reduced RV fxn, mod RAE, PASP 50 (mild to mod pulmo HTN);  TEE 7/13 with EF 30-35%  . MI (myocardial infarction)   . Tobacco abuse       Assessment: 61 y.o. M presents with afib. To begin heparin gtt. Noted that pt has been tried on multiple agents (Coumadin and Xarelto) and unable to tolerate due to epistaxis.  -Initial heparin level= 0.21 -CBC stable  Goal of Therapy:  Heparin level  0.3-0.7 units/ml Monitor platelets by anticoagulation protocol: Yes   Plan:  -heparin 1000 unit bolus and increase to 1800 units/hr -Heparin level in 6 hours and daily wth CBC daily  Harland Germanndrew Doristine Shehan, Pharm D 04/01/2016 9:45 AM

## 2016-04-01 NOTE — Progress Notes (Addendum)
  61 y/o male followed by Dr. Excell Seltzerooper with h/o CAD, chronic AF, systolic HF with EF ~35%, DM2 and ETOH use.  Admitted with recurrent HF. Echo (images reviewed personally). Shows LVEF 10-15% with severe RV dysfunction as well. Today developed cardiogenic shock with AKI. Cr 1.1->1.8 with no response to 40mg  IV lasix - no u/o this evening. Bilirubin 4.0. He has chronic AF but rate now up in 150-160 range despite IV amio. He has refused AC in past.   Started on milrinone empirically today.  PICC line placed and co-ox 56% on milrinone 0.25. CVP 18.   Denies SOB but feels fatigued. He is fixated on the fact that this is related to a [place] [employer] [place]. Says he has gained 20 pounds and that correlates with the change from his most recent clinic weight though it looks like he has more fluid overload than that on exam.   General:  Fatigued appearing. Sallow. Chronically ill.  HEENT: normal x for poor dentition Neck: supple. JVP to ear. Carotids 2+ bilat;  No lymphadenopathy or thryomegaly appreciated. Cor: PMI laterally displaced.Tachy irregular. Prominent s3 Lungs: basilar crackles Abdomen: soft, nontender, +distended. No hepatosplenomegaly. No bruits or masses. Good bowel sounds. Extremities: no cyanosis, clubbing, rash, 3+ edema into thighs. Cool with skin breakdown  Neuro: alert & orientedx3, cranial nerves grossly intact. moves all 4 extremities w/o difficulty. Affect pleasant   A/P  1. Cardiogenic shock 2. A/c biventricular systolic HF     --LVEF 10-15% by echo 2/20 3. AKI 4. Chronic AF now with RVR 5. ETOH abuse 6. CAD 7. DM2  He is critically ill with low output, marked volume overload and AKI. Severe biventricular dysfunction on echo. Also c/b chronic AF now with RVR. Unclear what triggered EF to fall - ?ETOH.   Co-ox now marginal on milrinone 0.25. Will increase to 0.375 and give 120 mg IV lasix to see if we he will make urine. Check lactate and co-ox in am. D/c indomethacin.  (can use colchicine or prednisone for gout flare).  Given comorbidities, ETOH abuse and lack of insight into his condition he is not a candidate for advanced therapies. Will continue inotropic support and IV amio and see if we can stabilize him. If not, may be heading toward Palliative Care. I d/w Dr. Excell Seltzerooper. The HF team will follow.   CCT 45 minutes.   Bensimhon, Daniel,MD 11:35 PM

## 2016-04-01 NOTE — Progress Notes (Signed)
Echocardiogram 2D Echocardiogram has been performed.  Timothy Olsen 04/01/2016, 3:19 PM

## 2016-04-01 NOTE — Consult Note (Signed)
CARDIOLOGY CONSULT NOTE   Patient ID: Timothy Olsen MRN: [id number] DOB/AGE: 60-13-1957 60 y.o.  Admit date: Apr 11, 2016  Primary Physician   Boneta Lucks, NP Primary Cardiologist   Dr Excell Seltzer, 08/25/2013, Olsen. Tyrone Sage, NP 05/30/2015 Reason for Consultation   CP Requesting MD: Dr Jarvis Newcomer  NWG:NFAOZHY Olsen Timothy is a 61 y.o. year old male with a history of HTN, DM, HLD, diffuse small-vessel dz at cath 2013, chronic AF w/ bleeding issues coumadin and Xarelto (not Watchman candidate), CHA2DS2VASc=4 (HTN, CAD, DM, CHF), S-D-CHF, periph neuropathy, ETOH use  He does not weigh daily but has a scale. Says his weight is up about 20 lbs on his scales. His brother gives him rides but he does his own medications. Does not really pay attention to salt.   He does not feel the atrial fib, has no awareness that his heart is out of rhythm or fast.  No bleeding issues recently.  Pt denies chest pain. C/o pain in his back pain, leg pain and knee pain. States he has had MS pain ever since he fell [place] [employer] bus 03/21/2016.   C/o numbness in his hands for days.   Pt was having pain from the fall, came to the hospital yesterday to get the fluid off his legs. Is upset because he has not urinated since he got here.     Past Medical History:  Diagnosis Date  . Alcohol abuse   . Atrial fibrillation (HCC)    CHADS2=3; Xarelto started 7/13; s/p TEE-DCCV => NSR;  patient back in AF with RVR at f/u 8/13 => amiodarone started => back in NSR 9/13 f/u OV  . CAD (coronary artery disease)    LHC 7/13: mOM1 50-75%, ostial subbranch 80-90% (very small), PL branch 80-90% (very small), mRCA 40-50%, EF 30% => med Rx for small vessel disease  . Chronic combined systolic and diastolic CHF (congestive heart failure) (HCC)   . Diabetes mellitus   . Epistaxis 08/2011   On heparin and Coumadin s/p silver nitrate cauterization Olsen nare  . Gout   . Hyperlipidemia   . Hypertension   . Ischemic cardiomyopathy    echo 7/13: mild LVH, EF 25-30%, global HK with severe Hk to AK of basal to mid inf and post walls, severe diast dysfxn, mild MR, mod LAE, mod reduced RV fxn, mod RAE, PASP 50 (mild to mod pulmo HTN);  TEE 7/13 with EF 30-35%  . MI (myocardial infarction)   . Tobacco abuse      Past Surgical History:  Procedure Laterality Date  . ADENOIDECTOMY    . CARDIAC CATHETERIZATION  09/09/2011   a) widely patent left main with minor luminal irregularities, patent LAD, mild prox LAD calcification and luminal irregularities, patent D1, diffuse prox and LCX- mid irregularity, 50-75% mid OM1 stenosis, 80-90% ostial stenosis in small subbranch, small PLB 80-90% stenosis, RCA- mild proximal luminal irregularity, mid 40-50% stenosis, distal irregularity, PDA and PLB patent; LVEF 30% and global Olsen  . CARDIOVERSION  09/15/2011   Procedure: CARDIOVERSION;  Surgeon: Lewayne Bunting, MD;  Location: [facility] ENDOSCOPY;  Service: Cardiovascular;  Laterality: N/A;  . LEFT HEART CATHETERIZATION WITH CORONARY ANGIOGRAM N/A 09/09/2011   Procedure: LEFT HEART CATHETERIZATION WITH CORONARY ANGIOGRAM;  Surgeon: Tonny Bollman, MD;  Location: [facility] CATH LAB;  Service: Cardiovascular;  Laterality: N/A;  . left thumb surgery    . TEE WITHOUT CARDIOVERSION  09/15/2011   Procedure: TRANSESOPHAGEAL ECHOCARDIOGRAM (TEE);  Surgeon: Lewayne Bunting, MD;  Location: [facility] ENDOSCOPY;  Service: Cardiovascular;  Laterality: N/A;  . TONSILLECTOMY      No Known Allergies  I have reviewed the patient's current medications . [START ON 04/02/2016] aspirin EC  81 mg Oral Once per day on Mon Thu  . furosemide  80 mg Intravenous BID  . insulin aspart  0-15 Units Subcutaneous TID WC  . mupirocin cream   Topical Daily  . pravastatin  40 mg Oral q1800   . amiodarone 30 mg/hr (04/01/16 1501)  . heparin     acetaminophen, colchicine, indomethacin, loratadine, ondansetron (ZOFRAN) IV, oxyCODONE-acetaminophen  Medication Sig  allopurinol (ZYLOPRIM) 100 MG  tablet Take 100 mg by mouth daily as needed (for gout flare up).   aspirin EC 81 MG tablet Take 81 mg by mouth 2 (two) times a week.   CARTIA XT 240 MG 24 hr capsule TAKE ONE CAPSULE BY MOUTH DAILY  colchicine 0.6 MG tablet Take 0.6-1.2 mg by mouth daily as needed (2 tabs at first sign or gout flare up than 1 tab an hour later).   furosemide (LASIX) 40 MG tablet Is taking as needed Patient taking differently: Take 40 mg by mouth daily as needed for fluid. Is taking as needed  glipiZIDE (GLUCOTROL) 5 MG tablet Take 5 mg by mouth 2 (two) times daily before a meal.    hydrALAZINE (APRESOLINE) 50 MG tablet TAKE ONE TABLET BY MOUTH THREE TIMES DAILY Patient taking differently: TAKE ONE TABLET BY MOUTH TWICE TIMES DAILY  indomethacin (INDOCIN) 25 MG capsule Take 25 mg by mouth daily as needed (gout flare up).   lisinopril (PRINIVIL,ZESTRIL) 40 MG tablet TAKE ONE TABLET BY MOUTH ONCE DAILY  loratadine (CLARITIN) 10 MG tablet Take 10 mg by mouth daily as needed for allergies.  metFORMIN (GLUCOPHAGE) 1000 MG tablet Take 1,000 mg by mouth 2 (two) times daily with a meal. Sliding Scale  metoprolol (LOPRESSOR) 50 MG tablet TAKE ONE TABLET BY MOUTH TWICE DAILY  nitroGLYCERIN (NITROSTAT) 0.4 MG SL tablet Place 1 tablet (0.4 mg total) under the tongue every 5 (five) minutes as needed. For chest pain  oxyCODONE-acetaminophen (PERCOCET/ROXICET) 5-325 MG tablet Take 1 tablet by mouth every 6 (six) hours as needed for severe pain.  potassium chloride SA (K-DUR,KLOR-CON) 20 MEQ tablet Take 1 tablet (20 mEq total) by mouth daily. With lasix only Patient taking differently: Take 20 mEq by mouth daily as needed (lasix). With lasix only  pravastatin (PRAVACHOL) 40 MG tablet Take 40 mg by mouth daily.      Social History   Social History  . Marital status: Single    Spouse name: N/A  . Number of children: N/A  . Years of education: N/A   Occupational History  . Disabled    Social History Main Topics  .  Smoking status: Former Smoker    Packs/day: 0.50    Years: 2.00    Types: Cigarettes    Quit date: 02/11/2011  . Smokeless tobacco: Never Used  . Alcohol use 3.0 oz/week    6 Standard drinks or equivalent per week     Comment: rarely  . Drug use: No  . Sexual activity: No   Other Topics Concern  . Not on file   Social History Narrative   Pt lives alone, brother checks on him.    Family Status  Relation Status  . Mother Deceased  . Father Alive  . Brother Alive   Family History  Problem Relation Age of Onset  . Diabetes type  II Mother   . Hypertension Father      ROS:  Full 14 point review of systems complete and found to be negative unless listed above.  Physical Exam: Blood pressure 114/66, pulse (!) 28, temperature 98.2 F (36.8 C), resp. rate (!) 27, height 6\' 4"  (1.93 m), weight 241 lb 1.6 oz (109.4 kg), SpO2 100 %.  General: Well developed, well nourished, male in no acute distress Head: Eyes PERRLA, No xanthomas.   Normocephalic and atraumatic, oropharynx without edema or exudate. Dentition: poor Lungs: bibasilar rales Heart: Heart irregular rate and rhythm with S1, S2  murmur. pulses are decreased and not palpable in upper or lower extrem.  Lower extrem limited by edema. Neck: No carotid bruits. No lymphadenopathy.  JVD 10 cm Abdomen: Bowel sounds present, abdomen soft and non-tender without masses or hernias noted. Msk:  No spine or cva tenderness. No weakness, no joint deformities or effusions. Extremities: No clubbing or cyanosis. 2+ edema. Cyanosis notes in both hands Neuro: Alert and oriented X 3. No focal deficits noted. Psych:  Good affect, responds appropriately Skin: No rashes or lesions noted. Multiple wounds, worst on R 2nd toe, slow/not healing.   Labs:   Lab Results  Component Value Date   WBC 7.7 04/01/2016   HGB 14.6 04/01/2016   HCT 45.8 04/01/2016   MCV 87.7 04/01/2016   PLT 172 04/01/2016     Recent Labs Lab Apr 30, 2016 1850   04/01/16 1519  NA 138  < > 136  K 4.2  < > 4.9  CL 106  < > 101  CO2 20*  < > 19*  BUN 15  < > 27*  CREATININE 1.12  < > 1.78*  CALCIUM 9.0  < > 9.0  PROT 5.5*  --   --   BILITOT 4.0*  --   --   ALKPHOS 108  --   --   ALT 15*  --   --   AST 23  --   --   GLUCOSE 98  < > 52*  ALBUMIN 2.8*  --   --   < > = values in this interval not displayed. Magnesium  Date Value Ref Range Status  04/01/2016 1.5 (Olsen) 1.7 - 2.4 mg/dL Final   No results for input(s): CKTOTAL, CKMB, TROPONINI in the last 72 hours.  Recent Labs  Apr 30, 2016 1907  TROPIPOC 0.05   B Natriuretic Peptide  Date/Time Value Ref Range Status  2016-04-30 08:03 PM 1,089.5 (H) 0.0 - 100.0 pg/mL Final   Lipase  Date/Time Value Ref Range Status  04/30/2016 08:03 PM 13 11 - 51 U/Olsen Final   TSH  Date/Time Value Ref Range Status  04/01/2016 08:35 AM 1.926 0.350 - 4.500 uIU/mL Final    Comment:    Performed by a 3rd Generation assay with a functional sensitivity of <=0.01 uIU/mL.  08/23/2014 09:03 AM 1.46 0.35 - 4.50 uIU/mL Final   T3, Free  Date/Time Value Ref Range Status  09/25/2011 03:55 PM 2.5 2.3 - 4.2 pg/mL Final    ECG:  Apr 30, 2016 Atrial fib, RVR, HR 154  ECHO: 04/01/2016 - Left ventricle: The cavity size was mildly to moderately dilated.   Wall thickness was normal. The estimated ejection fraction was   15%. Diffuse hypokinesis. Indeterminant diastolic function   (atrial fibrillation). - Aortic valve: There was no stenosis. - Mitral valve: There was mild to moderate regurgitation. - Left atrium: The atrium was moderately dilated. - Right ventricle: The cavity size was moderately dilated.  Systolic   function was moderately to severely reduced. - Right atrium: The atrium was moderately dilated. - Tricuspid valve: There was moderate regurgitation. Peak RV-RA   gradient (S): 47 mm Hg. - Pulmonary arteries: PA peak pressure: 62 mm Hg (S). - Systemic veins: IVC measured 3.2 cm with < 50% respirophasic    variation, suggesting RA pressure 15 mmHg. - Pericardium, extracardiac: A trivial pericardial effusion was   identified posterior to the heart. Impressions: - The patient was in atrial fibrillation. Mild to moderately   dilated LV with EF 15%, diffuse hypokinesis. Moderately dilated   RV with moderate to severely decreased systolic function. Mild to   moderate MR. Moderate TR. Moderate pulmonary hypertension.   Dilated IVC suggestive of elevated RV filling pressure.   Echo Study Conclusions from 08/17/2014 - Left ventricle: The cavity size was mildly dilated. Wall thickness was normal. Systolic function was mildly reduced. The estimated ejection fraction was in the range of 45% to 50%. Hypokinesis of the basal-midinferolateral myocardium. - Mitral valve: There was mild to moderate regurgitation directed centrally. - Left atrium: The atrium was severely dilated. - Right atrium: The atrium was mildly dilated. - Pulmonary arteries: Systolic pressure was mildly to moderately increased. PA peak pressure: 48 mm Hg (S).  Cardiac Cath 09/09/2011: Coronary angiography: Left mainstem: Widely patent with minor luminal irregularity. Left anterior descending (LAD): Patent to the left ventricular apex. The first diagonal branch is patent. There is mild proximal LAD calcification and also mild luminal irregularity but there are no significant stenoses throughout the course of the LAD distribution. Left circumflex (LCx): The left circumflex is a large-caliber vessel. The proximal and mid circumflex have mild diffuse irregularity. The first OM is patent but in the midportion of this vessel there is 50-75% stenosis. There is also a subbranch that has tight ostial stenosis but this is a very small vessel. The percent stenosis is estimated at 80-90. The continuation of the AV groove circumflex supplies a posterolateral branch which also has 80-90% stenosis. This is a very small  vessel less than 1.5 mm in caliber. Right coronary artery (RCA): The right coronary artery is dominant. There is mild luminal irregularity in the proximal vessel. The mid vessel has 40-50% stenosis. The distal vessel has luminal irregularity. The PDA and posterolateral branches are patent Left ventriculography: The left ventricle is poorly visualized with ventriculography, despite a 30 cc injection. However, there is clearly global left ventricular dysfunction and I would estimate the ejection fraction in the range of 30%. Final Conclusions:  1. 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 Nonobstructive LAD and right coronary artery stenoses 3. Severe global left ventricular dysfunction Recommendations: Medical therapy. Plan will have to be made regarding anticoagulation in this patient with atrial fibrillation and cardiomyopathy, but this is complicated by concerns about medicine adherence and alcohol use.   Radiology:  Dg Chest 2 View Result Date: 04/05/2016 CLINICAL DATA:  Chronic bilateral lower extremity pain and swelling. Initial encounter. EXAM: CHEST  2 VIEW COMPARISON:  Chest radiograph performed 03/19/2016 FINDINGS: The lungs are well-aerated. Mild bibasilar opacities may reflect atelectasis or possibly minimal interstitial edema. There is no evidence of pleural effusion or pneumothorax. The heart is enlarged. No acute osseous abnormalities are seen. Anterior bridging osteophytes are seen along the lower thoracic spine. IMPRESSION: Cardiomegaly. Mild bibasilar opacities may reflect atelectasis or possibly minimal interstitial edema. Electronically Signed   By: Roanna Raider M.D.   On: 04/01/2016  19:28    ASSESSMENT AND PLAN:   The patient was seen today by Dr Excell Seltzer, the patient evaluated and the data reviewed.  Principal Problem:   Atrial fibrillation with RVR (HCC) - agree with amio for rate control for now - believe  elevated HR is 2nd other acute medical issues   Active Problems:   Ischemic cardiomyopathy - no ACE/ARB 2nd renal dysfunction - BP has been low so BB on hold    Diabetes mellitus (HCC) - per IM    Acute on chronic combined systolic and diastolic CHF (congestive heart failure) (HCC) - pt had Lasix 40 mg approx 8 am and 80 mg Lasix at approx 1 pm.  - No UOP today and bladder scan showed no urine. - BMET rechecked and K+ as well as BUN/Cr slightly higher    Non-healing open wound of toe - per IM   SignedTheodore Demark, PA-C 04/01/2016 5:02 PM Beeper [phone]  Co-Sign MD  ADDENDUM: ECHO now available and EF is lower, results above. Leanna Battles 04/01/2016 5:31 PM Beeper [phone]  Patient seen, examined. Available data reviewed. Agree with findings, assessment, and plan as outlined by Theodore Demark, PA-C. The patient is independently interviewed and examined. He is well-known to me showing the outpatient setting. He has a history of medical noncompliance. The patient has been followed for coronary artery disease and permanent atrial fibrillation. He has been unable or unwilling to take anticoagulant drugs for atrial fibrillation.  He presents now with acute systolic heart failure. He has marked lower extremity edema. On examination he is alert and oriented, in no distress. He is mentating normally. Jugular venous pressure is elevated to the angle of the mandible. Lung fields are clear. Heart is irregularly irregular with a grade 3/6 holosystolic murmur at the left lower sternal border. There is no diastolic murmur. The abdomen is soft and nontender. Extremities have 3+ edema bilaterally. His fingers are cyanotic and somewhat dusky. Peripheral pulses are diminished.  The patient has received high-dose is of IV diuretics today without making significant urine. He has clinical signs of cardiogenic shock and low output congestive heart failure. Echocardiogram is reviewed and  his LVEF is severely depressed (10-15%) by echo today. He has biventricular heart failure with severe RV dysfunction on echo as well.  I doubt the patient will be a candidate for advanced heart failure therapies. However, it is certainly reasonable to treat him aggressively up front. Will place a 3 lumen PICC line, draw a co-ox and CVP, and plan to start milrinone. I discussed his case with Dr Gala Romney who will help with his management after we get initial data back. The patient is at high risk of complications such as acute renal failure and respiratory failure. While a PICC line is not ideal considering his risk of renal failure, I do not think he will cooperate for an IJ line and he needs vasoactive medication to support his cardiac function.   The patient is critically ill with multiple organ systems failure and requires high complexity decision making for assessment and support, frequent evaluation and titration of therapies, application of advanced monitoring technologies and extensive interpretation of multiple databases.   Critical Care Time devoted to patient care services described in this note is 45 minutes.  Tonny Bollman, M.D. 04/01/2016 6:09 PM

## 2016-04-01 NOTE — Progress Notes (Signed)
Patient admitted earlier this AM. Please see H&P for full details.   Temp 97.2 F  HR 97bpm I Resp 22   BP 106/47 I SpO2 100% on room air  Wt Readings from Last 3 Encounters:  04/01/16 109.4 kg (241 lb 1.6 oz)  03/19/16 102.1 kg (225 lb)  11/27/15 98.4 kg (217 lb)    AFib w/RVR: dilt caused hypotension, so amio load and gtt started with improvement.  - I've verified that cardiology will see patient today. Patient of Norma FredricksonLori Gerhardt. - Continue SDU treatment with continuous monitoring.  - Echo pending. Last echo 2016 showed severe LAE, so suspect AFib will be an ongoing problem for him. - Continuing heparin pending cariology evaluation. CHA2DS2-VASc indicates 4.8 % stroke rate/year from a score of 4. Previous anticoagulation stopped due to epistaxis. Will monitor closely including daily CBC.  - Maintain K > 4 and Mg > 2 - Last TSH 1.46 in 2016. Will recheck.   CAD w/ischemic cardiomyopathy and reduced EF (45-50% with basal-midinferolateral hypokinesis in 2016): Trop negative. No evidence of ACS at this time.  - Continuing ASA, ranexa, statin, holding beta blocker due to hypotension currently.  - BNP is elevated at 1,089.5 and weight is 24lbs up from October 2017. Will give lasix and monitor BP, I/O and daily weights.  T2DM: Last HbA1c in system 6.1% in 2016.  - Hold metforming, glipizide - Continue SSI AC/HS  Initial complaint was generalized weakness.  - Add urine culture (6-30WBC/HPF and few bacteria on clean catch specimen ?UTI)  Hazeline Junkeryan Sahily Biddle, MD 04/01/2016 8:09 AM

## 2016-04-01 NOTE — Consult Note (Addendum)
WOC Nurse wound consult note Reason for Consult: Wounds on bilateral legs Wound type: multiple full thickness statis ulcers Pressure Injury POA: No Measurement: Right toe 1cm x 2 cm x 0 cm   R lower leg 4.5cm x 2 cm x .1cm   L lower leg with 6 ulcers 2cm x 2cm x .1cm   L lower leg distal ulcer 1cm x 2 cm Wound bed: Right toe 100% dry, scabbed, no drainage or odor. R lower leg wound 90% dry, scabbed, no drainage, 10% yellow with small drainage, no odor. L leg all wounds are 100% yellow with small drainage, no odor. Drainage (amount, consistency, odor): minimal, thin, no odor Periwound: Intact; Leg leg generalized edema and erythema with small scratch scattered marks  Dressing procedure/placement/frequency: Continue foam drsg to L leg wounds; apply Bactroban to dry scabbed wounds on R leg and R toe and cover with foam dressing.   Consider xray to Right second toe related to swelling of toe and wound area. Written by Timothy Hammingammy Blackburn, RN Grad Student [employer] Please re-consult if further assistance is needed.  Thank-you,  Timothy Olsen Timothy Issac MSN, RN, CWOCN, [place], CNS [phone]

## 2016-04-01 NOTE — Progress Notes (Signed)
Interim progress note:   Reassessed patient this afternoon when RN reported worsening purple fingertips and suspicion for inaccurately low CBGs. On evaluation he has weakly palpable radial pulses bilaterally and cold and mildly cyanotic fingers with intact sensation and ROM. He tells me this is a relatively chronic problem with associated numbness that comes and goes. He has a long history of PVD.   Also had no UOP and negative bladder scan earlier for which lasix was increased to 80mg  IV. He still had no UOP until 5:15pm. When I arrived he reported urinary incontinence around condom catheter (actually a flexiseal rigged for urinary purposes as condom cath does not fit with current edema). Declines foley catheter. May require further increase in lasix dose. Concern for acute renal failure and inaccurate CBG, so STAT BMET ordered verifying hypoglycemia at a level of 52mg /dl. Creatinine mildly up 1.62 > 1.78, becoming mildly acidotic.   Low output heart failure: Echo showed significantly reduced EF (15%) and BPs remain low. Possibly causing peripheral hypoperfusion in pt with preexisting PVD. Bilateral arterial emboli felt to be less likely, and he is on heparin gtt in any case. - Follow up arterial doppler study - For hypoglycemia (?decreased oral sulfonylurea clearance), will give D50 prn. If continues, consider octreotide. - Considering central venous access for frequent lab draws and milrinone. Not likely to tolerate IJ. Has CKD, anuria and worsening renal function trajectory so may require CVVH at some point soon. Will discuss with heart failure team.   Hazeline Junkeryan Niklaus Mamaril, MD 04/01/2016 5:57 PM

## 2016-04-01 NOTE — Progress Notes (Signed)
Pt noncompliant.  Have instructed pt not to get up alone since pt was extremely weak to transfer this am and recent fall.  Pt still continues to do whatever he feels like.  Ordered low bed at this time.

## 2016-04-01 NOTE — Progress Notes (Signed)
After receiving 40 iv lasix this am, still no urine output.  Bladder scan done and showed 14ml in bladder.  Notified Dr. Jarvis NewcomerGrunz.  Ordered Lasix 80mg  iv.  Will continue to monitor.

## 2016-04-01 NOTE — Progress Notes (Signed)
Confirmed with last cbg that venous blood sugars were not correlating from capilarry sticks.  This cbg done at this time reads 10.  Pt is completely alert, skin dry, steady.  No s/s of any hypoglycemia.  Dr. Jarvis NewcomerGrunz ok with watching pt and obtaining venous draw if s/s occur.  Once picc is placed cbgs will be drawn off of the line.  Pt preparing to eat dinner.

## 2016-04-01 NOTE — Progress Notes (Signed)
Peripherally Inserted Central Catheter/Midline Placement  The IV Nurse has discussed with the patient and/or persons authorized to consent for the patient, the purpose of this procedure and the potential benefits and risks involved with this procedure.  The benefits include less needle sticks, lab draws from the catheter, and the patient may be discharged home with the catheter. Risks include, but not limited to, infection, bleeding, blood clot (thrombus formation), and puncture of an artery; nerve damage and irregular heartbeat and possibility to perform a PICC exchange if needed/ordered by physician.  Alternatives to this procedure were also discussed.  Bard Power PICC patient education guide, fact sheet on infection prevention and patient information card has been provided to patient /or left at bedside.    PICC/Midline Placement Documentation  PICC Triple Lumen 04/01/16 PICC Right Basilic 46 cm 0 cm (Active)  Indication for Insertion or Continuance of Line Vasoactive infusions 04/01/2016  8:45 PM  Exposed Catheter (cm) 0 cm 04/01/2016  8:45 PM  Site Assessment Clean;Dry;Intact 04/01/2016  8:45 PM  Lumen #1 Status Blood return noted;Flushed;Saline locked 04/01/2016  8:45 PM  Lumen #2 Status Blood return noted;Flushed;Saline locked 04/01/2016  8:45 PM  Lumen #3 Status Blood return noted;Flushed;Saline locked 04/01/2016  8:45 PM  Dressing Type Transparent 04/01/2016  8:45 PM  Dressing Status Clean;Dry;Intact;Antimicrobial disc in place 04/01/2016  8:45 PM  Dressing Intervention New dressing 04/01/2016  8:45 PM  Dressing Change Due 04/08/16 04/01/2016  8:45 PM       Netta Corriganhomas, Hinata Diener L 04/01/2016, 8:59 PM

## 2016-04-01 NOTE — Progress Notes (Addendum)
Still no urine output since last dose of 80iv lasix given.  Another bladder scan done which shows no urine in bladder.  Theodore Demarkhonda Barrett, NP here at this time and aware.   Also, pt's finger tips are turning purple, cold, and "hurting" per pt.  Dopplered right radial for faint pulse and left radial had faint pulse that would come and go.   Pulse ox doesn't read on fingers. Only on ear.  CBG at 1430 was 44 (drawn from blue cold finger)  Pt asymptomatic of hypoglycemia.  Bjorn LoserRhonda present at this time as well.  Gave 240ml orange juice.  Rechecked and value was 14.  Stat BMP ordered to verify since pt is so asymptomatic and mentating well . Notified Dr. Jarvis NewcomerGrunz of all of the above and is letting cardiology handle at this time.

## 2016-04-01 NOTE — ED Notes (Signed)
Nurse starting IV and will get labs. 

## 2016-04-01 NOTE — Progress Notes (Signed)
ANTICOAGULATION CONSULT NOTE - FOLLOW UP    HL = 0.25 (goal 0.3 - 0.7 units/mL) Heparin dosing weight = 102 kg   Assessment: 60 YOM continues on IV heparin for AFib.  Heparin level is trending up but remains sub-therapeutic.  No bleeding nor issue with infusion per RN.   Plan: - Increase heparin gtt to 2000 units/hr - Check 6 hr heparin level - F/U CBGs    Patrick Salemi D. Laney Potashang, PharmD, BCPS 04/01/2016, 4:57 PM

## 2016-04-01 NOTE — Care Management Note (Signed)
Case Management Note  Patient Details  Name: Timothy Olsen MRN: [id number] Date of Birth: 08-Oct-1955  Subjective/Objective:    Adm w at fib ,recent broken shoulder               Action/Plan: lives at home, brother and other fam assist, no hx of hhc per pt.   Expected Discharge Date:                  Expected Discharge Plan:  Home w Home Health Services  In-House Referral:     Discharge planning Services  CM Consult  Post Acute Care Choice:    Choice offered to:     DME Arranged:    DME Agency:     HH Arranged:    HH Agency:     Status of Service:  In process, will continue to follow  If discussed at Long Length of Stay Meetings, dates discussed:    Additional Comments: will follow and assist as pt progresses. Explained role of cm. Hanley Haysowell, Che Below T, RN 04/01/2016, 11:50 AM

## 2016-04-01 NOTE — H&P (Signed)
History and Physical    Timothy Olsen ZOX:[id number] DOB: 05-29-1955 DOA: 04/02/2016   PCP: Boneta Lucks, NP Chief Complaint:  Chief Complaint  Patient presents with  . Generalized Body Aches    HPI: Timothy Olsen is a 61 y.o. male with medical history significant of CAD with ischemic cardiomyopathy, A.Fib previously in 2013.  TEE-DCCV to NSR in July of 2013, started Xarelto and amiodarone.  Looks like amiodarone was stopped due to bradycardia (see Dr. Rosalyn Charters 02/2012 note).  Also despite CHADS2 score of 3 he was apparently unable to tolerate anticoagulation due to severe epistaxis.  Despite this he has remained out of A.Fib up until this point.  Today the patient presents to the ED with c/o generalized body aches, abd discomfort, generalized weakness.  Patient fell and broke his shoulder on Feb 7th.  Has been walking around but developed "soreness all over" worsening over the past couple of days.   ED Course: A.Fib RVR to the 120s-130s.  Cardizem tried initially but made him hypotensive.  EDP called Cardiology and fellow had them start amiodarone load and gtt.  Review of Systems: As per HPI otherwise 10 point review of systems negative.    Past Medical History:  Diagnosis Date  . Alcohol abuse   . Atrial fibrillation (HCC)    CHADS2=3; Xarelto started 7/13; s/p TEE-DCCV => NSR;  patient back in AF with RVR at f/u 8/13 => amiodarone started => back in NSR 9/13 f/u OV  . CAD (coronary artery disease)    LHC 7/13: mOM1 50-75%, ostial subbranch 80-90% (very small), PL branch 80-90% (very small), mRCA 40-50%, EF 30% => med Rx for small vessel disease  . Chronic combined systolic and diastolic CHF (congestive heart failure) (HCC)   . Diabetes mellitus   . Epistaxis 08/2011   On heparin and Coumadin s/p silver nitrate cauterization L nare  . Gout   . Hyperlipidemia   . Hypertension   . Ischemic cardiomyopathy    echo 7/13: mild LVH, EF 25-30%, global HK with severe Hk to  AK of basal to mid inf and post walls, severe diast dysfxn, mild MR, mod LAE, mod reduced RV fxn, mod RAE, PASP 50 (mild to mod pulmo HTN);  TEE 7/13 with EF 30-35%  . MI (myocardial infarction)   . Tobacco abuse     Past Surgical History:  Procedure Laterality Date  . ADENOIDECTOMY    . CARDIAC CATHETERIZATION  09/09/2011   a) widely patent left main with minor luminal irregularities, patent LAD, mild prox LAD calcification and luminal irregularities, patent D1, diffuse prox and LCX- mid irregularity, 50-75% mid OM1 stenosis, 80-90% ostial stenosis in small subbranch, small PLB 80-90% stenosis, RCA- mild proximal luminal irregularity, mid 40-50% stenosis, distal irregularity, PDA and PLB patent; LVEF 30% and global L  . CARDIOVERSION  09/15/2011   Procedure: CARDIOVERSION;  Surgeon: Lewayne Bunting, MD;  Location: [facility] ENDOSCOPY;  Service: Cardiovascular;  Laterality: N/A;  . LEFT HEART CATHETERIZATION WITH CORONARY ANGIOGRAM N/A 09/09/2011   Procedure: LEFT HEART CATHETERIZATION WITH CORONARY ANGIOGRAM;  Surgeon: Tonny Bollman, MD;  Location: [facility] CATH LAB;  Service: Cardiovascular;  Laterality: N/A;  . left thumb surgery    . TEE WITHOUT CARDIOVERSION  09/15/2011   Procedure: TRANSESOPHAGEAL ECHOCARDIOGRAM (TEE);  Surgeon: Lewayne Bunting, MD;  Location: [facility] ENDOSCOPY;  Service: Cardiovascular;  Laterality: N/A;  . TONSILLECTOMY       reports that he has quit smoking. His smoking use included Cigarettes. He has  a 1.00 pack-year smoking history. He has never used smokeless tobacco. He reports that he drinks about 3.0 oz of alcohol per week . He reports that he does not use drugs.  No Known Allergies  Family History  Problem Relation Age of Onset  . Diabetes type II Mother   . Hypertension Father       Prior to Admission medications   Medication Sig Start Date End Date Taking? Authorizing Provider  allopurinol (ZYLOPRIM) 100 MG tablet Take 100 mg by mouth daily as needed (for gout flare  up).    Yes Historical Provider, MD  aspirin EC 81 MG tablet Take 81 mg by mouth 2 (two) times a week.  07/06/12  Yes Tonny Bollman, MD  CARTIA XT 240 MG 24 hr capsule TAKE ONE CAPSULE BY MOUTH DAILY 11/29/15  Yes Rosalio Macadamia, NP  colchicine 0.6 MG tablet Take 0.6-1.2 mg by mouth daily as needed (2 tabs at first sign or gout flare up than 1 tab an hour later).    Yes Historical Provider, MD  furosemide (LASIX) 40 MG tablet Is taking as needed Patient taking differently: Take 40 mg by mouth daily as needed for fluid. Is taking as needed 08/20/15  Yes Tonny Bollman, MD  glipiZIDE (GLUCOTROL) 5 MG tablet Take 5 mg by mouth 2 (two) times daily before a meal.     Yes Historical Provider, MD  hydrALAZINE (APRESOLINE) 50 MG tablet TAKE ONE TABLET BY MOUTH THREE TIMES DAILY Patient taking differently: TAKE ONE TABLET BY MOUTH TWICE TIMES DAILY 05/31/15  Yes Rosalio Macadamia, NP  HYDROcodone-acetaminophen (NORCO/VICODIN) 5-325 MG per tablet Take 1 tablet by mouth every 6 (six) hours as needed for moderate pain. 10/12/14  Yes Sherren Mocha, MD  indomethacin (INDOCIN) 25 MG capsule Take 25 mg by mouth daily as needed (gout flare up).    Yes Historical Provider, MD  lisinopril (PRINIVIL,ZESTRIL) 40 MG tablet TAKE ONE TABLET BY MOUTH ONCE DAILY 08/29/15  Yes Tonny Bollman, MD  loratadine (CLARITIN) 10 MG tablet Take 10 mg by mouth daily as needed for allergies.   Yes Historical Provider, MD  metFORMIN (GLUCOPHAGE) 1000 MG tablet Take 1,000 mg by mouth 2 (two) times daily with a meal. Sliding Scale 06/06/14  Yes Historical Provider, MD  metoprolol (LOPRESSOR) 50 MG tablet TAKE ONE TABLET BY MOUTH TWICE DAILY 12/10/15  Yes Tonny Bollman, MD  nitroGLYCERIN (NITROSTAT) 0.4 MG SL tablet Place 1 tablet (0.4 mg total) under the tongue every 5 (five) minutes as needed. For chest pain 10/16/14  Yes Sherren Mocha, MD  oxyCODONE-acetaminophen (PERCOCET/ROXICET) 5-325 MG tablet Take 1 tablet by mouth every 6 (six) hours as needed  for severe pain. 03/19/16  Yes Courteney Lyn Mackuen, MD  potassium chloride SA (K-DUR,KLOR-CON) 20 MEQ tablet Take 1 tablet (20 mEq total) by mouth daily. With lasix only Patient taking differently: Take 20 mEq by mouth daily as needed (lasix). With lasix only 10/16/14  Yes Sherren Mocha, MD  pravastatin (PRAVACHOL) 40 MG tablet Take 40 mg by mouth daily.  05/20/15  Yes Historical Provider, MD    Physical Exam: Vitals:   2016/04/21 2126 21-Apr-2016 2200 April 21, 2016 2300 2016/04/21 2330  BP: 99/73 99/84 (!) 86/72 101/83  Pulse:  (!) 147 (!) 58   Resp: 19 21 25  (!) 27  Temp:      TempSrc:      SpO2:  (!) 88% 96%   Weight:      Height:  Constitutional: NAD, calm, comfortable Eyes: PERRL, lids and conjunctivae normal ENMT: Mucous membranes are moist. Posterior pharynx clear of any exudate or lesions.Normal dentition.  Neck: normal, supple, no masses, no thyromegaly Respiratory: clear to auscultation bilaterally, no wheezing, no crackles. Normal respiratory effort. No accessory muscle use.  Cardiovascular: Regular rate and rhythm, no murmurs / rubs / gallops. No extremity edema. 2+ pedal pulses. No carotid bruits.  Abdomen: no tenderness, no masses palpated. No hepatosplenomegaly. Bowel sounds positive.  Musculoskeletal: no clubbing / cyanosis. No joint deformity upper and lower extremities. Good ROM, no contractures. Normal muscle tone.  Skin: no rashes, lesions, ulcers. No induration Neurologic: CN 2-12 grossly intact. Sensation intact, DTR normal. Strength 5/5 in all 4.  Psychiatric: Normal judgment and insight. Alert and oriented x 3. Normal mood.    Labs on Admission: I have personally reviewed following labs and imaging studies  CBC:  Recent Labs Lab November 17, 2016 1850  WBC 6.7  NEUTROABS 6.0  HGB 14.9  HCT 45.8  MCV 87.6  PLT 154   Basic Metabolic Panel:  Recent Labs Lab November 17, 2016 1850  NA 138  K 4.2  CL 106  CO2 20*  GLUCOSE 98  BUN 15  CREATININE 1.12  CALCIUM 9.0    GFR: Estimated Creatinine Clearance: 87.2 mL/min (by C-G formula based on SCr of 1.12 mg/dL). Liver Function Tests:  Recent Labs Lab November 17, 2016 1850  AST 23  ALT 15*  ALKPHOS 108  BILITOT 4.0*  PROT 5.5*  ALBUMIN 2.8*    Recent Labs Lab November 17, 2016 2003  LIPASE 13   No results for input(s): AMMONIA in the last 168 hours. Coagulation Profile: No results for input(s): INR, PROTIME in the last 168 hours. Cardiac Enzymes: No results for input(s): CKTOTAL, CKMB, CKMBINDEX, TROPONINI in the last 168 hours. BNP (last 3 results) No results for input(s): PROBNP in the last 8760 hours. HbA1C: No results for input(s): HGBA1C in the last 72 hours. CBG: No results for input(s): GLUCAP in the last 168 hours. Lipid Profile: No results for input(s): CHOL, HDL, LDLCALC, TRIG, CHOLHDL, LDLDIRECT in the last 72 hours. Thyroid Function Tests: No results for input(s): TSH, T4TOTAL, FREET4, T3FREE, THYROIDAB in the last 72 hours. Anemia Panel: No results for input(s): VITAMINB12, FOLATE, FERRITIN, TIBC, IRON, RETICCTPCT in the last 72 hours. Urine analysis:    Component Value Date/Time   COLORURINE AMBER (A) 10-01-16 2230   APPEARANCEUR HAZY (A) 10-01-16 2230   LABSPEC 1.035 (H) 10-01-16 2230   PHURINE 5.0 10-01-16 2230   GLUCOSEU NEGATIVE 10-01-16 2230   HGBUR NEGATIVE 10-01-16 2230   BILIRUBINUR MODERATE (A) 10-01-16 2230   BILIRUBINUR small (A) 01/12/2015 1451   BILIRUBINUR moderate 01/21/2014 1419   KETONESUR NEGATIVE 10-01-16 2230   PROTEINUR 100 (A) 10-01-16 2230   UROBILINOGEN 2.0 01/12/2015 1451   UROBILINOGEN 1.0 09/12/2010 1102   NITRITE NEGATIVE 10-01-16 2230   LEUKOCYTESUR NEGATIVE 10-01-16 2230   Sepsis Labs: @LABRCNTIP (procalcitonin:4,lacticidven:4) )No results found for this or any previous visit (from the past 240 hour(s)).   Radiological Exams on Admission: Dg Chest 2 View  Result Date: 05/02/2016 CLINICAL DATA:  Chronic bilateral lower  extremity pain and swelling. Initial encounter. EXAM: CHEST  2 VIEW COMPARISON:  Chest radiograph performed 03/19/2016 FINDINGS: The lungs are well-aerated. Mild bibasilar opacities may reflect atelectasis or possibly minimal interstitial edema. There is no evidence of pleural effusion or pneumothorax. The heart is enlarged. No acute osseous abnormalities are seen. Anterior bridging osteophytes are seen along the lower thoracic  spine. IMPRESSION: Cardiomegaly. Mild bibasilar opacities may reflect atelectasis or possibly minimal interstitial edema. Electronically Signed   By: Roanna Raider M.D.   On: 04/02/2016 19:28    EKG: Independently reviewed.  Assessment/Plan Principal Problem:   Atrial fibrillation with RVR (HCC) Active Problems:   Diabetes mellitus (HCC)   Ischemic cardiomyopathy    1. A.Fib RVR - 1. Cardizem dropped BP 2. So cards fellow had EDP start amiodarone load and gtt 3. Will admit to SDU 4. Tele monitor 5. Holding other BP meds at this point due to borderline BPs still (100s systolic at best). 6. Will get 2d echo to assess LVEF (if further  7. Will order heparin gtt for the moment, his CHADS-Vasc score is a 4 2. DM2 - 1. Holding metformin and glipizide 2. Mod scale SSI AC/HS 3. Ischemic cardiomyopathy - appears to be chronic and stable 1. Continue statin, ASA, ranexa 2. Trop in ED not elevated, no complaint of chest pain 3. Tele monitor   DVT prophylaxis: Heparin gtt Code Status: Full Family Communication: No family in room Consults called: EDP spoke with cards fellow Admission status: Admit to inpatient   Hillary Bow DO [facility] Pager [phone] from 7PM-7AM  If 7AM-7PM, please contact the day physician for the patient [email or web address] Password [name]  04/01/2016, 12:29 AM

## 2016-04-02 ENCOUNTER — Inpatient Hospital Stay (HOSPITAL_COMMUNITY): Payer: Medicare Other

## 2016-04-02 DIAGNOSIS — I4891 Unspecified atrial fibrillation: Secondary | ICD-10-CM

## 2016-04-02 DIAGNOSIS — J9601 Acute respiratory failure with hypoxia: Secondary | ICD-10-CM

## 2016-04-02 DIAGNOSIS — I739 Peripheral vascular disease, unspecified: Secondary | ICD-10-CM

## 2016-04-02 LAB — HEPARIN LEVEL (UNFRACTIONATED)
HEPARIN UNFRACTIONATED: 0.17 [IU]/mL — AB (ref 0.30–0.70)
Heparin Unfractionated: 0.28 IU/mL — ABNORMAL LOW (ref 0.30–0.70)

## 2016-04-02 LAB — COMPREHENSIVE METABOLIC PANEL
ALBUMIN: 2.4 g/dL — AB (ref 3.5–5.0)
ALK PHOS: 155 U/L — AB (ref 38–126)
ALT: 48 U/L (ref 17–63)
ALT: 51 U/L (ref 17–63)
ANION GAP: 18 — AB (ref 5–15)
AST: 127 U/L — ABNORMAL HIGH (ref 15–41)
AST: 132 U/L — ABNORMAL HIGH (ref 15–41)
Albumin: 1.9 g/dL — ABNORMAL LOW (ref 3.5–5.0)
Alkaline Phosphatase: 222 U/L — ABNORMAL HIGH (ref 38–126)
Anion gap: 16 — ABNORMAL HIGH (ref 5–15)
BILIRUBIN TOTAL: 5.8 mg/dL — AB (ref 0.3–1.2)
BUN: 31 mg/dL — AB (ref 6–20)
BUN: 36 mg/dL — ABNORMAL HIGH (ref 6–20)
CALCIUM: 8.4 mg/dL — AB (ref 8.9–10.3)
CO2: 17 mmol/L — AB (ref 22–32)
CO2: 14 mmol/L — ABNORMAL LOW (ref 22–32)
CREATININE: 1.87 mg/dL — AB (ref 0.61–1.24)
Calcium: 7.8 mg/dL — ABNORMAL LOW (ref 8.9–10.3)
Chloride: 101 mmol/L (ref 101–111)
Chloride: 101 mmol/L (ref 101–111)
Creatinine, Ser: 2.42 mg/dL — ABNORMAL HIGH (ref 0.61–1.24)
GFR calc Af Amer: 44 mL/min — ABNORMAL LOW (ref >60)
GFR calc Af Amer: 32 mL/min — ABNORMAL LOW (ref >60)
GFR calc non Af Amer: 38 mL/min — ABNORMAL LOW (ref >60)
GFR calc non Af Amer: 28 mL/min — ABNORMAL LOW (ref >60)
GLUCOSE: 20 mg/dL — AB (ref 65–99)
Glucose, Bld: 54 mg/dL — ABNORMAL LOW (ref 65–99)
Potassium: 4.4 mmol/L (ref 3.5–5.1)
Potassium: 4.7 mmol/L (ref 3.5–5.1)
SODIUM: 136 mmol/L (ref 135–145)
Sodium: 131 mmol/L — ABNORMAL LOW (ref 135–145)
TOTAL PROTEIN: 5.2 g/dL — AB (ref 6.5–8.1)
Total Bilirubin: 7.3 mg/dL — ABNORMAL HIGH (ref 0.3–1.2)
Total Protein: 4.4 g/dL — ABNORMAL LOW (ref 6.5–8.1)

## 2016-04-02 LAB — GLUCOSE, CAPILLARY
GLUCOSE-CAPILLARY: 72 mg/dL (ref 65–99)
GLUCOSE-CAPILLARY: 67 mg/dL (ref 65–99)
Glucose-Capillary: 16 mg/dL — CL (ref 65–99)
Glucose-Capillary: 226 mg/dL — ABNORMAL HIGH (ref 65–99)
Glucose-Capillary: <10 mg/dL — CL (ref 65–99)
Glucose-Capillary: 122 mg/dL — ABNORMAL HIGH (ref 65–99)
Glucose-Capillary: 220 mg/dL — ABNORMAL HIGH (ref 65–99)
Glucose-Capillary: 258 mg/dL — ABNORMAL HIGH (ref 65–99)
Glucose-Capillary: 54 mg/dL — ABNORMAL LOW (ref 65–99)

## 2016-04-02 LAB — BLOOD GAS, ARTERIAL
Acid-base deficit: 14.9 mmol/L — ABNORMAL HIGH (ref 0.0–2.0)
Bicarbonate: 11.4 mmol/L — ABNORMAL LOW (ref 20.0–28.0)
Drawn by: 10006
O2 Content: 4 L/min
O2 Saturation: 89.1 %
Patient temperature: 98.6
pCO2 arterial: 28.9 mmHg — ABNORMAL LOW (ref 32.0–48.0)
pH, Arterial: 7.218 — ABNORMAL LOW (ref 7.350–7.450)
pO2, Arterial: 72.2 mmHg — ABNORMAL LOW (ref 83.0–108.0)

## 2016-04-02 LAB — CBC
HCT: 35.6 % — ABNORMAL LOW (ref 39.0–52.0)
HCT: 43.4 % (ref 39.0–52.0)
Hemoglobin: 11.1 g/dL — ABNORMAL LOW (ref 13.0–17.0)
Hemoglobin: 13.8 g/dL (ref 13.0–17.0)
MCH: 28.2 pg (ref 26.0–34.0)
MCH: 28.4 pg (ref 26.0–34.0)
MCHC: 31.2 g/dL (ref 30.0–36.0)
MCHC: 31.8 g/dL (ref 30.0–36.0)
MCV: 90.4 fL (ref 78.0–100.0)
MCV: 89.3 fL (ref 78.0–100.0)
PLATELETS: DECREASED 10*3/uL (ref 150–400)
Platelets: 27 10*3/uL — CL (ref 150–400)
RBC: 3.94 MIL/uL — ABNORMAL LOW (ref 4.22–5.81)
RBC: 4.86 MIL/uL (ref 4.22–5.81)
RDW: 19 % — AB (ref 11.5–15.5)
RDW: 17.3 % — ABNORMAL HIGH (ref 11.5–15.5)
WBC: 2.3 10*3/uL — AB (ref 4.0–10.5)
WBC: 2.2 10*3/uL — ABNORMAL LOW (ref 4.0–10.5)

## 2016-04-02 LAB — BASIC METABOLIC PANEL
ANION GAP: 18 — AB (ref 5–15)
BUN: 33 mg/dL — AB (ref 6–20)
CHLORIDE: 102 mmol/L (ref 101–111)
CO2: 15 mmol/L — ABNORMAL LOW (ref 22–32)
Calcium: 8.2 mg/dL — ABNORMAL LOW (ref 8.9–10.3)
Creatinine, Ser: 1.94 mg/dL — ABNORMAL HIGH (ref 0.61–1.24)
GFR, EST AFRICAN AMERICAN: 42 mL/min — AB (ref >60)
GFR, EST NON AFRICAN AMERICAN: 36 mL/min — AB (ref >60)
Glucose, Bld: <20 mg/dL — CL (ref 65–99)
POTASSIUM: 5.1 mmol/L (ref 3.5–5.1)
Sodium: 135 mmol/L (ref 135–145)

## 2016-04-02 LAB — POCT I-STAT, CHEM 8
BUN: 37 mg/dL — ABNORMAL HIGH (ref 6–20)
CHLORIDE: 100 mmol/L — AB (ref 101–111)
Calcium, Ion: 1.09 mmol/L — ABNORMAL LOW (ref 1.15–1.40)
Creatinine, Ser: 2 mg/dL — ABNORMAL HIGH (ref 0.61–1.24)
HEMATOCRIT: 42 % (ref 39.0–52.0)
HEMOGLOBIN: 14.3 g/dL (ref 13.0–17.0)
POTASSIUM: 5.1 mmol/L (ref 3.5–5.1)
SODIUM: 134 mmol/L — AB (ref 135–145)
TCO2: 18 mmol/L (ref 0–100)

## 2016-04-02 LAB — URINE CULTURE

## 2016-04-02 LAB — COOXEMETRY PANEL
Carboxyhemoglobin: 1.4 % (ref 0.5–1.5)
Methemoglobin: 0.7 % (ref 0.0–1.5)
O2 Saturation: 63.2 %
TOTAL HEMOGLOBIN: 11.4 g/dL — AB (ref 12.0–16.0)

## 2016-04-02 LAB — LACTIC ACID, PLASMA
Lactic Acid, Venous: 6 mmol/L (ref 0.5–1.9)
Lactic Acid, Venous: 9 mmol/L (ref 0.5–1.9)

## 2016-04-02 LAB — MAGNESIUM: MAGNESIUM: 1.6 mg/dL — AB (ref 1.7–2.4)

## 2016-04-02 MED ORDER — NALOXONE HCL 0.4 MG/ML IJ SOLN
0.4000 mg | INTRAMUSCULAR | Status: AC
  Administered 2016-04-02: 0.4 mg via INTRAVENOUS
  Filled 2016-04-02: qty 1

## 2016-04-02 MED ORDER — DEXTROSE 50 % IV SOLN
1.0000 | Freq: Once | INTRAVENOUS | Status: AC
  Administered 2016-04-02: 50 mL via INTRAVENOUS

## 2016-04-02 MED ORDER — DEXTROSE 50 % IV SOLN
1.0000 | INTRAVENOUS | Status: AC
  Administered 2016-04-02 – 2016-04-03 (×4): 50 mL via INTRAVENOUS
  Filled 2016-04-02 (×4): qty 50

## 2016-04-02 MED ORDER — AMIODARONE HCL IN DEXTROSE 360-4.14 MG/200ML-% IV SOLN
60.0000 mg/h | INTRAVENOUS | Status: AC
  Administered 2016-04-02: 60 mg/h via INTRAVENOUS
  Filled 2016-04-02 (×2): qty 200

## 2016-04-02 MED ORDER — AMIODARONE HCL IN DEXTROSE 360-4.14 MG/200ML-% IV SOLN
INTRAVENOUS | Status: AC
  Filled 2016-04-02: qty 200

## 2016-04-02 MED ORDER — DEXTROSE 10 % IV SOLN
INTRAVENOUS | Status: DC
  Administered 2016-04-02: 40 mL/h via INTRAVENOUS

## 2016-04-02 MED ORDER — MAGNESIUM SULFATE 4 GM/100ML IV SOLN
4.0000 g | Freq: Once | INTRAVENOUS | Status: AC
  Administered 2016-04-02: 4 g via INTRAVENOUS
  Filled 2016-04-02: qty 100

## 2016-04-02 MED ORDER — AMIODARONE LOAD VIA INFUSION
150.0000 mg | Freq: Once | INTRAVENOUS | Status: AC
  Administered 2016-04-02: 150 mg via INTRAVENOUS

## 2016-04-02 MED ORDER — FUROSEMIDE 10 MG/ML IJ SOLN
160.0000 mg | Freq: Three times a day (TID) | INTRAVENOUS | Status: DC
  Administered 2016-04-02 – 2016-04-03 (×3): 160 mg via INTRAVENOUS
  Filled 2016-04-02 (×4): qty 16

## 2016-04-02 MED ORDER — METOLAZONE 2.5 MG PO TABS
2.5000 mg | ORAL_TABLET | Freq: Once | ORAL | Status: AC
  Administered 2016-04-02: 2.5 mg via ORAL
  Filled 2016-04-02: qty 1

## 2016-04-02 MED ORDER — VASOPRESSIN 20 UNIT/ML IV SOLN
0.0300 [IU]/min | INTRAVENOUS | Status: DC
  Filled 2016-04-02: qty 2

## 2016-04-02 MED ORDER — DEXTROSE 50 % IV SOLN
INTRAVENOUS | Status: AC
  Administered 2016-04-02: 50 mL
  Filled 2016-04-02: qty 50

## 2016-04-02 MED ORDER — MILRINONE LACTATE IN DEXTROSE 20-5 MG/100ML-% IV SOLN
0.2500 ug/kg/min | INTRAVENOUS | Status: DC
  Administered 2016-04-02: 0.25 ug/kg/min via INTRAVENOUS
  Filled 2016-04-02: qty 100

## 2016-04-02 MED ORDER — NOREPINEPHRINE BITARTRATE 1 MG/ML IV SOLN
2.0000 ug/min | INTRAVENOUS | Status: DC
  Administered 2016-04-02: 14 ug/min via INTRAVENOUS
  Filled 2016-04-02 (×2): qty 16

## 2016-04-02 MED ORDER — AMIODARONE LOAD VIA INFUSION
150.0000 mg | Freq: Once | INTRAVENOUS | Status: AC
Start: 2016-04-02 — End: 2016-04-02
  Administered 2016-04-02 (×2): 150 mg via INTRAVENOUS
  Filled 2016-04-02: qty 83.34

## 2016-04-02 MED ORDER — AMIODARONE HCL IN DEXTROSE 360-4.14 MG/200ML-% IV SOLN
30.0000 mg/h | INTRAVENOUS | Status: DC
  Administered 2016-04-02: 30 mg/h via INTRAVENOUS

## 2016-04-02 MED ORDER — HEPARIN (PORCINE) IN NACL 100-0.45 UNIT/ML-% IJ SOLN
2500.0000 [IU]/h | INTRAMUSCULAR | Status: DC
  Filled 2016-04-02: qty 250

## 2016-04-02 MED ORDER — METOLAZONE 5 MG PO TABS
5.0000 mg | ORAL_TABLET | Freq: Once | ORAL | Status: AC
Start: 2016-04-02 — End: 2016-04-02
  Administered 2016-04-02: 5 mg via ORAL
  Filled 2016-04-02: qty 1

## 2016-04-02 MED ORDER — DOPAMINE-DEXTROSE 3.2-5 MG/ML-% IV SOLN: 0.0000 ug/kg/min | INTRAVENOUS | Status: DC

## 2016-04-02 MED ORDER — DEXTROSE 50 % IV SOLN
INTRAVENOUS | Status: AC
  Filled 2016-04-02: qty 50

## 2016-04-02 MED ORDER — NOREPINEPHRINE BITARTRATE 1 MG/ML IV SOLN
2.0000 ug/min | INTRAVENOUS | Status: DC
  Administered 2016-04-02: 2 ug/min via INTRAVENOUS
  Administered 2016-04-02: 8 ug/min via INTRAVENOUS
  Filled 2016-04-02: qty 4

## 2016-04-02 MED ORDER — GLUCAGON HCL RDNA (DIAGNOSTIC) 1 MG IJ SOLR
1.0000 mg | Freq: Once | INTRAMUSCULAR | Status: AC
  Administered 2016-04-02: 1 mg via INTRAVENOUS
  Filled 2016-04-02: qty 1

## 2016-04-02 NOTE — Progress Notes (Signed)
ANTICOAGULATION CONSULT NOTE - FOLLOW UP    HL = 0.28 (goal 0.3 - 0.7 units/mL) Heparin dosing weight = 102 kg   Assessment: 60 YOM continues on IV heparin for Afib.  Heparin level is slightly below goal.  No bleeding nor issue with infusion per RN.     Plan: - Increase heparin gtt to 2500 units/hr - F/U AM labs    Vianny Schraeder D. Laney Potashang, PharmD, BCPS 04/02/2016, 10:34 PM

## 2016-04-02 NOTE — Progress Notes (Signed)
  Patient with clinical deterioration throughout the evening. Now obtunded on Bipap and max pressors.  On exam hs is dusky and cool. Nonresponsive.   Case discussed with Dr. Christene Slatese Dios and family at the bedside. There are no options left for him in case of multi-system organ failure.   Intubation or other support will be futile.  He has been made DNR/DNI with no titration of drips. Family not wanting to switch to comfort care at this point as they are waiting for one more family member.   They are aware that he will likely not survive the evening.   CCT is 35 minutes.   Michelangelo Rindfleisch,MD 11:03 PM

## 2016-04-02 NOTE — Progress Notes (Signed)
Pts HR remains elevated >150 on 60 mg/hr IV Amio. BP stable on levo. Pt alert and oriented.  Cardiology made aware, will continue to monitor.

## 2016-04-02 NOTE — Progress Notes (Addendum)
Hypoglycemic Event  CBG: 16 per lab stick  Treatment: 1 amp dextrose/ D10 gtt started Symptoms:lethargic but slowly answers questions Follow-up CBG: Time:8:27CBG Result:72 Possible Reasons for Event: Cardiogenic shock/poor circulation Comments/MD notified:Dr Bensimhon  Axel FillerLeary, Maison Kestenbaum [place]

## 2016-04-02 NOTE — Progress Notes (Signed)
ANTICOAGULATION CONSULT NOTE  Pharmacy Consult for Heparin Indication: atrial fibrillation  No Known Allergies  Patient Measurements: Height: 6\' 4"  (193 cm) Weight: 253 lb 12 oz (115.1 kg) IBW/kg (Calculated) : 86.8 Heparin Dosing Weight: 102 kg  Vital Signs: Temp: 97.8 F (36.6 C) (02/21 1155) Temp Source: Axillary (02/21 1155) BP: 89/77 (02/21 1000) Pulse Rate: 68 (02/21 1000)  Labs:  Recent Labs  03/23/2016 1850 04/01/16 0202  04/01/16 0835 04/01/16 1519 04/01/16 2315 04/02/16 0430 04/02/16 0738  HGB 14.9 14.6  --   --   --   --  11.1*  --   HCT 45.8 45.8  --   --   --   --  35.6*  --   PLT 154 172  --   --   --   --  PLATELET CLUMPS NOTED ON SMEAR, COUNT APPEARS DECREASED  --   HEPARINUNFRC  --   --   < >  --  0.25* <0.10*  --  0.17*  CREATININE 1.12  --   --  1.62* 1.78*  --   --  1.87*  < > = values in this interval not displayed.  Estimated Creatinine Clearance: 59 mL/min (by C-G formula based on SCr of 1.87 mg/dL (H)).   Assessment: 61 y.o. M presents with afib. To begin heparin gtt. Noted that pt has been tried on multiple agents (Coumadin and Xarelto) and unable to tolerate due to epistaxis.   Heparin level low this am on 2000 units/hr. No issues with bleeding reported. Will increase rate. Hgb is down this am to 11.1 will follow closely.   Goal of Therapy:  Heparin level 0.3-0.7 units/ml Monitor platelets by anticoagulation protocol: Yes   Plan:  Increase heparin to 2300 Recheck heparin level tonight with CBC  Sheppard CoilFrank Wilson PharmD., BCPS Clinical Pharmacist Pager [phone] 04/02/2016 12:10 PM

## 2016-04-02 NOTE — Progress Notes (Signed)
Pt maxed out on levophed at 50 mcg, SBP 70-80. MD paged awaiting new orders

## 2016-04-02 NOTE — Progress Notes (Signed)
Lab result CBG 20. Dr Sharon SellerMcClung informed. No repeat CBG at this time related to patient condition and inability to get blood from line and extremely hard stick.

## 2016-04-02 NOTE — Progress Notes (Signed)
Cardiology paged regarding pts morning labs and HR sustaining >150.

## 2016-04-02 NOTE — Progress Notes (Signed)
*  PRELIMINARY RESULTS* Vascular Ultrasound Bilateral Upper Extremity Arterial Duplex has been completed.   Study was very technically difficult and limited due to poor patient cooperation and agitation. There is no obvious evidence of occlusion or hemodynamically significant stenosis involving the visualized arteries of bilateral upper extremities.  04/02/2016 12:44 PM Gertie FeyMichelle Broxton Broady, BS, RVT, RDCS, RDMS

## 2016-04-02 NOTE — Significant Event (Addendum)
RN called, Pt started to have HR in 170-190 on milrinone. Given bolus of amiodarone and increased drip to 1 mg/hr for 6 hours without good response. BP MAP in 50s. Still no urine output after total of 180 mg of IV lasix. Bumex IV on shortage. Will give metolazone for now and if pt did not respond in 2-3 hours, will consider bumex PO. Decreased dose of milrinone to 0.25 mg and added levophed. Patient is probably in cardiogenic shock.

## 2016-04-02 NOTE — Progress Notes (Signed)
Advanced Heart Failure Rounding Note   Subjective:    Admitted with recurrent HF. Echo shows LVEF 10-15% with severe RV dysfunction as well. Yesterday developed cardiogenic shock with AKI started on milrinone.  Cr 1.1->1.8 with no response to 40mg  IV lasix and minimal urine output. Bilirubin 4.0. He has chronic AF but rate now up in 150-160 range despite IV amio. He has refused AC in past.   Now on milrinone 0.25 and norepi 4. Co-ox 63%. Did not make any urine yesterday but this am 200 cc out.   AF remains fast. Tolerating heparin and IV amio. Says he can't see that well but no problems on exam      Objective:   Weight Range:  Vital Signs:   Temp:  [97.2 F (36.2 C)-98.7 F (37.1 C)] 97.3 F (36.3 C) (02/21 0415) Pulse Rate:  [28-165] 34 (02/21 0600) Resp:  [19-36] 23 (02/21 0600) BP: (79-167)/(47-143) 167/143 (02/21 0600) SpO2:  [81 %-100 %] 95 % (02/21 0600) Weight:  [253 lb 12 oz (115.1 kg)] 253 lb 12 oz (115.1 kg) (02/21 0415) Last BM Date: 04/01/16  Weight change: Filed Weights   04/30/2016 1823 04/01/16 0325 04/02/16 0415  Weight: 225 lb (102.1 kg) 241 lb 1.6 oz (109.4 kg) 253 lb 12 oz (115.1 kg)    Intake/Output:   Intake/Output Summary (Last 24 hours) at 04/02/16 0700 Last data filed at 04/02/16 0500  Gross per 24 hour  Intake          1344.41 ml  Output              120 ml  Net          1224.41 ml     Physical Exam: CVP 21  General:  Fatigued. Sitting up in bed HEENT: normal poor dentition Neck: supple. JVP to jaw.  Carotids 2+ bilat; no bruits. No lymphadenopathy or thryomegaly appreciated. Cor: PMI nondisplaced.Tachy Irregular rate & rhythm. No rubs, gallops or murmurs. Lungs: Decreased. On 2 liters.  Abdomen: soft, nontender, nondistended. No hepatosplenomegaly. No bruits or masses. Good bowel sounds. Extremities: no cyanosis, clubbing, rash,  R and LLE  3+ edema  Neuro: alert & orientedx3, cranial nerves grossly intact. moves all 4 extremities  w/o difficulty. Affect pleasant  Telemetry:  A fib RVR 150-160s   Labs: Basic Metabolic Panel:  Recent Labs Lab April 30, 2016 1850 04/01/16 0835 04/01/16 1519 04/02/16 0430  NA 138 139 136  --   K 4.2 4.8 4.9  --   CL 106 106 101  --   CO2 20* 20* 19*  --   GLUCOSE 98 62* 52*  --   BUN 15 22* 27*  --   CREATININE 1.12 1.62* 1.78*  --   CALCIUM 9.0 9.0 9.0  --   MG  --  1.5*  --  1.6*    Liver Function Tests:  Recent Labs Lab 30-Apr-2016 1850  AST 23  ALT 15*  ALKPHOS 108  BILITOT 4.0*  PROT 5.5*  ALBUMIN 2.8*    Recent Labs Lab April 30, 2016 2003  LIPASE 13   No results for input(s): AMMONIA in the last 168 hours.  CBC:  Recent Labs Lab 30-Apr-2016 1850 04/01/16 0202 04/02/16 0430  WBC 6.7 7.7 2.3*  NEUTROABS 6.0  --   --   HGB 14.9 14.6 11.1*  HCT 45.8 45.8 35.6*  MCV 87.6 87.7 90.4  PLT 154 172 PENDING    Cardiac Enzymes: No results for input(s): CKTOTAL, CKMB, CKMBINDEX, TROPONINI  in the last 168 hours.  BNP: BNP (last 3 results)  Recent Labs  04/02/2016 2003  BNP 1,089.5*    ProBNP (last 3 results) No results for input(s): PROBNP in the last 8760 hours.    Other results:  Imaging: Dg Chest 2 View  Result Date: 04/05/2016 CLINICAL DATA:  Chronic bilateral lower extremity pain and swelling. Initial encounter. EXAM: CHEST  2 VIEW COMPARISON:  Chest radiograph performed 03/19/2016 FINDINGS: The lungs are well-aerated. Mild bibasilar opacities may reflect atelectasis or possibly minimal interstitial edema. There is no evidence of pleural effusion or pneumothorax. The heart is enlarged. No acute osseous abnormalities are seen. Anterior bridging osteophytes are seen along the lower thoracic spine. IMPRESSION: Cardiomegaly. Mild bibasilar opacities may reflect atelectasis or possibly minimal interstitial edema. Electronically Signed   By: Roanna RaiderJeffery  Chang M.D.   On: 03/22/2016 19:28   Dg Chest Port 1 View  Result Date: 04/01/2016 CLINICAL DATA:  PICC line  placement. History of ischemic cardiomyopathy. EXAM: PORTABLE CHEST 1 VIEW COMPARISON:  Chest radiograph March 31, 2016 FINDINGS: Cardiomegaly with globular configuration. Mediastinal silhouette is nonsuspicious. RIGHT PICC distal tip projects in RIGHT atrium. Mild pulmonary vascular congestion without pleural effusion or focal consolidation. No pneumothorax. Soft tissue planes and included osseous structures are unchanged. IMPRESSION: RIGHT PICC distal tip projects in RIGHT atrium, recommend 2 cm retraction. Stable cardiomegaly, and considering globular appearance, pericardial effusion is possible. Mild pulmonary vascular congestion. Electronically Signed   By: Awilda Metroourtnay  Bloomer M.D.   On: 04/01/2016 21:18   Dg Toe 2nd Right  Result Date: 04/01/2016 CLINICAL DATA:  Right second toe sore. EXAM: RIGHT SECOND TOE COMPARISON:  None. FINDINGS: Joint space loss in the second toe DIP joint and there may be ankylosis at this DIP joint. Negative for an acute fracture. Irregularity of the first metatarsal head which is concerning for erosive disease. Evidence for vascular calcifications. IMPRESSION: Evidence for erosive changes at the first metacarpal head. Findings are concerning for an inflammatory arthropathy and possibly gout. Joint space narrowing or ankylosis at the second toe DIP joint without acute bone abnormality. Electronically Signed   By: Richarda OverlieAdam  Henn M.D.   On: 04/01/2016 14:49      Medications:     Scheduled Medications: . [START ON 05/19/2016] aspirin EC  81 mg Oral Once per day on Mon Thu  . furosemide  120 mg Intravenous BID  . insulin aspart  0-15 Units Subcutaneous TID WC  . mupirocin cream   Topical Daily  . pravastatin  40 mg Oral q1800     Infusions: . amiodarone 60 mg/hr (04/02/16 0244)  . heparin 2,000 Units/hr (04/02/16 0200)  . milrinone 0.25 mcg/kg/min (04/02/16 0200)  . norepinephrine (LEVOPHED) Adult infusion 2 mcg/min (04/02/16 0600)     PRN  Medications:  acetaminophen, colchicine, loratadine, ondansetron (ZOFRAN) IV, oxyCODONE-acetaminophen, sodium chloride flush   Assessment:   1. Cardiogenic shock 2. A/c biventricular systolic HF     --LVEF 10-15% by echo 2/20 3. AKI 4. Chronic AF now with RVR 5. ETOH abuse 6. CAD 7. DM2   Plan/Discussion:    See below.   Length of Stay: 1   Amy Clegg 04/02/2016, 7:00 AM  Advanced Heart Failure Team Pager [phone] (M-F; 7a - 4p)  Please contact [facility] for night-coverage after hours (4p -7a ) and weekends on [email or web address]  Patient seen and examined with Tonye BecketAmy Clegg, NP. We discussed all aspects of the encounter. I agree with the assessment and plan as stated above.  He remains critically ill. Co-ox now up on dual pressors. Will continue. Will increase lasix to 160 IV q8. Await BMET. Hopefully renal function will improve. Not candidate for HD given overall situation. Doubt he would agree with short-term CVVHD either.   AF remains fast despite IV amio. Need to control rate. He is on heparin and could consider trial of TEE/DC-CV but unlikely to be effective due to long-standing AF. Does not want AVN ablation and CRT. I will d/w Dr. Excell Seltzer.   Overall I am very concerned about his prognosis and we may be headed toward palliative care being the best option. Continue milrinone and norepi today.   The patient is critically ill with multiple organ systems failure and requires high complexity decision making for assessment and support, frequent evaluation and titration of therapies, application of advanced monitoring technologies and extensive interpretation of multiple databases.   Critical Care Time devoted to patient care services described in this note is 35 Minutes.  Bensimhon, Daniel,MD 7:22 AM

## 2016-04-02 NOTE — Progress Notes (Signed)
ANTICOAGULATION CONSULT NOTE  Pharmacy Consult for Heparin Indication: atrial fibrillation  No Known Allergies  Patient Measurements: Height: 6\' 4"  (193 cm) Weight: 241 lb 1.6 oz (109.4 kg) IBW/kg (Calculated) : 86.8 Heparin Dosing Weight: 102 kg  Vital Signs: Temp: 98.2 F (36.8 C) (02/20 2300) Temp Source: Oral (02/20 2300) BP: 88/67 (02/20 2300) Pulse Rate: 89 (02/20 2300)  Labs:  Recent Labs  04/04/2016 1850 04/01/16 0202 04/01/16 0728 04/01/16 0835 04/01/16 1519 04/01/16 2315  HGB 14.9 14.6  --   --   --   --   HCT 45.8 45.8  --   --   --   --   PLT 154 172  --   --   --   --   HEPARINUNFRC  --   --  0.21*  --  0.25* <0.10*  CREATININE 1.12  --   --  1.62* 1.78*  --     Estimated Creatinine Clearance: 60.5 mL/min (by C-G formula based on SCr of 1.78 mg/dL (H)).   Assessment: 61 y.o. M presents with afib. To begin heparin gtt. Noted that pt has been tried on multiple agents (Coumadin and Xarelto) and unable to tolerate due to epistaxis.  Heparin level down to undetectable on 2000 units/hr. No issues with bleeding reported per RN. Heparin line was off earlier in shift for PICC placement and heparin currently not running as pt pulled out line - line pulled out after heparin level drawn. RN to replace line now. With heparin off part of time, suspect level is falsely low.  Goal of Therapy:  Heparin level 0.3-0.7 units/ml Monitor platelets by anticoagulation protocol: Yes   Plan:  -Continue heparin at 2000 units/hr and will f/u 6 hr heparin level after restart  Christoper Fabianaron Seon Gaertner, PharmD, BCPS Clinical pharmacist, pager [phone]  04/02/2016 12:21 AM

## 2016-04-02 NOTE — Progress Notes (Signed)
Lab up to stick patient. 3 attempts made and small amount of blood collected in peds tube for stat bmet. Istat ran as well and CBG 20. Patient alert and knows he is in the hospital. He is mentating the same as he has all day. Dextrose 1 amp given to cover sugar until BMET is resulted in lab. D10 continues to run at 40cc/hr. Tonye BecketAmy Clegg NP up to see patient. Pt otherwise asymptomatic at this time.

## 2016-04-02 NOTE — Progress Notes (Signed)
   [facility]  - chart review done.  - Sister and brother updated at bedside re: over all poor prognosis.   Family has decided to make pt DNR.  Sister will touch base with pt's father in am and discuss GOC with father in am.  - Will hold off on intubation - reassess GOC in am. - Dr.Bensimhon aware of changes in code status.  - call back if with issues.    Pollie MeyerJ. Angelo A de Dios, MD 04/02/2016, 10:58 PM [facility] Pager [phone] After 3 pm or if no answer, call [phone]

## 2016-04-02 NOTE — Progress Notes (Signed)
On assessment pt obtunded, not responsive to sternal rub. MD paged. Pts sister updated, she is on the way to room to see pt. VSS, MD on the way to room

## 2016-04-02 NOTE — Progress Notes (Signed)
[facility] TEAM 1 - Stepdown/ICU TEAM  Timothy Olsen  WUJ:[id number] DOB: 1955/02/22 DOA: 04/09/2016 PCP: Boneta Lucks, NP    Brief Narrative:  61 y.o. male with history of HTN, EtOH abuse, DM, HLD, diffuse small vessel disease/CAD with ischemic cardiomyopathy, and chronic Afib who presented to the ED with c/o generalized body aches, abd discomfort, and generalized weakness.  Patient fell and broke his shoulder on Feb 7th.    In the ED he was noted to be in A.Fib w/ RVR to the 120s-130s.   Subjective: The patient is awake but very confused at present.  He cannot provide a reliable history.  There is no family present at the time of visit.  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 - now End Stage CHF / Cardiogenic shock CHF Team attending to all cardiac issues - EF 10-15% w/ severe RV dysfxn  Acute kidney failure  Not felt to be a candidate for hemodialysis  Recent Labs Lab 03/28/2016 1850 04/01/16 0835 04/01/16 1519 04/02/16 0738  CREATININE 1.12 1.62* 1.78* 1.87*    Chronic Afib w/ acute RVR Care per Cardiology  DM2 w/ refractory hypoglycemia  likely due to glucotrol use prior to admit in setting of acute renal failure/anuria - utilize D50 and D5/D10 IVF as necessary, with all attempts to be made to minimize volume   EtOH abuse   Hx of HTN  Non-healing open wound of toe    DVT prophylaxis: VI heparin  Code Status: FULL CODE Family Communication: no family present at time of exam  Disposition Plan: ICU care under CHF Team - appears pt will not survive this hospital stay   Consultants:  CHF Team  Palliative Care   Procedures: TTE 2/20  Antimicrobials:  none  Objective: Blood pressure (!) 89/77, pulse 68, temperature 97.6 F (36.4 C), temperature source Axillary, resp. rate (!) 33, height 6\' 4"  (1.93 m), weight 115.1 kg (253 lb 12 oz), SpO2 96 %.  Intake/Output Summary (Last 24 hours) at 04/02/16 1057 Last  data filed at 04/02/16 1000  Gross per 24 hour  Intake          1512.78 ml  Output              120 ml  Net          1392.78 ml   Filed Weights   03/13/2016 1823 04/01/16 0325 04/02/16 0415  Weight: 102.1 kg (225 lb) 109.4 kg (241 lb 1.6 oz) 115.1 kg (253 lb 12 oz)    Examination: General: No acute respiratory distress evident - confused - lethargic  Lungs: poor air movement th/o - no wheeze  Cardiovascular: distant HS  Abdomen: Nontender, nondistended, soft Extremities: severe 3+ pitting edema B LE to pelvis  CBC:  Recent Labs Lab 03/30/2016 1850 04/01/16 0202 04/02/16 0430  WBC 6.7 7.7 2.3*  NEUTROABS 6.0  --   --   HGB 14.9 14.6 11.1*  HCT 45.8 45.8 35.6*  MCV 87.6 87.7 90.4  PLT 154 172 PLATELET CLUMPS NOTED ON SMEAR, COUNT APPEARS DECREASED   Basic Metabolic Panel:  Recent Labs Lab 04/01/2016 1850 04/01/16 0835 04/01/16 1519 04/02/16 0430 04/02/16 0738  NA 138 139 136  --  136  K 4.2 4.8 4.9  --  4.4  CL 106 106 101  --  101  CO2 20* 20* 19*  --  17*  GLUCOSE 98 62* 52*  --  20*  BUN 15 22* 27*  --  31*  CREATININE 1.12 1.62* 1.78*  --  1.87*  CALCIUM 9.0 9.0 9.0  --  8.4*  MG  --  1.5*  --  1.6*  --    GFR: Estimated Creatinine Clearance: 59 mL/min (by C-G formula based on SCr of 1.87 mg/dL (H)).  Liver Function Tests:  Recent Labs Lab 04-06-16 1850 04/02/16 0738  AST 23 127*  ALT 15* 48  ALKPHOS 108 155*  BILITOT 4.0* 5.8*  PROT 5.5* 5.2*  ALBUMIN 2.8* 2.4*    Recent Labs Lab 06-Apr-2016 2003  LIPASE 13    HbA1C: Hemoglobin A1C  Date/Time Value Ref Range Status  10/12/2014 01:44 PM 6.1  Final   Hgb A1c MFr Bld  Date/Time Value Ref Range Status  09/09/2011 02:37 AM 6.9 (H) <5.7 % Final    Comment:    (NOTE)                                                                       According to the ADA Clinical Practice Recommendations for 2011, when HbA1c is used as a screening test:  >=6.5%   Diagnostic of Diabetes Mellitus            (if abnormal result is confirmed) 5.7-6.4%   Increased risk of developing Diabetes Mellitus References:Diagnosis and Classification of Diabetes Mellitus,Diabetes Care,2011,34(Suppl 1):S62-S69 and Standards of Medical Care in         Diabetes - 2011,Diabetes Care,2011,34 (Suppl 1):S11-S61.  01/21/2011 12:00 PM 5.8 (H) <5.7 % Final    Comment:    (NOTE)                                                                       According to the ADA Clinical Practice Recommendations for 2011, when HbA1c is used as a screening test:  >=6.5%   Diagnostic of Diabetes Mellitus           (if abnormal result is confirmed) 5.7-6.4%   Increased risk of developing Diabetes Mellitus References:Diagnosis and Classification of Diabetes Mellitus,Diabetes Care,2011,34(Suppl 1):S62-S69 and Standards of Medical Care in         Diabetes - 2011,Diabetes Care,2011,34 (Suppl 1):S11-S61.    CBG:  Recent Labs Lab 04/01/16 1455 04/01/16 1653 04/01/16 2220 04/02/16 0750 04/02/16 0827  GLUCAP 16* <10* 30* 16* 72    Recent Results (from the past 240 hour(s))  MRSA PCR Screening     Status: None   Collection Time: 04/01/16  2:52 AM  Result Value Ref Range Status   MRSA by PCR NEGATIVE NEGATIVE Final    Comment:        The GeneXpert MRSA Assay (FDA approved for NASAL specimens only), is one component of a comprehensive MRSA colonization surveillance program. It is not intended to diagnose MRSA infection nor to guide or monitor treatment for MRSA infections.      Scheduled Meds: . [START ON 03/25/2016] aspirin EC  81 mg Oral Once per day on Mon Thu  . furosemide  160  mg Intravenous Q8H  . insulin aspart  0-15 Units Subcutaneous TID WC  . magnesium sulfate 1 - 4 g bolus IVPB  4 g Intravenous Once  . mupirocin cream   Topical Daily  . pravastatin  40 mg Oral q1800   Continuous Infusions: . amiodarone 60 mg/hr (04/02/16 0819)  . dextrose 40 mL/hr (04/02/16 0800)  . heparin 2,300 Units/hr (04/02/16  0933)  . milrinone 0.25 mcg/kg/min (04/02/16 0817)  . norepinephrine (LEVOPHED) Adult infusion 10 mcg/min (04/02/16 1000)     LOS: 1 day   Lonia BloodJeffrey T. Claressa Hughley, MD [facility] Office  [phone] Pager - Text Page per Amion as per below:  On-Call/Text Page:      [email or web address]      password [email or web address]  If 7PM-7AM, please contact night-coverage [email or web address] Password [email or web address] 04/02/2016, 10:57 AM

## 2016-04-10 NOTE — Progress Notes (Signed)
At 2:20 am this RN and Aleen CampiKristin Lanier, RN auscultated for one minute. No heart or lung sounds present on auscultation. Family and MD notified of death.

## 2016-04-10 NOTE — Progress Notes (Signed)
Pt apneic, oxygen levels in the 80's, Family called with update and aware that death is likely, MD also notified

## 2016-04-10 NOTE — Discharge Summary (Signed)
  Advanced Heart Failure Death Summary  Death Summary   Patient ID: Timothy Olsen MRN: [id number], DOB/AGE: 1955-02-14 61 y.o. Admit date: 2016/11/05 D/C date:    04/07/2016    Primary Discharge Diagnoses:  1. Cardiogenic shock 2. A/c biventricular systolic HF  --LVEF 10-15% by echo 2/20 3. AKI 4. A Fib RVR 5. Lactic Acidosis 6. ETOH abuse 7. CAD 8. DM2 9. Acute respiratory failure   Hospital Course:  Timothy Olsen is a 61 y.o. male with history of CAD with ischemic cardiomyopathy, chronic A.Fib and ETOH abuse.    Admitted with recurrent A/C systolic heart failure and A Fib RVR. Echo completed and showed severely reduced LVEF 10-15% and severe RV dysfunction. He developed cardiogenic shock with AKI. Attempted to diurese with high dose IV lasix however he had minimal urine ouput. Creatinine rose from 1.1 and peaked at 2.4. He was placed on milrinone and norepi but had no significant improvement. SBP remained low. As he worsened norepi was maxed out at 50 mcg with no improvement in BP.   He remained in A fib RVR (heart rate 150-160s) and he was started on amiodarone and heparin drip. He remained on amio drip with many IV boluses with no improvement. Due to the duration of his AF and severe LAE and need for pressors it was felt that DC-CV would be unsuccessful.  Hospital course was also complicated by hypoglycemia. Glucose dropped to 14 and required D50 with D10 drip. On February 21st , he worsened with decreased LOC and was minimally responsive to sternal rub. He developed profound lactic acidosis (9). Placed on Bipap but continued to decline. Dr Gala RomneyBensimhon and Dr. Christene Slatese Dios of [facility] were at the bedside and discussed the severity of condition. Due to multisystem organ failure, the family elected to transition to DNR/DNI. On February 22nd he passed with family at his bedside.    Duration of Discharge Encounter: Greater than 35 minutes   Signed, Tonye BecketAmy Clegg, NP 04/04/2016, 3:16 PM    Patient seen and examined with Tonye BecketAmy Clegg, NP. We discussed all aspects of the encounter. I agree with the summary as stated above.   Timothy Caban,MD 2:43 PM

## 2016-04-10 NOTE — Progress Notes (Addendum)
Shift event note: Notified by RN just after shift change that pt now unresponsive. Minimal to no response to deep sternal rub which is a change from earlier this afternoon. VSS. According to current RN, day-shift RN reported to her that at approx 1600 pt had a hypoglycemic event that seemed to respond to an amp of D50. 1-2 hrs later pt had another hypoglycemic event but when D50 IV repeated pt did not return to baseline mentation. RN denies noting any seizure-like activity. Orders placed for stat ABG, IV narcan and repeat CBG. At bedside pt noted minimally responsive. Will groan, open eyes briefly, slightly grimace and attempt to pull away with deep sternal rub (which is only slightly improved from initial assessment and after IV Narcan) but doesnot keep eyes open and will only groan or mumble in response to verbal stimuli. Does not follow any commands. Pupils 5(L) and 4(R) and sluggish which per RN is baseline. GCS currently 8. Sister (at bedside) states pt was conversing with her earlier this afternoon. BBS very diminished but no noted wheezes. RR 30's. 02 sats 96-100% on 4L [place]. Pt remains in A-fib w/ RVR, HR 120-130's. Remains on Amiodarone qtt. BP has been trending down despite near maxed out IV Levophed as well as milrinone qtts. Last temp 99.7. CBG 122 after an amp of D50 for initial CBG of 67 . ABG > 7.21/28.9/72/11.4. Stat CBC, CMP, and lactate pending.  Assessment/Plan: 1. Decreased LOC: Etiology unclear though felt related to sequale of overwhelming multi-organ system failure. Ct head considered but pt not stable to travel to CT at this point.  2. Persistent hypotension: In setting of severe RV dysfunction, cardiogenic shock and persistent A-fib w/ RVR on Amio and milrinone qtt. Will add Dopamine qtt for now as Levophed is currently infusing at highest recommended dose w/ SBP in the 70-80's . Cardiology has been notified and are coming to evaluate pt tonight. Defer further management of persistent a-fib  w/ RVR and hypotension to cardiology service.  3. Persistent/worsening metabolic acidosis: in setting of  AKI and multi-organ failure. Discussed pt w/ Dr Craige CottaSood w/ [facility] as pt remains Full Code. Recommended placing pt on BiPAP for now and readdress code status w/ family as further aggressive measures/resusitation would be futile at this point.  Discussed w/ family at length regarding pt's very poor prognosis and likely actively dying. Encouraged family to consider a comfort focused approach to care at this point as any further interventions would not improve his condition and would likely add to his suffering. After extended discussion it is clear there appears to be some disagreement and negative dynamics between sister "Margaretha GlassingLoretta" and multiple other step brothers and cousins. The plan for now is to continue current interventions until family has a chance to talk w/ other siblings and pt's father.  Dr Craige CottaSood w/ [facility] updated. Will continue to monitor closely in ICU.  Leanne ChangKatherine P. Leaira Fullam, NP-C [facility] Pager [phone]

## 2016-04-10 DEATH — deceased

## 2018-04-19 IMAGING — CR DG SHOULDER 2+V*L*
3 series · 3 of 3 positions shown · non-contrast
Comparison: None.

CLINICAL DATA: Left shoulder pain after fall.

EXAM:
LEFT SHOULDER - 2+ VIEW

[shoulder grashey]
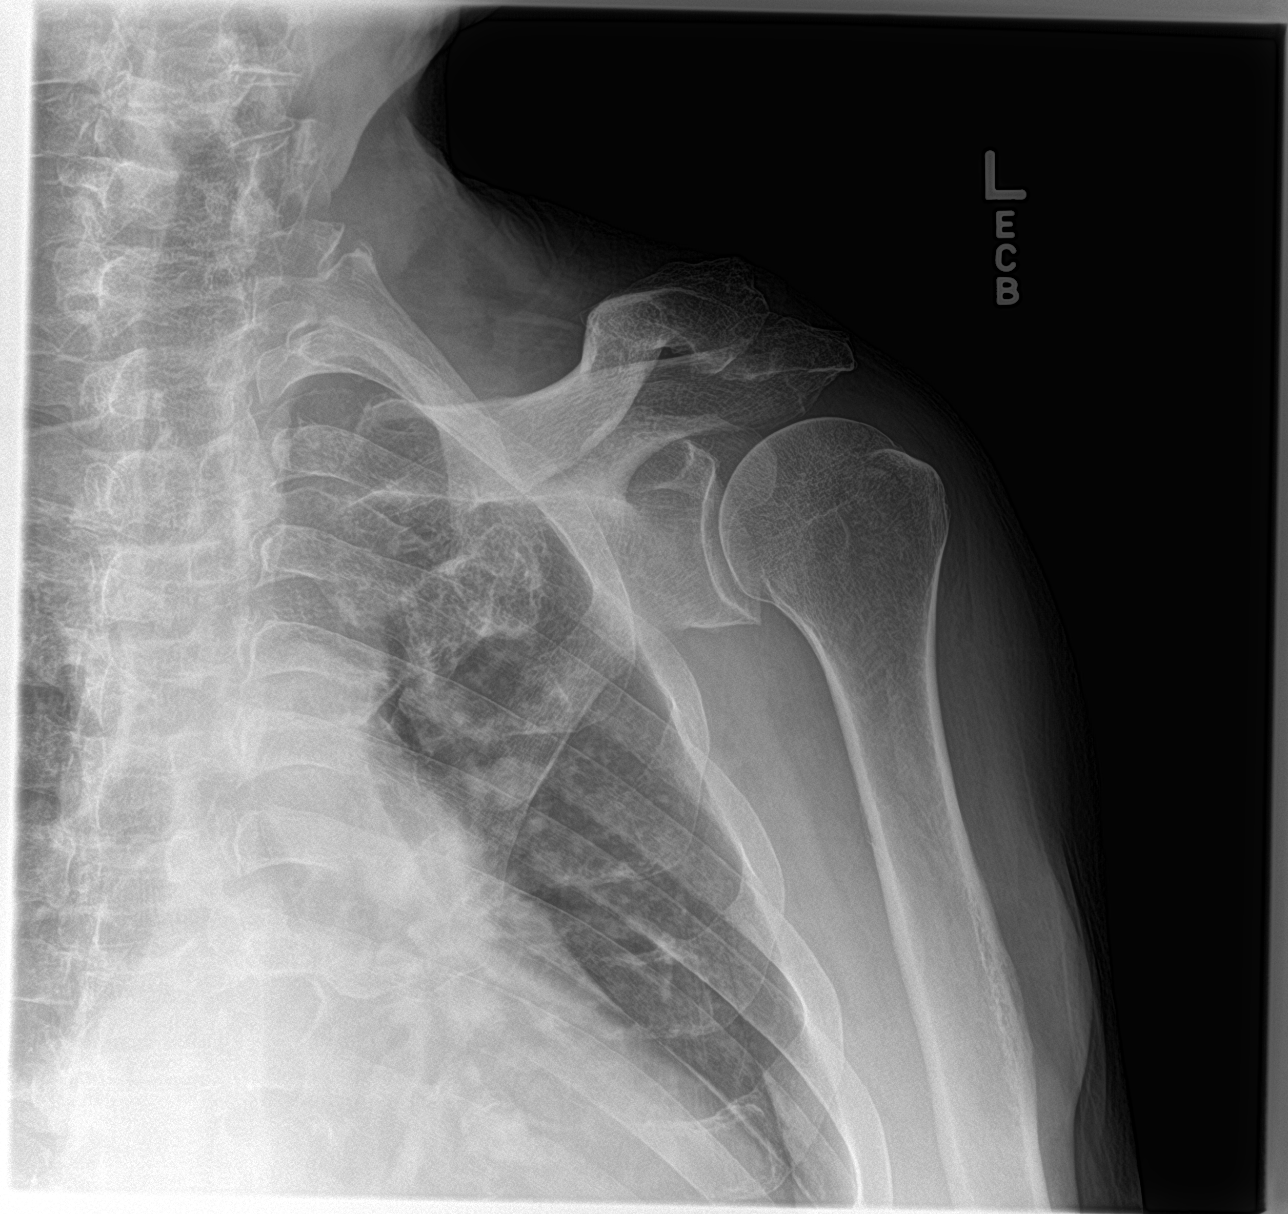

[shoulder y view]
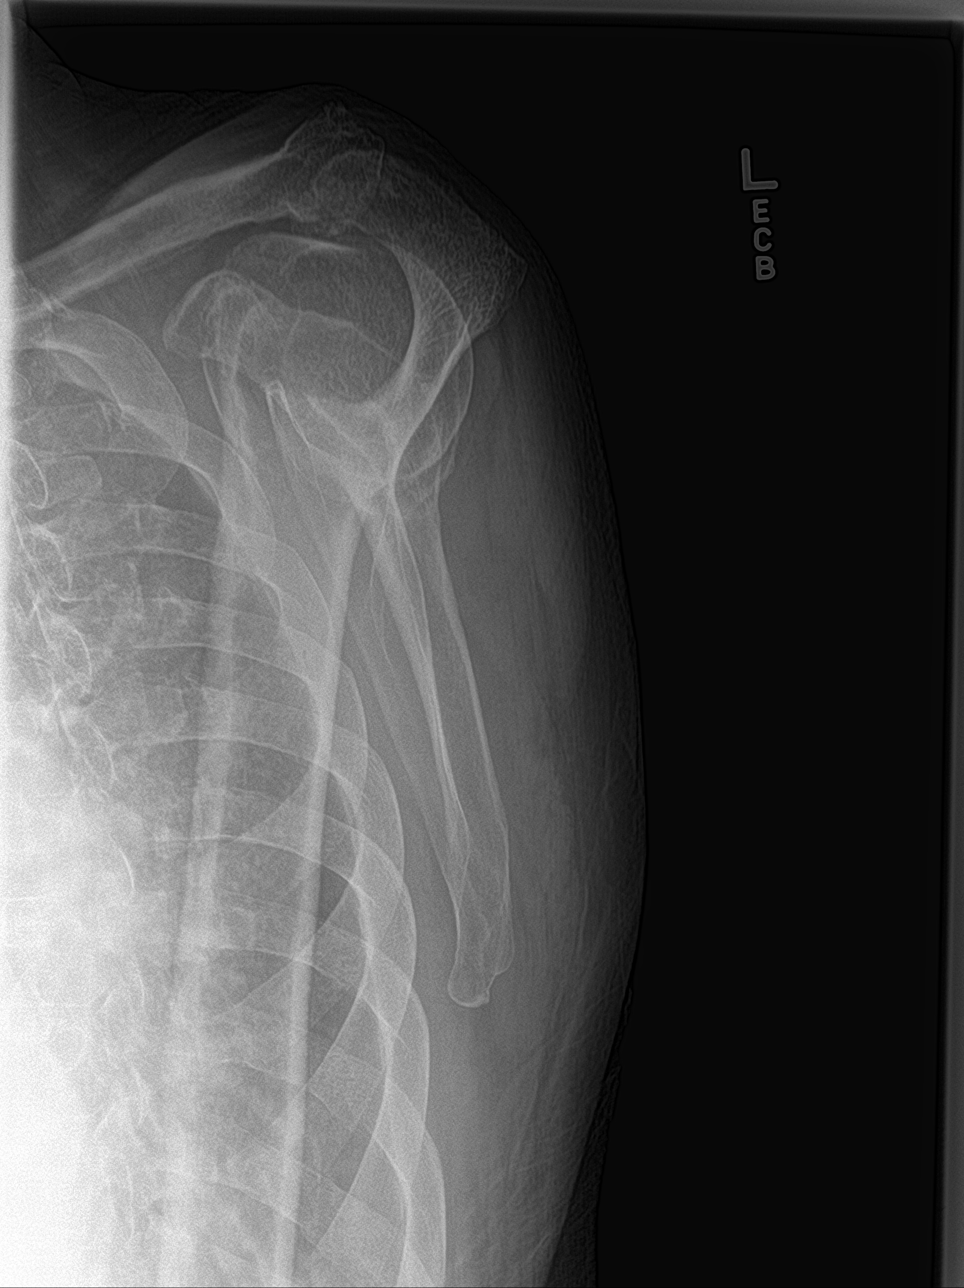

[shoulder axillary]
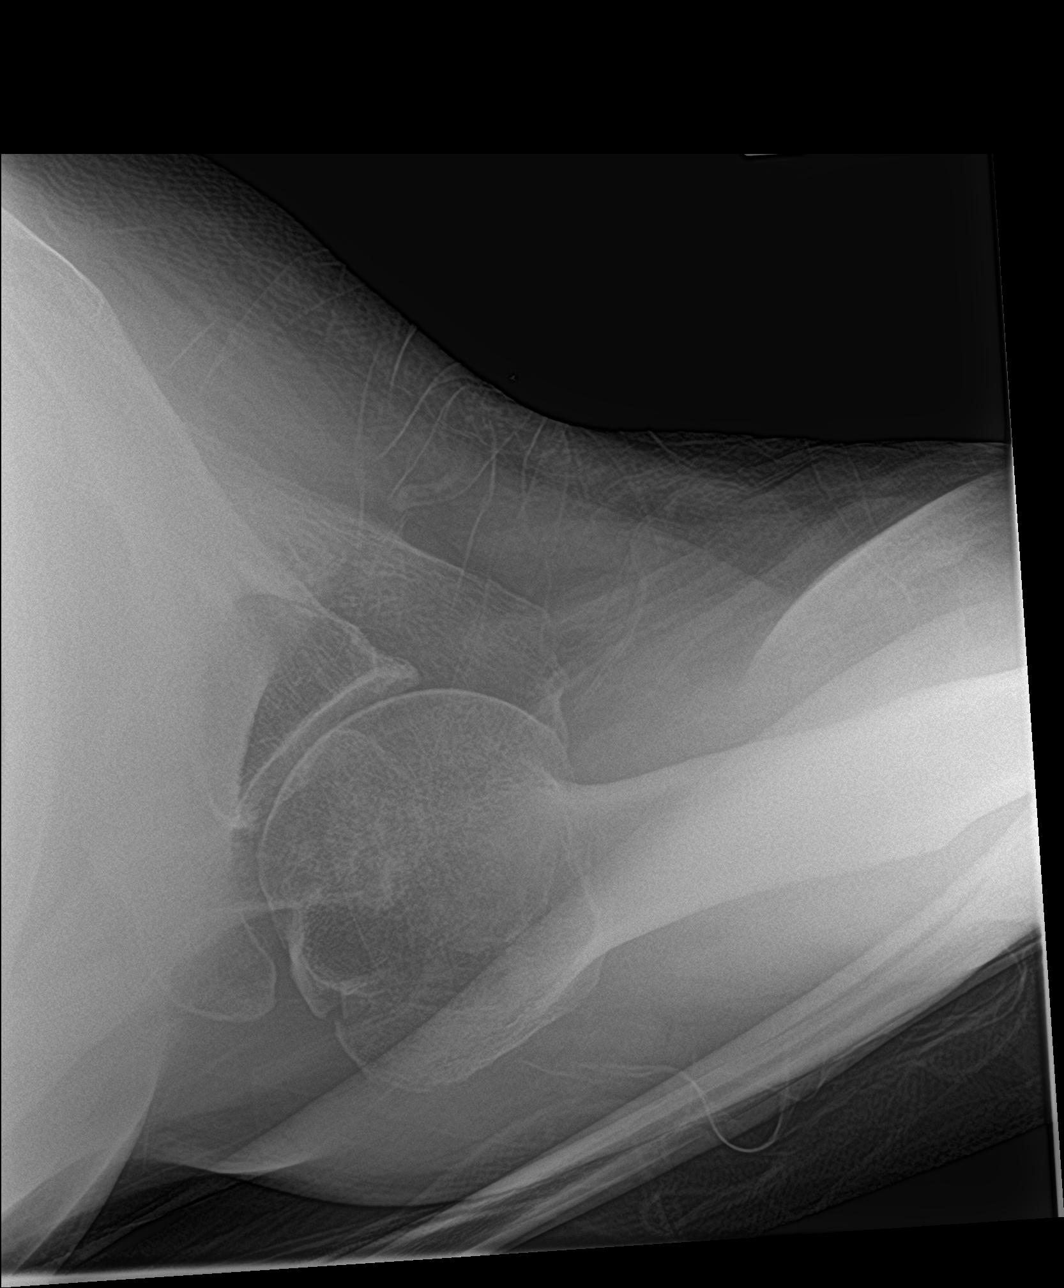

[3 of 3 positions shown; findings below may reference images not displayed]

FINDINGS: Nondisplaced humeral head fracture superolaterally partially
undermining the greater tuberosity. No malalignment of the
glenohumeral or AC joints. AC joint osteoarthritis with undersurface
spurring is noted.
IMPRESSION: Neer category 1 part fracture of the left proximal humerus.

## 2018-04-19 IMAGING — DX DG LUMBAR SPINE COMPLETE 4+V
5 series · 5 of 5 positions shown · non-contrast
Comparison: 01/17/2014

CLINICAL DATA: Fell off ramp, pain in the low back

EXAM:
LUMBAR SPINE - COMPLETE 4+ VIEW

[l-spine ap]
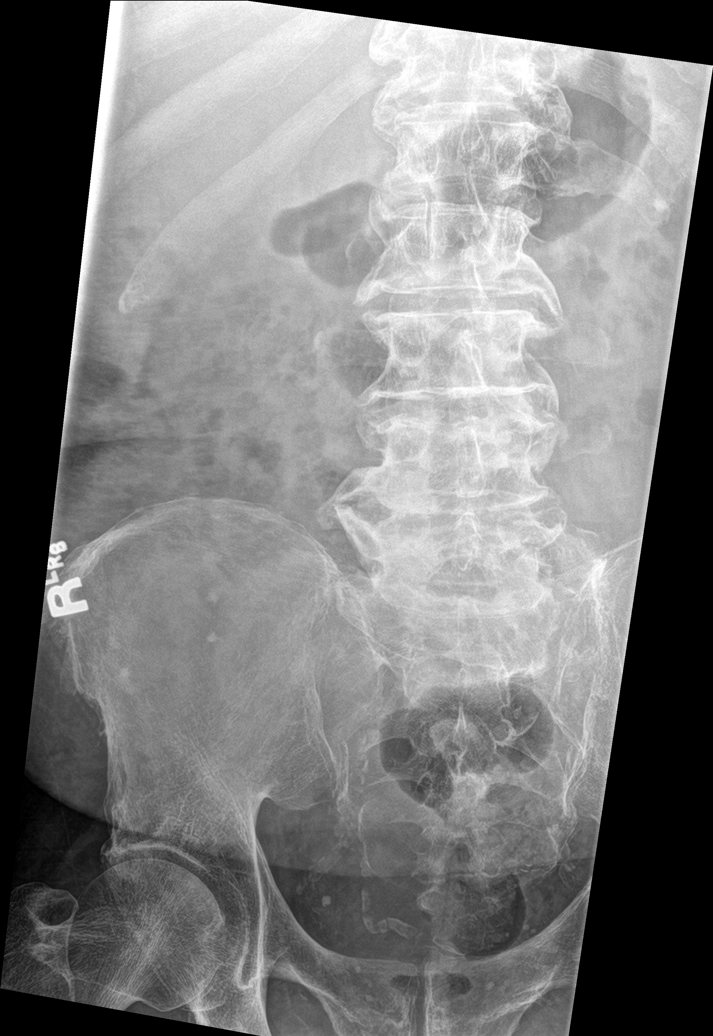

[l-spine obl (1 of 2)]
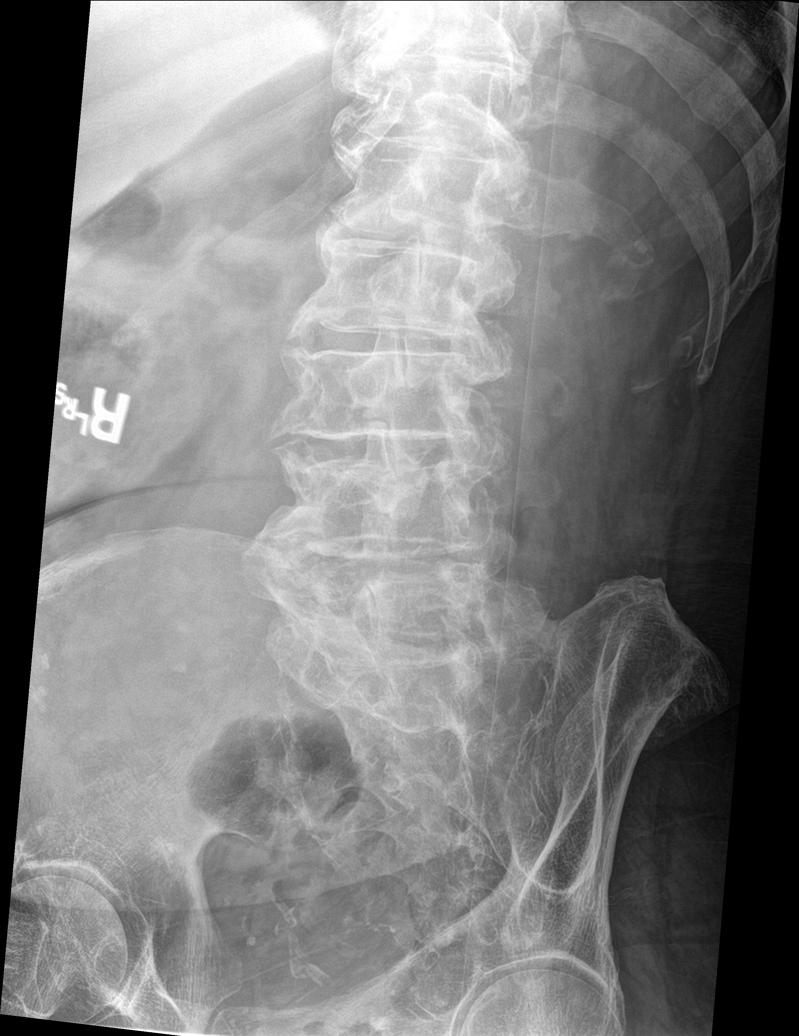

[l-spine obl (2 of 2)]
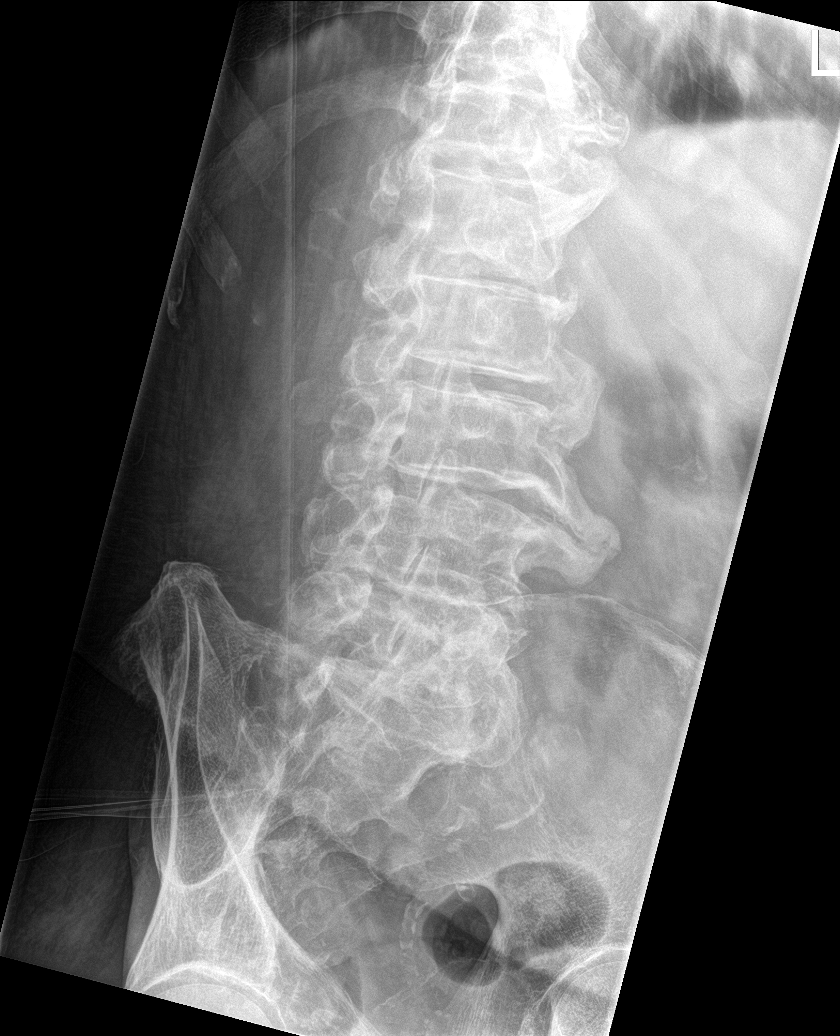

[l-spine lat]
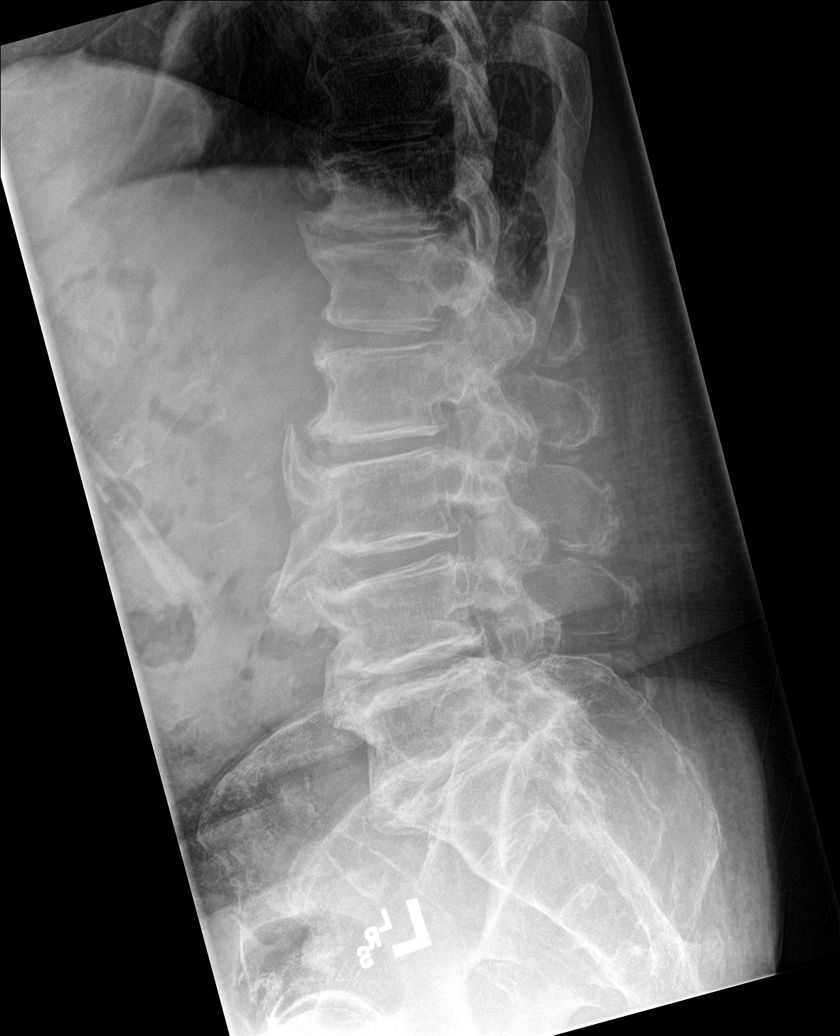

[l-spine spot]
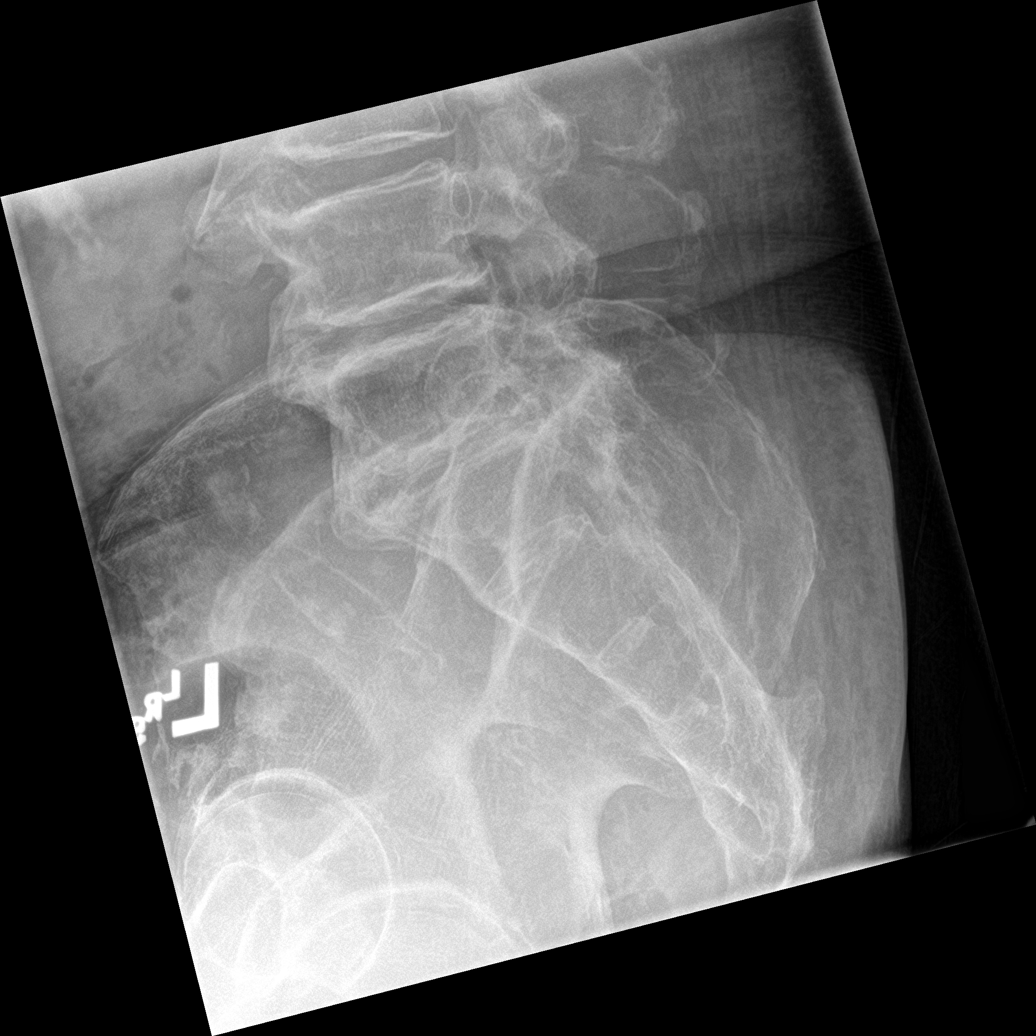

[5 of 5 positions shown; findings below may reference images not displayed]

FINDINGS: Five non rib-bearing lumbar type vertebra. Dense vascular
calcifications in the pelvis. Multiple phleboliths. Mild
straightening of the lumbar spine. Moderate disc space narrowing and
endplate changes at L4-L5 and L5 S1. Large osteophytes from L2
through S1. Minimal anterior vertebral wedging of T12 and L1,
chronic. Facet degenerative changes of the lower lumbar spine.
IMPRESSION: No acute osseous abnormality. Moderate multilevel degenerative disc
changes.
